# Patient Record
Sex: Male | Born: 1980
Health system: Southern US, Community
[De-identification: ages and names within clinical notes are randomized; demographics above are authoritative.]

## PROBLEM LIST (undated history)

## (undated) DIAGNOSIS — A630 Anogenital (venereal) warts: Secondary | ICD-10-CM

## (undated) DIAGNOSIS — Z21 Asymptomatic human immunodeficiency virus [HIV] infection status: Secondary | ICD-10-CM

## (undated) DIAGNOSIS — B977 Papillomavirus as the cause of diseases classified elsewhere: Secondary | ICD-10-CM

## (undated) DIAGNOSIS — B20 Human immunodeficiency virus [HIV] disease: Secondary | ICD-10-CM

## (undated) DIAGNOSIS — E079 Disorder of thyroid, unspecified: Secondary | ICD-10-CM

## (undated) HISTORY — DX: Disorder of thyroid, unspecified: E07.9

## (undated) HISTORY — DX: Papillomavirus as the cause of diseases classified elsewhere: B97.7

## (undated) HISTORY — DX: Human immunodeficiency virus (HIV) disease: B20

## (undated) HISTORY — DX: Asymptomatic human immunodeficiency virus (hiv) infection status: Z21

## (undated) HISTORY — DX: Anogenital (venereal) warts: A63.0

---

## 2015-05-14 ENCOUNTER — Telehealth: Payer: Self-pay

## 2015-05-14 NOTE — Telephone Encounter (Signed)
Patient contacted regarding new intake appointment. Date and time given. Information given regarding documents needed to qualify for financial eligibility.  Tammy K King, RN  

## 2015-05-24 ENCOUNTER — Ambulatory Visit: Payer: Self-pay

## 2015-05-31 ENCOUNTER — Ambulatory Visit: Payer: BLUE CROSS/BLUE SHIELD

## 2015-06-12 ENCOUNTER — Ambulatory Visit: Payer: BLUE CROSS/BLUE SHIELD

## 2015-06-12 ENCOUNTER — Other Ambulatory Visit: Payer: Self-pay

## 2015-06-12 DIAGNOSIS — B59 Pneumocystosis: Secondary | ICD-10-CM

## 2015-06-12 DIAGNOSIS — A539 Syphilis, unspecified: Secondary | ICD-10-CM

## 2015-06-12 DIAGNOSIS — R7989 Other specified abnormal findings of blood chemistry: Secondary | ICD-10-CM

## 2015-06-12 DIAGNOSIS — B20 Human immunodeficiency virus [HIV] disease: Secondary | ICD-10-CM

## 2015-06-12 DIAGNOSIS — R945 Abnormal results of liver function studies: Secondary | ICD-10-CM

## 2015-06-12 DIAGNOSIS — E0590 Thyrotoxicosis, unspecified without thyrotoxic crisis or storm: Secondary | ICD-10-CM

## 2015-06-12 LAB — CBC WITH DIFFERENTIAL/PLATELET
Basophils Absolute: 0 10*3/uL (ref 0.0–0.1)
Basophils Relative: 0 % (ref 0–1)
Eosinophils Absolute: 0.1 10*3/uL (ref 0.0–0.7)
Eosinophils Relative: 4 % (ref 0–5)
HCT: 38.3 % — ABNORMAL LOW (ref 39.0–52.0)
Hemoglobin: 12.5 g/dL — ABNORMAL LOW (ref 13.0–17.0)
Lymphocytes Relative: 38 % (ref 12–46)
Lymphs Abs: 0.8 10*3/uL (ref 0.7–4.0)
MCH: 25.2 pg — ABNORMAL LOW (ref 26.0–34.0)
MCHC: 32.6 g/dL (ref 30.0–36.0)
MCV: 77.2 fL — ABNORMAL LOW (ref 78.0–100.0)
MPV: 10.5 fL (ref 8.6–12.4)
Monocytes Absolute: 0.3 10*3/uL (ref 0.1–1.0)
Monocytes Relative: 13 % — ABNORMAL HIGH (ref 3–12)
Neutro Abs: 0.9 10*3/uL — ABNORMAL LOW (ref 1.7–7.7)
Neutrophils Relative %: 45 % (ref 43–77)
Platelets: 230 10*3/uL (ref 150–400)
RBC: 4.96 MIL/uL (ref 4.22–5.81)
RDW: 16.1 % — ABNORMAL HIGH (ref 11.5–15.5)
WBC: 2.1 10*3/uL — ABNORMAL LOW (ref 4.0–10.5)

## 2015-06-12 LAB — COMPLETE METABOLIC PANEL WITH GFR
ALT: 34 U/L (ref 9–46)
AST: 41 U/L — ABNORMAL HIGH (ref 10–40)
Albumin: 3.5 g/dL — ABNORMAL LOW (ref 3.6–5.1)
Alkaline Phosphatase: 72 U/L (ref 40–115)
BUN: 12 mg/dL (ref 7–25)
CO2: 28 mmol/L (ref 20–31)
Calcium: 8.8 mg/dL (ref 8.6–10.3)
Chloride: 101 mmol/L (ref 98–110)
Creat: 0.79 mg/dL (ref 0.60–1.35)
GFR, Est African American: >89 mL/min (ref >=60)
GFR, Est Non African American: >89 mL/min (ref >=60)
Glucose, Bld: 82 mg/dL (ref 65–99)
Potassium: 4 mmol/L (ref 3.5–5.3)
Sodium: 134 mmol/L — ABNORMAL LOW (ref 135–146)
Total Bilirubin: 0.3 mg/dL (ref 0.2–1.2)
Total Protein: 8.7 g/dL — ABNORMAL HIGH (ref 6.1–8.1)

## 2015-06-12 LAB — LIPID PANEL
Cholesterol: 108 mg/dL — ABNORMAL LOW (ref 125–200)
HDL: 44 mg/dL (ref >=40)
LDL Cholesterol: 54 mg/dL (ref <130)
Total CHOL/HDL Ratio: 2.5 Ratio (ref <=5.0)
Triglycerides: 48 mg/dL (ref <150)
VLDL: 10 mg/dL (ref <30)

## 2015-06-12 MED ORDER — TENOFOVIR DISOPROXIL FUMARATE 300 MG PO TABS: 300.0000 mg | ORAL_TABLET | Freq: Every day | ORAL | Status: DC

## 2015-06-12 MED ORDER — SULFAMETHOXAZOLE-TRIMETHOPRIM 800-160 MG PO TABS: 1.0000 | ORAL_TABLET | Freq: Every day | ORAL | Status: DC

## 2015-06-12 MED ORDER — ABACAVIR SULFATE 300 MG PO TABS: 300.0000 mg | ORAL_TABLET | Freq: Every day | ORAL | Status: DC

## 2015-06-12 MED ORDER — DARUNAVIR ETHANOLATE 800 MG PO TABS: 800.0000 mg | ORAL_TABLET | Freq: Every day | ORAL | Status: DC

## 2015-06-12 MED ORDER — RITONAVIR 100 MG PO TABS: 100.0000 mg | ORAL_TABLET | Freq: Every day | ORAL | Status: DC

## 2015-06-13 ENCOUNTER — Other Ambulatory Visit: Payer: Self-pay

## 2015-06-13 DIAGNOSIS — B20 Human immunodeficiency virus [HIV] disease: Secondary | ICD-10-CM

## 2015-06-13 LAB — URINE CYTOLOGY ANCILLARY ONLY
Chlamydia: NEGATIVE
Neisseria Gonorrhea: NEGATIVE

## 2015-06-13 LAB — RPR TITER: RPR Titer: 1:32 {titer} — AB

## 2015-06-13 LAB — HIV-1 RNA ULTRAQUANT REFLEX TO GENTYP+
HIV 1 RNA Quant: 24011 copies/mL — ABNORMAL HIGH (ref <20)
HIV-1 RNA Quant, Log: 4.38 Log copies/mL — ABNORMAL HIGH (ref <1.30)

## 2015-06-13 LAB — URINALYSIS
Bilirubin Urine: NEGATIVE
Glucose, UA: NEGATIVE
Hgb urine dipstick: NEGATIVE
Ketones, ur: NEGATIVE
Leukocytes, UA: NEGATIVE
Nitrite: NEGATIVE
Protein, ur: NEGATIVE
Specific Gravity, Urine: 1.022 (ref 1.001–1.035)
pH: 6 (ref 5.0–8.0)

## 2015-06-13 LAB — HEPATITIS B CORE ANTIBODY, TOTAL: Hep B Core Total Ab: REACTIVE — AB

## 2015-06-13 LAB — HEPATITIS A ANTIBODY, TOTAL: Hep A Total Ab: NONREACTIVE

## 2015-06-13 LAB — T-HELPER CELL (CD4) - (RCID CLINIC ONLY)
CD4 % Helper T Cell: 3 % — ABNORMAL LOW (ref 33–55)
CD4 T Cell Abs: 20 /uL — ABNORMAL LOW (ref 400–2700)

## 2015-06-13 LAB — HEPATITIS B SURFACE ANTIBODY,QUALITATIVE: Hep B S Ab: NEGATIVE

## 2015-06-13 LAB — HEPATITIS B SURFACE ANTIGEN: Hepatitis B Surface Ag: POSITIVE — AB

## 2015-06-13 LAB — HEPATITIS B SURF AG CONFIRMATION: Hepatitis B Surf Ag Confirmation: POSITIVE — AB

## 2015-06-13 LAB — HEPATITIS C ANTIBODY: HCV Ab: NEGATIVE

## 2015-06-13 LAB — RPR: RPR Ser Ql: REACTIVE — AB

## 2015-06-13 MED ORDER — ABACAVIR SULFATE 300 MG PO TABS: 300.0000 mg | ORAL_TABLET | Freq: Every day | ORAL | Status: DC

## 2015-06-13 NOTE — Addendum Note (Signed)
Addended by: Jennet Maduro D on: 06/13/2015 01:46 PM   Modules accepted: Orders

## 2015-06-13 NOTE — Telephone Encounter (Signed)
Fax received regarding abacavir . Quantity was not noted per request.  Resubmitted via electronic refills.   Laurell Josephs, RN

## 2015-06-14 LAB — QUANTIFERON TB GOLD ASSAY (BLOOD)
Interferon Gamma Release Assay: NEGATIVE
Mitogen value: 1.67 IU/mL
Quantiferon Nil Value: 0.06 IU/mL
Quantiferon Tb Ag Minus Nil Value: 0 IU/mL
TB Ag value: 0.06 IU/mL

## 2015-06-14 LAB — FLUORESCENT TREPONEMAL AB(FTA)-IGG-BLD: Fluorescent Treponemal ABS: REACTIVE — AB

## 2015-06-15 ENCOUNTER — Telehealth: Payer: Self-pay

## 2015-06-15 NOTE — Telephone Encounter (Signed)
Left message for pateint to call the office.   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  Patient will need Bicillin 2.3mil units  IM once weekly for weeks .

## 2015-06-18 ENCOUNTER — Ambulatory Visit (INDEPENDENT_AMBULATORY_CARE_PROVIDER_SITE_OTHER): Payer: BLUE CROSS/BLUE SHIELD | Admitting: *Deleted

## 2015-06-18 DIAGNOSIS — A539 Syphilis, unspecified: Secondary | ICD-10-CM

## 2015-06-18 MED ORDER — PENICILLIN G BENZATHINE 1200000 UNIT/2ML IM SUSP
1.2000 10*6.[IU] | Freq: Once | INTRAMUSCULAR | Status: AC
  Administered 2015-06-18: 1.2 10*6.[IU] via INTRAMUSCULAR

## 2015-06-20 LAB — HIV-1 GENOTYPR PLUS

## 2015-06-20 LAB — HLA B*5701: HLA-B*5701 w/rflx HLA-B High: NEGATIVE

## 2015-06-25 ENCOUNTER — Ambulatory Visit (INDEPENDENT_AMBULATORY_CARE_PROVIDER_SITE_OTHER): Payer: BLUE CROSS/BLUE SHIELD | Admitting: *Deleted

## 2015-06-25 DIAGNOSIS — A539 Syphilis, unspecified: Secondary | ICD-10-CM | POA: Diagnosis not present

## 2015-06-25 MED ORDER — PENICILLIN G BENZATHINE 1200000 UNIT/2ML IM SUSP
1.2000 10*6.[IU] | Freq: Once | INTRAMUSCULAR | Status: AC
  Administered 2015-06-25: 1.2 10*6.[IU] via INTRAMUSCULAR

## 2015-06-27 DIAGNOSIS — R7989 Other specified abnormal findings of blood chemistry: Secondary | ICD-10-CM | POA: Insufficient documentation

## 2015-06-27 DIAGNOSIS — R945 Abnormal results of liver function studies: Secondary | ICD-10-CM | POA: Insufficient documentation

## 2015-06-27 DIAGNOSIS — B59 Pneumocystosis: Secondary | ICD-10-CM | POA: Insufficient documentation

## 2015-06-27 DIAGNOSIS — E0590 Thyrotoxicosis, unspecified without thyrotoxic crisis or storm: Secondary | ICD-10-CM | POA: Insufficient documentation

## 2015-06-27 NOTE — Progress Notes (Signed)
Patient is transferring HIV care from [place]. He was referred by local primary care office.  He has been HIV positive since 2007 and possibly a few different regimens. Patient is not a good historian with medication list or medical history. He was not able to identify his current medication regimen on pill board and was not able to give general dates of treatment or diagnosis date.  He is not able to give a general idea of how long he may have been without medications.   His lack of interest is puzzling.   For a male of his age his responses are very immature.   I have no medical records to confirm his HIV regimen and will complete a medical record release.  The request will be as soon as possible  to verify medications and have documentation of medical history while patient is here.  Sexual history:  Same sex male partners and is versatile with anal intercourse.  1 year: 51 male partners   Lifetime partners: 8   He became sexually active at age 80  He meets some of his partners on [employer]  Internet sites and reports condom use as often.  No history of  sexual intercourse with prostitutes or IV drug use.   Medical records received from Dr Helyn Numbers via fax. Complete medical record to follow at later date.   Last HIV regimen:  Viread, Norvir, Prezista and Ziagen . Records verify history of non compliance with HIV medications possibly due to high co pays with insurance plan.  Last CD4: 81 on January 04, 2015  It is not clear how long he has been without medications.   I have concerns patient may not understand importance of  lab values and the effects this may have on his lively hood. He appears to be in a good mood, free from worries and has no complaints even after I explained lab results.   He is now insured. I have given him co pay cards with simple instruction on how to activate them. I have informed patient to call me if he has any issues with obtaining medications.    Medications sent to pharmacy along with Bactrim DS  He will need a pneumonia vaccine at next visit.   Laurell Josephs, RN

## 2015-07-02 ENCOUNTER — Ambulatory Visit (INDEPENDENT_AMBULATORY_CARE_PROVIDER_SITE_OTHER): Payer: BLUE CROSS/BLUE SHIELD | Admitting: *Deleted

## 2015-07-02 DIAGNOSIS — A539 Syphilis, unspecified: Secondary | ICD-10-CM

## 2015-07-02 MED ORDER — PENICILLIN G BENZATHINE 1200000 UNIT/2ML IM SUSP
1.2000 10*6.[IU] | Freq: Once | INTRAMUSCULAR | Status: AC
  Administered 2015-07-02: 1.2 10*6.[IU] via INTRAMUSCULAR

## 2015-07-12 ENCOUNTER — Encounter: Payer: Self-pay | Admitting: Internal Medicine

## 2015-07-12 ENCOUNTER — Ambulatory Visit (INDEPENDENT_AMBULATORY_CARE_PROVIDER_SITE_OTHER): Payer: BLUE CROSS/BLUE SHIELD | Admitting: Internal Medicine

## 2015-07-12 VITALS — BP 129/81 | HR 77 | Temp 97.4°F | Wt 205.0 lb

## 2015-07-12 DIAGNOSIS — A528 Late syphilis, latent: Secondary | ICD-10-CM

## 2015-07-12 DIAGNOSIS — D649 Anemia, unspecified: Secondary | ICD-10-CM

## 2015-07-12 DIAGNOSIS — B20 Human immunodeficiency virus [HIV] disease: Secondary | ICD-10-CM | POA: Insufficient documentation

## 2015-07-12 DIAGNOSIS — B181 Chronic viral hepatitis B without delta-agent: Secondary | ICD-10-CM | POA: Diagnosis not present

## 2015-07-12 DIAGNOSIS — A6000 Herpesviral infection of urogenital system, unspecified: Secondary | ICD-10-CM | POA: Insufficient documentation

## 2015-07-12 MED ORDER — ELVITEG-COBIC-EMTRICIT-TENOFAF 150-150-200-10 MG PO TABS: 1.0000 | ORAL_TABLET | Freq: Every day | ORAL | Status: DC

## 2015-07-12 MED ORDER — DARUNAVIR ETHANOLATE 800 MG PO TABS: 800.0000 mg | ORAL_TABLET | Freq: Every day | ORAL | Status: DC

## 2015-07-12 MED FILL — GENVOYA TABLET: 150-150-200 | 30 days supply | Qty: 30 | Fill #0

## 2015-07-12 NOTE — Assessment & Plan Note (Addendum)
His HIV infection is not well controlled and he seems somewhat disengaged with his care. I had a long talk with him today about his CD4 count and viral load and the importance of better adherence. A genotype resistance assay done in 2012 revealed extensive NRTI resistance. He met with our ID pharmacist, [facility] today, and we decided to simplify and improve his regimen to [place]. He will continue pneumocystis prophylaxis. I will recheck lab work today and see him back in one month. I have encouraged him to set a goal of not missing any doses in the next month.

## 2015-07-12 NOTE — Assessment & Plan Note (Signed)
I will check his hepatitis B e antigen and antibody and viral load today.

## 2015-07-12 NOTE — Progress Notes (Signed)
HPI: Daniel OldsDemetrius Dunlap is a 35 y.o. male who his here for his HIV care after being tx.   Allergies: No Known Allergies  Vitals:    Past Medical History: Past Medical History  Diagnosis Date  . HIV infection (HCC)   . Thyroid disease     Social History: Social History   Social History  . Marital Status: Single    Spouse Name: N/A  . Number of Children: N/A  . Years of Education: N/A   Social History Main Topics  . Smoking status: Never Smoker   . Smokeless tobacco: None  . Alcohol Use: 0.6 oz/week    1 Glasses of wine per week  . Drug Use: No  . Sexual Activity:    Partners: Female, Male    Birth Control/ Protection: None   Other Topics Concern  . None   Social History Narrative    Previous Regimen:   Current Regimen: TDF/ABC/DRV/r  Labs: HIV 1 RNA QUANT (copies/mL)  Date Value  06/12/2015 24011*   CD4 T CELL ABS (/uL)  Date Value  06/12/2015 20*   HEP B S AB (no units)  Date Value  06/12/2015 NEG   HEPATITIS B SURFACE AG (no units)  Date Value  06/12/2015 POSITIVE*   HCV AB (no units)  Date Value  06/12/2015 NEGATIVE    CrCl: CrCl cannot be calculated (Unknown ideal weight.).  Lipids:    Component Value Date/Time   CHOL 108* 06/12/2015 1128   TRIG 48 06/12/2015 1128   HDL 44 06/12/2015 1128   CHOLHDL 2.5 06/12/2015 1128   VLDL 10 06/12/2015 1128   LDLCALC 54 06/12/2015 1128  HIV Genotype Composite Data Genotype Dates:   Mutations in Bold impact drug susceptibility RT Mutations K70ER, M184V, Y188L  PI Mutations None  Integrase Mutations    Interpretation of Genotype Data per [employer] HIV Database Nucleoside RTIs  abacavir (ABC) Intermediate Resistance zidovudine (AZT) Low-Level Resistance emtricitabine (FTC) High-Level Resistance lamivudine (3TC) High-Level Resistance tenofovir (TDF) Low-Level Resistance   Non-Nucleoside RTIs  efavirenz (EFV) High-Level Resistance etravirine (ETR) Potential Low-Level Resistance nevirapine  (NVP) High-Level Resistance rilpivirine (RPV) High-Level Resistance   Protease Inhibitors     Integrase Inhibitors      Assessment:  [name] is here after he being tx to cont his care for HIV. He has been on ATP in past for his HIV and has had poor adherence to it. He has developed significant mutations listed in the table above. He is currently on ABC/DRV/r/TDF. He also has chronic hep B. I really stressed to him about the limited options we have left to treat his HIV and showed him his current resistance profile. He has been taking ABC 300mg  qday rather than 600mg  qday. I suspected that he has developed full resistance to abacavir now since that genotype was in 2012. After discussing it with Dr. Orvan Falconerampbell, we are going to use Genvoya + DRV due to TAF for his hep B infection.  Recommendations:  Genvoya 1 PO qday Prezista 800mg  PO qday Labs today Rx sent to [facility]  Pham, Minh Quang, PharmD Clinical Infectious Disease Pharmacist [facility] 07/12/2015, 1:56 PM

## 2015-07-12 NOTE — Assessment & Plan Note (Signed)
I talked him about the importance of limiting the number of partners he has, careful partners collection and correct consistent use of condoms.

## 2015-07-12 NOTE — Progress Notes (Signed)
Patient Active Problem List   Diagnosis Date Noted  . HIV disease (Daniel Dunlap) 07/12/2015    Priority: High  . Chronic hepatitis B (Daniel Dunlap) 07/12/2015    Priority: Medium  . Late latent syphilis 07/12/2015  . Normocytic anemia 07/12/2015  . Genital herpes 07/12/2015  . Hyperthyroidism 06/27/2015  . Abnormal liver function test 06/27/2015  . Pneumocystis jiroveci pneumonia (Daniel Dunlap) 06/27/2015    Patient's Medications  New Prescriptions   DARUNAVIR ETHANOLATE (PREZISTA) 800 MG TABLET    Take 1 tablet (800 mg total) by mouth daily with breakfast.   ELVITEGRAVIR-COBICISTAT-EMTRICITABINE-TENOFOVIR (GENVOYA) 150-150-200-10 MG TABS TABLET    Take 1 tablet by mouth daily with breakfast.  Previous Medications   SULFAMETHOXAZOLE-TRIMETHOPRIM (BACTRIM DS,SEPTRA DS) 800-160 MG TABLET    Take 1 tablet by mouth daily.  Modified Medications   No medications on file  Discontinued Medications   ABACAVIR (ZIAGEN) 300 MG TABLET    Take 1 tablet (300 mg total) by mouth daily.   DARUNAVIR ETHANOLATE (PREZISTA) 800 MG TABLET    Take 1 tablet (800 mg total) by mouth daily with breakfast.   RITONAVIR (NORVIR) 100 MG TABS TABLET    Take 1 tablet (100 mg total) by mouth daily with breakfast.   TENOFOVIR (VIREAD) 300 MG TABLET    Take 1 tablet (300 mg total) by mouth daily.    Subjective: Daniel Dunlap is in for his initial visit to establish ongoing care for his HIV infection. He is exclusively day male and was diagnosed about 5 years ago when he developed pneumocystis pneumonia. 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 When he was first diagnosed he was started on Atripla but had to stop it after one year when he was told that it quit working. He recalls missing doses frequently. He was switched to his current regimen of Viread, Ziagen, Prezista and Norvir. He has also had a great deal of difficulty taking  that over the years. He had difficulty related to his pharmacy, co-pay cards, changing jobs and losing insurance. He has insurance with his current job and restarted his medications about one month ago. He normally takes them just before bedtime. He has missed 5 or 6 doses in the past month when he was working a late shift and did not take them. He has not thought of taking a pocket pill container to work with him. He recalls being told that his blood work was improving at the time of his last visit and [place] in September however labs there showed just the opposite. His CD4 count was down to 33 and his viral load was up to 16,600.  He is not aware of having any complications of his HIV infection other than his initial pneumocystis pneumonia. Since learning of his infection he shared the results with his mother and father and one cousin and finds them to be supportive. He has 2 older brothers but has chosen not to tell them. He lives alone.[name] He states that he is currently not in a relationship but he has had 2 male contacts in the past 6 months. He states that they always use condoms. He has a history of syphilis and genital herpes. Records indicate that he has hepatitis B surface antigen positive but records from [place] do not include any other hepatitis B data.  He does not  smoke cigarettes, use other tobacco products or any street drugs. He states that he does drink alcohol but only in moderation.   Review of Systems: Review of Systems  Constitutional: Positive for weight loss. Negative for fever, chills, malaise/fatigue and diaphoresis.  HENT: Negative for sore throat.   Respiratory: Negative for cough, sputum production and shortness of breath.   Cardiovascular: Negative for chest pain.  Gastrointestinal: Negative for nausea, vomiting, abdominal pain and diarrhea.  Genitourinary: Negative for dysuria.  Musculoskeletal: Negative for myalgias and joint pain.  Skin: Negative for  rash.  Neurological: Negative for dizziness.  Psychiatric/Behavioral: Negative for depression and substance abuse. The patient is not nervous/anxious.     Past Medical History  Diagnosis Date  . HIV infection (Daniel Dunlap)   . Thyroid disease     Social History  Substance Use Topics  . Smoking status: Never Smoker   . Smokeless tobacco: Never Used  . Alcohol Use: 0.6 oz/week    1 Glasses of wine per week    No family history on file.  No Known Allergies  Objective:  Filed Vitals:   07/12/15 1411  BP: 129/81  Pulse: 77  Temp: 97.4 F (36.3 C)  TempSrc: Oral  Weight: 205 lb (92.987 kg)   There is no height on file to calculate BMI.  Physical Exam  Constitutional: He is oriented to person, place, and time.  He is well dressed and in no distress.  HENT:  Mouth/Throat: No oropharyngeal exudate.  His teeth are in excellent condition.  Eyes: Conjunctivae are normal.  Cardiovascular: Normal rate and regular rhythm.   No murmur heard. Pulmonary/Chest: Breath sounds normal.  Abdominal: Soft. He exhibits no mass. There is no tenderness.  Musculoskeletal: Normal range of motion.  Neurological: He is alert and oriented to person, place, and time.  Skin: No rash noted.  Psychiatric: Mood and affect normal.    Lab Results Lab Results  Component Value Date   WBC 2.1* 06/12/2015   HGB 12.5* 06/12/2015   HCT 38.3* 06/12/2015   MCV 77.2* 06/12/2015   PLT 230 06/12/2015    Lab Results  Component Value Date   CREATININE 0.79 06/12/2015   BUN 12 06/12/2015   NA 134* 06/12/2015   K 4.0 06/12/2015   CL 101 06/12/2015   CO2 28 06/12/2015    Lab Results  Component Value Date   ALT 34 06/12/2015   AST 41* 06/12/2015   ALKPHOS 72 06/12/2015   BILITOT 0.3 06/12/2015    Lab Results  Component Value Date   CHOL 108* 06/12/2015   HDL 44 06/12/2015   LDLCALC 54 06/12/2015   TRIG 48 06/12/2015   CHOLHDL 2.5 06/12/2015    Lab Results HIV 1 RNA QUANT (copies/mL)  Date  Value  06/12/2015 24011*   CD4 T CELL ABS (/uL)  Date Value  06/12/2015 20*      Problem List Items Addressed This Visit      High   HIV disease (Daniel Dunlap) - Primary    His HIV infection is not well controlled and he seems somewhat disengaged with his care. I had a long talk with him today about his CD4 count and viral load and the importance of better adherence. A genotype resistance assay done in 2012 revealed extensive NRTI resistance. He met with our ID pharmacist, [facility] today, and we decided to simplify and improve his regimen to [place]. He will continue pneumocystis prophylaxis. I will recheck lab work today and see him  back in one month. I have encouraged him to set a goal of not missing any doses in the next month.      Relevant Medications   elvitegravir-cobicistat-emtricitabine-tenofovir (GENVOYA) 150-150-200-10 MG TABS tablet   Darunavir Ethanolate (PREZISTA) 800 MG tablet   Other Relevant Orders   T-helper cell (CD4)- ([facility] clinic only)   HIV 1 RNA quant-no reflex-bld   Hepatitis B DNA, ultraquantitative, PCR   Hepatitis B e antibody   Hepatitis B e antigen     Medium   Chronic hepatitis B ([place])    I will check his hepatitis B e antigen and antibody and viral load today.      Relevant Medications   elvitegravir-cobicistat-emtricitabine-tenofovir (GENVOYA) 150-150-200-10 MG TABS tablet   Darunavir Ethanolate (PREZISTA) 800 MG tablet     Unprioritized   Genital herpes   Relevant Medications   elvitegravir-cobicistat-emtricitabine-tenofovir (GENVOYA) 150-150-200-10 MG TABS tablet   Darunavir Ethanolate (PREZISTA) 800 MG tablet   Late latent syphilis    I talked him about the importance of limiting the number of partners he has, careful partners collection and correct consistent use of condoms.      Relevant Medications   elvitegravir-cobicistat-emtricitabine-tenofovir (GENVOYA) 150-150-200-10 MG TABS tablet   Darunavir Ethanolate (PREZISTA) 800 MG  tablet   Normocytic anemia        Michel Bickers, MD [facility] [phone] pager   [phone] cell 07/12/2015, 5:35 PM

## 2015-07-13 LAB — HIV-1 RNA QUANT-NO REFLEX-BLD
HIV 1 RNA Quant: 23576 copies/mL — ABNORMAL HIGH (ref <20)
HIV-1 RNA Quant, Log: 4.37 Log copies/mL — ABNORMAL HIGH (ref <1.30)

## 2015-07-13 LAB — T-HELPER CELL (CD4) - (RCID CLINIC ONLY)
CD4 % Helper T Cell: 4 % — ABNORMAL LOW (ref 33–55)
CD4 T Cell Abs: 30 /uL — ABNORMAL LOW (ref 400–2700)

## 2015-07-16 LAB — HEPATITIS B E ANTIBODY: Hepatitis Be Antibody: NONREACTIVE

## 2015-07-16 LAB — HEPATITIS B E ANTIGEN: Hepatitis Be Antigen: REACTIVE — AB

## 2015-07-19 LAB — HEPATITIS B DNA, ULTRAQUANTITATIVE, PCR
Hepatitis B DNA (Calc): 6.45 Log IU/mL — ABNORMAL HIGH (ref <1.30)
Hepatitis B DNA: 2842925 IU/mL — ABNORMAL HIGH (ref <20)

## 2015-08-07 MED FILL — GENVOYA TABLET: 150-150-200 | 30 days supply | Qty: 30 | Fill #1

## 2015-08-07 MED FILL — PREZISTA 800 MG TABS: 800 | 30 days supply | Qty: 30 | Fill #0

## 2015-09-05 MED FILL — GENVOYA TABLET: 150-150-200 | 30 days supply | Qty: 30 | Fill #2

## 2015-09-05 MED FILL — PREZISTA 800 MG TABS: 800 | 30 days supply | Qty: 30 | Fill #1

## 2015-10-04 MED FILL — GENVOYA TABLET: 150-150-200 | 30 days supply | Qty: 30 | Fill #3

## 2015-10-04 MED FILL — PREZISTA 800 MG TABS: 800 | 30 days supply | Qty: 30 | Fill #2

## 2015-11-02 MED FILL — PREZISTA 800 MG TABS: 800 | 30 days supply | Qty: 30 | Fill #3

## 2015-11-02 MED FILL — GENVOYA TABLET: 150-150-200 | 30 days supply | Qty: 30 | Fill #4

## 2015-11-13 ENCOUNTER — Other Ambulatory Visit: Payer: BLUE CROSS/BLUE SHIELD

## 2015-11-27 ENCOUNTER — Ambulatory Visit: Payer: BLUE CROSS/BLUE SHIELD | Admitting: Internal Medicine

## 2015-12-03 MED FILL — GENVOYA TABLET: 150-150-200 | 30 days supply | Qty: 30 | Fill #5

## 2016-01-01 ENCOUNTER — Other Ambulatory Visit: Payer: Self-pay | Admitting: Pharmacist Clinician (PhC)/ Clinical Pharmacy Specialist

## 2016-01-01 MED ORDER — ELVITEG-COBIC-EMTRICIT-TENOFAF 150-150-200-10 MG PO TABS
1.0000 | ORAL_TABLET | Freq: Every day | ORAL | 5 refills | Status: DC
Start: 2016-01-01 — End: 2016-07-28

## 2016-01-01 MED ORDER — DARUNAVIR ETHANOLATE 800 MG PO TABS: 800.0000 mg | ORAL_TABLET | Freq: Every day | ORAL | 5 refills | Status: DC

## 2016-01-07 ENCOUNTER — Telehealth: Payer: Self-pay | Admitting: Pharmacy Technician

## 2016-01-07 NOTE — Telephone Encounter (Signed)
He said he will come in to sign [facility] application and bring 2 pay stubs, to get HIV meds until new insurance starts.

## 2016-01-10 ENCOUNTER — Telehealth: Payer: Self-pay | Admitting: Pharmacy Technician

## 2016-01-30 ENCOUNTER — Other Ambulatory Visit: Payer: BLUE CROSS/BLUE SHIELD

## 2016-01-30 DIAGNOSIS — B20 Human immunodeficiency virus [HIV] disease: Secondary | ICD-10-CM

## 2016-01-30 DIAGNOSIS — Z21 Asymptomatic human immunodeficiency virus [HIV] infection status: Secondary | ICD-10-CM

## 2016-01-30 LAB — COMPREHENSIVE METABOLIC PANEL
ALT: 233 U/L — ABNORMAL HIGH (ref 9–46)
AST: 183 U/L — ABNORMAL HIGH (ref 10–40)
Albumin: 3.6 g/dL (ref 3.6–5.1)
Alkaline Phosphatase: 71 U/L (ref 40–115)
BUN: 11 mg/dL (ref 7–25)
CO2: 28 mmol/L (ref 20–31)
Calcium: 8.9 mg/dL (ref 8.6–10.3)
Chloride: 100 mmol/L (ref 98–110)
Creat: 1.02 mg/dL (ref 0.60–1.35)
Glucose, Bld: 99 mg/dL (ref 65–99)
Potassium: 3.8 mmol/L (ref 3.5–5.3)
Sodium: 135 mmol/L (ref 135–146)
Total Bilirubin: 0.4 mg/dL (ref 0.2–1.2)
Total Protein: 8.5 g/dL — ABNORMAL HIGH (ref 6.1–8.1)

## 2016-01-30 MED FILL — GENVOYA TABLET: 150-150-200 | 30 days supply | Qty: 30 | Fill #6

## 2016-01-30 MED FILL — PREZISTA 800 MG TABS: 800 | 30 days supply | Qty: 30 | Fill #4

## 2016-01-31 ENCOUNTER — Telehealth: Payer: Self-pay | Admitting: *Deleted

## 2016-01-31 LAB — CBC WITH DIFFERENTIAL/PLATELET
Basophils Absolute: 0 cells/uL (ref 0–200)
Basophils Relative: 0 %
Eosinophils Absolute: 162 cells/uL (ref 15–500)
Eosinophils Relative: 6 %
HCT: 40.6 % (ref 38.5–50.0)
Hemoglobin: 13.3 g/dL (ref 13.2–17.1)
Lymphocytes Relative: 48 %
Lymphs Abs: 1296 cells/uL (ref 850–3900)
MCH: 27.4 pg (ref 27.0–33.0)
MCHC: 32.8 g/dL (ref 32.0–36.0)
MCV: 83.7 fL (ref 80.0–100.0)
MPV: 11.6 fL (ref 7.5–12.5)
Monocytes Absolute: 891 cells/uL (ref 200–950)
Monocytes Relative: 33 %
Neutro Abs: 351 cells/uL — CL (ref 1500–7800)
Neutrophils Relative %: 13 %
Platelets: 223 10*3/uL (ref 140–400)
RBC: 4.85 MIL/uL (ref 4.20–5.80)
RDW: 15.7 % — ABNORMAL HIGH (ref 11.0–15.0)
WBC: 2.7 10*3/uL — ABNORMAL LOW (ref 3.8–10.8)

## 2016-01-31 LAB — T-HELPER CELL (CD4) - (RCID CLINIC ONLY)
CD4 % Helper T Cell: 7 % — ABNORMAL LOW (ref 33–55)
CD4 T Cell Abs: 100 /uL — ABNORMAL LOW (ref 400–2700)

## 2016-01-31 NOTE — Telephone Encounter (Signed)
Call from [facility] with a critical low Neutro Abs of 351. Please advise

## 2016-01-31 NOTE — Telephone Encounter (Signed)
His neutropenia is unlikely to cause him any problems. I would like to wait and see what his CD4 count is. If it is over 200 I will stop trimethoprim sulfamethoxazole. If it is still below 200 I will change his trimethoprim sulfamethoxazole to one double strength tablet every Monday, Wednesday, Friday.

## 2016-02-01 LAB — HIV-1 RNA QUANT-NO REFLEX-BLD
HIV 1 RNA Quant: <20 copies/mL (ref <20)
HIV-1 RNA Quant, Log: <1.3 Log copies/mL (ref <1.30)

## 2016-02-13 ENCOUNTER — Ambulatory Visit: Payer: BLUE CROSS/BLUE SHIELD | Admitting: Internal Medicine

## 2016-02-19 ENCOUNTER — Ambulatory Visit: Payer: Self-pay | Admitting: Internal Medicine

## 2016-02-26 ENCOUNTER — Ambulatory Visit (INDEPENDENT_AMBULATORY_CARE_PROVIDER_SITE_OTHER): Payer: BLUE CROSS/BLUE SHIELD | Admitting: Internal Medicine

## 2016-02-26 ENCOUNTER — Encounter: Payer: Self-pay | Admitting: Internal Medicine

## 2016-02-26 VITALS — BP 127/83 | HR 64 | Temp 98.6°F | Ht 70.0 in | Wt 223.5 lb

## 2016-02-26 DIAGNOSIS — B181 Chronic viral hepatitis B without delta-agent: Secondary | ICD-10-CM | POA: Diagnosis not present

## 2016-02-26 DIAGNOSIS — Z23 Encounter for immunization: Secondary | ICD-10-CM | POA: Diagnosis not present

## 2016-02-26 DIAGNOSIS — B20 Human immunodeficiency virus [HIV] disease: Secondary | ICD-10-CM

## 2016-02-26 DIAGNOSIS — A528 Late syphilis, latent: Secondary | ICD-10-CM

## 2016-02-26 MED ORDER — SULFAMETHOXAZOLE-TRIMETHOPRIM 800-160 MG PO TABS: 1.0000 | ORAL_TABLET | Freq: Every day | ORAL | 11 refills | Status: DC

## 2016-02-26 MED FILL — SULFAMETHOXAZOLE/TMP DS TAB: 800-160 | 30 days supply | Qty: 30 | Fill #0

## 2016-02-26 NOTE — Assessment & Plan Note (Signed)
He is doing very well with his new antiretroviral regimen and his adherence appears to be very good. His virus is now suppressed to undetectable levels and he started to have some CD4 reconstitution. He does need to restart pneumocystis prophylaxis with trimethoprim sulfamethoxazole. He will continue Genvoya and Prezista and follow-up in 3 months.

## 2016-02-26 NOTE — Assessment & Plan Note (Signed)
He received treatment for late latent syphilis in February. I will repeat an RPR today.

## 2016-02-26 NOTE — Assessment & Plan Note (Signed)
His hepatitis B viral load was elevated at the time of his last visit. He told me at that time that he had been off of his medications and had not restarted taking Viread, Ziagen, Prezista and Norvir one month before his blood work was drawn. I will repeat his hepatitis B viral load again today. I will also check an INR, alpha-fetoprotein and fibrosure assay. He will follow-up in one month.

## 2016-02-26 NOTE — Progress Notes (Signed)
Patient Active Problem List   Diagnosis Date Noted  . HIV disease (HCC) 07/12/2015    Priority: High  . Chronic hepatitis B (HCC) 07/12/2015    Priority: Medium  . Late latent syphilis 07/12/2015  . Normocytic anemia 07/12/2015  . Genital herpes 07/12/2015  . Hyperthyroidism 06/27/2015  . Abnormal liver function test 06/27/2015  . Pneumocystis jiroveci pneumonia (HCC) 06/27/2015    Patient's Medications  New Prescriptions   No medications on file  Previous Medications   DARUNAVIR ETHANOLATE (PREZISTA) 800 MG TABLET    Take 1 tablet (800 mg total) by mouth daily with breakfast.   ELVITEGRAVIR-COBICISTAT-EMTRICITABINE-TENOFOVIR (GENVOYA) 150-150-200-10 MG TABS TABLET    Take 1 tablet by mouth daily with breakfast.  Modified Medications   Modified Medication Previous Medication   SULFAMETHOXAZOLE-TRIMETHOPRIM (BACTRIM DS,SEPTRA DS) 800-160 MG TABLET sulfamethoxazole-trimethoprim (BACTRIM DS,SEPTRA DS) 800-160 MG tablet      Take 1 tablet by mouth daily.    Take 1 tablet by mouth daily.  Discontinued Medications   DARUNAVIR ETHANOLATE (PREZISTA) 800 MG TABLET    Take 1 tablet (800 mg total) by mouth daily with breakfast.   ELVITEGRAVIR-COBICISTAT-EMTRICITABINE-TENOFOVIR (GENVOYA) 150-150-200-10 MG TABS TABLET    Take 1 tablet by mouth daily with breakfast.    Subjective: Daniel Dunlap is in for his first follow-up visit since his initial intake in March. He states that he's had difficulty getting back because he was out of town in [place] working for a while it moved back here in changed jobs. He says that he is doing very well and feeling better. He started on [place] and Prezista after his visit in March. He has had no problems obtaining his medications with the help of his insurance. He took trimethoprim sulfamethoxazole for one month but then his prescription ran out. 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 He does not love his work but enjoys  getting his paycheck. He is not in a relationship and has not been sexually active since his last visit.   Review of Systems: Review of Systems  Constitutional: Negative for chills, diaphoresis, fever, malaise/fatigue and weight loss.  HENT: Negative for sore throat.   Respiratory: Negative for cough, sputum production and shortness of breath.   Cardiovascular: Negative for chest pain.  Gastrointestinal: Negative for abdominal pain, diarrhea, heartburn, nausea and vomiting.  Genitourinary: Negative for dysuria and frequency.  Musculoskeletal: Negative for joint pain and myalgias.  Skin: Negative for rash.  Neurological: Negative for dizziness and headaches.  Psychiatric/Behavioral: Negative for depression and substance abuse. The patient is not nervous/anxious.     Past Medical History:  Diagnosis Date  . HIV infection (HCC)   . Thyroid disease     Social History  Substance Use Topics  . Smoking status: Never Smoker  . Smokeless tobacco: Never Used  . Alcohol use 0.6 oz/week    1 Glasses of wine per week    No family history on file.  No Known Allergies  Objective:  Vitals:   02/26/16 1616  BP: 127/83  Pulse: 64  Temp: 98.6 F (37 C)  TempSrc: Oral  Weight: 223 lb 8 oz (101.4 kg)  Height: 5\' 10"  (1.778 m)   Body mass index is 32.07 kg/m.  Physical Exam  Constitutional: He is oriented to person, place, and time.  He is smiling and in good spirits.  HENT:  Mouth/Throat: No oropharyngeal exudate.  Eyes: Conjunctivae are normal.  Cardiovascular:  Normal rate and regular rhythm.   No murmur heard. Pulmonary/Chest: Effort normal and breath sounds normal. He has no wheezes. He has no rales.  Abdominal: Soft. He exhibits no mass. There is no tenderness.  Musculoskeletal: Normal range of motion. He exhibits no edema or tenderness.  Neurological: He is alert and oriented to person, place, and time. Gait normal.  Skin: No rash noted.  Psychiatric: Mood and affect  normal.    Lab Results Lab Results  Component Value Date   WBC 2.7 (L) 01/30/2016   HGB 13.3 01/30/2016   HCT 40.6 01/30/2016   MCV 83.7 01/30/2016   PLT 223 01/30/2016    Lab Results  Component Value Date   CREATININE 1.02 01/30/2016   BUN 11 01/30/2016   NA 135 01/30/2016   K 3.8 01/30/2016   CL 100 01/30/2016   CO2 28 01/30/2016    Lab Results  Component Value Date   ALT 233 (H) 01/30/2016   AST 183 (H) 01/30/2016   ALKPHOS 71 01/30/2016   BILITOT 0.4 01/30/2016    Lab Results  Component Value Date   CHOL 108 (L) 06/12/2015   HDL 44 06/12/2015   LDLCALC 54 06/12/2015   TRIG 48 06/12/2015   CHOLHDL 2.5 06/12/2015   HIV 1 RNA Quant (copies/mL)  Date Value  01/30/2016 <20  07/12/2015 23,576 (H)  06/12/2015 24,011 (H)   CD4 T Cell Abs (/uL)  Date Value  01/30/2016 100 (L)  07/12/2015 30 (L)  06/12/2015 20 (L)   Hepatitis B DNA viral load 07/12/2015: 1,610,960 Hepatitis B E antigen 07/12/2015: Reactive  RPR 06/12/2015: Reactive at 1:32   Problem List Items Addressed This Visit      High   HIV disease (HCC)    He is doing very well with his new antiretroviral regimen and his adherence appears to be very good. His virus is now suppressed to undetectable levels and he started to have some CD4 reconstitution. He does need to restart pneumocystis prophylaxis with trimethoprim sulfamethoxazole. He will continue Genvoya and Prezista and follow-up in 3 months.      Relevant Medications   sulfamethoxazole-trimethoprim (BACTRIM DS,SEPTRA DS) 800-160 MG tablet     Medium   Chronic hepatitis B (HCC)    His hepatitis B viral load was elevated at the time of his last visit. He told me at that time that he had been off of his medications and had not restarted taking Viread, Ziagen, Prezista and Norvir one month before his blood work was drawn. I will repeat his hepatitis B viral load again today. I will also check an INR, alpha-fetoprotein and fibrosure assay. He will  follow-up in one month.      Relevant Medications   sulfamethoxazole-trimethoprim (BACTRIM DS,SEPTRA DS) 800-160 MG tablet   Other Relevant Orders   Hepatitis B DNA, ultraquantitative, PCR   RPR   INR/PT   Liver Fibrosis Panel   AFP tumor marker     Unprioritized   Late latent syphilis    He received treatment for late latent syphilis in February. I will repeat an RPR today.      Relevant Medications   sulfamethoxazole-trimethoprim (BACTRIM DS,SEPTRA DS) 800-160 MG tablet    Other Visit Diagnoses    Need for prophylactic vaccination against Streptococcus pneumoniae (pneumococcus)    -  Primary   Relevant Orders   Pneumococcal polysaccharide vaccine 23-valent greater than or equal to 2yo subcutaneous/IM (Completed)   Need for prophylactic vaccination and inoculation against viral hepatitis  Relevant Orders   Hepatitis A vaccine adult IM (Completed)   Encounter for immunization       Relevant Orders   Flu Vaccine QUAD 36+ mos IM (Completed)        Cliffton AstersJohn Campbell, MD [facility] [phone] pager   [phone] cell 02/26/2016, 5:36 PM

## 2016-02-27 MED FILL — PREZISTA 800 MG TABS: 800 | 30 days supply | Qty: 30 | Fill #5

## 2016-02-27 MED FILL — GENVOYA TABLET: 150-150-200 | 30 days supply | Qty: 30 | Fill #7

## 2016-03-25 ENCOUNTER — Ambulatory Visit: Payer: BLUE CROSS/BLUE SHIELD | Admitting: Internal Medicine

## 2016-03-27 MED FILL — GENVOYA TABLET: 150-150-200 | 30 days supply | Qty: 30 | Fill #8

## 2016-03-27 MED FILL — PREZISTA 800 MG TABS: 800 | 30 days supply | Qty: 30 | Fill #6

## 2016-03-27 MED FILL — SULFAMETHOXAZOLE/TMP DS TAB: 800-160 | 30 days supply | Qty: 30 | Fill #1

## 2016-04-25 MED FILL — GENVOYA TABLET: 150-150-200 | 30 days supply | Qty: 30 | Fill #9

## 2016-04-25 MED FILL — SULFAMETHOXAZOLE/TMP DS TAB: 800-160 | 30 days supply | Qty: 30 | Fill #2

## 2016-04-25 MED FILL — PREZISTA 800 MG TABS: 800 | 30 days supply | Qty: 30 | Fill #7

## 2016-05-21 MED FILL — SULFAMETHOXAZOLE/TMP DS TAB: 800-160 | 30 days supply | Qty: 30 | Fill #3 | Status: TO

## 2016-05-21 MED FILL — GENVOYA TABLET: 150-150-200 | 30 days supply | Qty: 30 | Fill #10 | Status: TO

## 2016-05-21 MED FILL — PREZISTA 800 MG TABS: 800 | 30 days supply | Qty: 30 | Fill #8 | Status: TO

## 2016-05-22 ENCOUNTER — Encounter: Payer: Self-pay | Admitting: Internal Medicine

## 2016-05-29 ENCOUNTER — Other Ambulatory Visit: Payer: BLUE CROSS/BLUE SHIELD

## 2016-05-29 DIAGNOSIS — B181 Chronic viral hepatitis B without delta-agent: Secondary | ICD-10-CM

## 2016-05-31 LAB — HEPATITIS B DNA, ULTRAQUANTITATIVE, PCR
Hepatitis B DNA (Calc): <1.3 Log IU/mL — ABNORMAL HIGH (ref <1.30)
Hepatitis B DNA: <20 IU/mL — ABNORMAL HIGH (ref <20)

## 2016-06-10 NOTE — Telephone Encounter (Signed)
l °

## 2016-06-12 ENCOUNTER — Encounter: Payer: Self-pay | Admitting: Internal Medicine

## 2016-06-12 ENCOUNTER — Ambulatory Visit: Payer: BLUE CROSS/BLUE SHIELD | Admitting: Internal Medicine

## 2016-07-01 MED FILL — GENVOYA TABLET: 150-150-200 | 30 days supply | Qty: 30 | Fill #0

## 2016-07-01 MED FILL — SULFAMETHOXAZOLE/TMP DS TAB: 800-160 | 30 days supply | Qty: 30 | Fill #0

## 2016-07-01 MED FILL — PREZISTA 800 MG TABS: 800 | 30 days supply | Qty: 30 | Fill #0

## 2016-07-28 ENCOUNTER — Other Ambulatory Visit: Payer: Self-pay | Admitting: Pharmacist Clinician (PhC)/ Clinical Pharmacy Specialist

## 2016-07-28 MED ORDER — DARUNAVIR ETHANOLATE 800 MG PO TABS: 800.0000 mg | ORAL_TABLET | Freq: Every day | ORAL | 1 refills | Status: DC

## 2016-07-28 MED ORDER — ELVITEG-COBIC-EMTRICIT-TENOFAF 150-150-200-10 MG PO TABS: 1.0000 | ORAL_TABLET | Freq: Every day | ORAL | 1 refills | Status: DC

## 2016-07-28 MED FILL — SULFAMETHOXAZOLE/TMP DS TAB: 800-160 | 30 days supply | Qty: 30 | Fill #1

## 2016-07-28 MED FILL — PREZISTA 800 MG TABS: 800 | 30 days supply | Qty: 30 | Fill #0

## 2016-07-28 MED FILL — GENVOYA TABLET: 150-150-200 | 30 days supply | Qty: 30 | Fill #0

## 2016-08-12 ENCOUNTER — Ambulatory Visit (INDEPENDENT_AMBULATORY_CARE_PROVIDER_SITE_OTHER): Payer: BLUE CROSS/BLUE SHIELD | Admitting: Internal Medicine

## 2016-08-12 ENCOUNTER — Encounter: Payer: Self-pay | Admitting: Internal Medicine

## 2016-08-12 VITALS — BP 122/74 | HR 61 | Temp 98.6°F | Ht 70.0 in | Wt 215.0 lb

## 2016-08-12 DIAGNOSIS — B20 Human immunodeficiency virus [HIV] disease: Secondary | ICD-10-CM | POA: Diagnosis not present

## 2016-08-12 DIAGNOSIS — Z23 Encounter for immunization: Secondary | ICD-10-CM | POA: Diagnosis not present

## 2016-08-12 DIAGNOSIS — A528 Late syphilis, latent: Secondary | ICD-10-CM | POA: Diagnosis not present

## 2016-08-12 DIAGNOSIS — Z0000 Encounter for general adult medical examination without abnormal findings: Secondary | ICD-10-CM | POA: Diagnosis not present

## 2016-08-12 DIAGNOSIS — B181 Chronic viral hepatitis B without delta-agent: Secondary | ICD-10-CM

## 2016-08-12 LAB — COMPREHENSIVE METABOLIC PANEL
ALT: 11 U/L (ref 9–46)
AST: 17 U/L (ref 10–40)
Albumin: 3.7 g/dL (ref 3.6–5.1)
Alkaline Phosphatase: 56 U/L (ref 40–115)
BUN: 11 mg/dL (ref 7–25)
CO2: 26 mmol/L (ref 20–31)
Calcium: 9.1 mg/dL (ref 8.6–10.3)
Chloride: 102 mmol/L (ref 98–110)
Creat: 1.13 mg/dL (ref 0.60–1.35)
Glucose, Bld: 81 mg/dL (ref 65–99)
Potassium: 4.1 mmol/L (ref 3.5–5.3)
Sodium: 136 mmol/L (ref 135–146)
Total Bilirubin: 0.5 mg/dL (ref 0.2–1.2)
Total Protein: 7.9 g/dL (ref 6.1–8.1)

## 2016-08-12 LAB — LIPID PANEL
Cholesterol: 138 mg/dL (ref <200)
HDL: 54 mg/dL (ref >40)
LDL Cholesterol: 72 mg/dL (ref <100)
Total CHOL/HDL Ratio: 2.6 Ratio (ref <5.0)
Triglycerides: 62 mg/dL (ref <150)
VLDL: 12 mg/dL (ref <30)

## 2016-08-12 LAB — CBC
HCT: 43 % (ref 38.5–50.0)
Hemoglobin: 14.2 g/dL (ref 13.2–17.1)
MCH: 29.9 pg (ref 27.0–33.0)
MCHC: 33 g/dL (ref 32.0–36.0)
MCV: 90.5 fL (ref 80.0–100.0)
MPV: 10.9 fL (ref 7.5–12.5)
Platelets: 230 10*3/uL (ref 140–400)
RBC: 4.75 MIL/uL (ref 4.20–5.80)
RDW: 14.4 % (ref 11.0–15.0)
WBC: 3.8 10*3/uL (ref 3.8–10.8)

## 2016-08-12 NOTE — Addendum Note (Signed)
Addended by: Andree Coss on: 08/12/2016 10:29 AM   Modules accepted: Orders

## 2016-08-12 NOTE — Assessment & Plan Note (Signed)
His adherence is excellent. I will recheck his hepatitis B viral load.

## 2016-08-12 NOTE — Addendum Note (Signed)
Addended by: Andree Coss on: 08/12/2016 10:44 AM   Modules accepted: Orders

## 2016-08-12 NOTE — Assessment & Plan Note (Signed)
I talked to him again about the importance of careful partners collection in the future if he does become sexually active.

## 2016-08-12 NOTE — Progress Notes (Signed)
Patient Active Problem List   Diagnosis Date Noted  . HIV disease (HCC) 07/12/2015    Priority: High  . Late latent syphilis 07/12/2015    Priority: Medium  . Chronic hepatitis B (HCC) 07/12/2015    Priority: Medium  . Normocytic anemia 07/12/2015  . Genital herpes 07/12/2015  . Hyperthyroidism 06/27/2015  . Abnormal liver function test 06/27/2015  . Pneumocystis jiroveci pneumonia (HCC) 06/27/2015    Patient's Medications  New Prescriptions   No medications on file  Previous Medications   DARUNAVIR (PREZISTA) 800 MG TABLET    Take 1 tablet (800 mg total) by mouth daily with breakfast.   ELVITEGRAVIR-COBICISTAT-EMTRICITABINE-TENOFOVIR (GENVOYA) 150-150-200-10 MG TABS TABLET    Take 1 tablet by mouth daily with breakfast.   SULFAMETHOXAZOLE-TRIMETHOPRIM (BACTRIM DS,SEPTRA DS) 800-160 MG TABLET    Take 1 tablet by mouth daily.  Modified Medications   No medications on file  Discontinued Medications   No medications on file    Subjective:  Daniel Dunlap is in for his first visit in the little over one year. He continues to take Genvoya, Prezista and trimethoprim sulfamethoxazole. He is tolerating them well. He has not missed any doses. 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 He is worried about recent weight gain. He has not been getting any regular exercise. He is currently not in a relationship and has not been sexually active.  Review of Systems: Review of Systems  Constitutional: Negative for weight loss.  Gastrointestinal: Negative for abdominal pain, diarrhea, nausea and vomiting.    Past Medical History:  Diagnosis Date  . HIV infection (HCC)   . Thyroid disease     Social History  Substance Use Topics  . Smoking status: Never Smoker  . Smokeless tobacco: Never Used  . Alcohol use 0.6 oz/week    1 Glasses of wine per week    No family history on file.  No Known Allergies  Objective:  Vitals:   08/12/16 0940  BP: 122/74    Pulse: 61  Temp: 98.6 F (37 C)  TempSrc: Oral  Weight: 215 lb (97.5 kg)  Height:  (1.778 m)   Body mass index is 30.85 kg/m.  Physical Exam  Constitutional: He is oriented to person, place, and time. No distress.  HENT:  Mouth/Throat: No oropharyngeal exudate.  Cardiovascular: Normal rate and regular rhythm.   No murmur heard. Pulmonary/Chest: Effort normal and breath sounds normal.  Abdominal: Soft. There is no tenderness.  Neurological: He is alert and oriented to person, place, and time.  Skin: No rash noted.  Psychiatric: Mood and affect normal.    Lab Results Lab Results  Component Value Date   WBC 2.7 (L) 01/30/2016   HGB 13.3 01/30/2016   HCT 40.6 01/30/2016   MCV 83.7 01/30/2016   PLT 223 01/30/2016    Lab Results  Component Value Date   CREATININE 1.02 01/30/2016   BUN 11 01/30/2016   NA 135 01/30/2016   K 3.8 01/30/2016   CL 100 01/30/2016   CO2 28 01/30/2016    Lab Results  Component Value Date   ALT 233 (H) 01/30/2016   AST 183 (H) 01/30/2016   ALKPHOS 71 01/30/2016   BILITOT 0.4 01/30/2016    Lab Results  Component Value Date   CHOL 108 (L) 06/12/2015   HDL 44 06/12/2015   LDLCALC 54 06/12/2015   TRIG 48 06/12/2015   CHOLHDL 2.5 06/12/2015  HIV 1 RNA Quant (copies/mL)  Date Value  01/30/2016 <20  07/12/2015 23,576 (H)  06/12/2015 24,011 (H)   CD4 T Cell Abs (/uL)  Date Value  01/30/2016 100 (L)  07/12/2015 30 (L)  06/12/2015 20 (L)     Problem List Items Addressed This Visit      High   HIV disease (HCC)    His blood work last October showed that his infection was coming under very good control and he was starting to have CD4 reconstitution. He will continue [employer]. He will get repeat blood work today and follow-up in 6 months.      Relevant Orders   T-helper cell (CD4)- ([facility] clinic only)   HIV 1 RNA quant-no reflex-bld   CBC   Comprehensive metabolic panel   RPR   Lipid panel     Medium   Chronic  hepatitis B (HCC)    His adherence is excellent. I will recheck his hepatitis B viral load.      Relevant Orders   Hepatitis B DNA, ultraquantitative, PCR   Late latent syphilis    I talked to him again about the importance of careful partners collection in the future if he does become sexually active.           Cliffton Asters, MD [facility] [phone] pager   [phone] cell 08/12/2016, 10:23 AM

## 2016-08-12 NOTE — Assessment & Plan Note (Signed)
His blood work last October showed that his infection was coming under very good control and he was starting to have CD4 reconstitution. He will continue [employer]. He will get repeat blood work today and follow-up in 6 months.

## 2016-08-13 LAB — T-HELPER CELL (CD4) - (RCID CLINIC ONLY)
CD4 % Helper T Cell: 11 % — ABNORMAL LOW (ref 33–55)
CD4 T Cell Abs: 130 /uL — ABNORMAL LOW (ref 400–2700)

## 2016-08-14 LAB — HEPATITIS B DNA, ULTRAQUANTITATIVE, PCR
Hepatitis B DNA (Calc): 1.03 Log IU/mL — ABNORMAL HIGH
Hepatitis B DNA: 11 IU/mL — ABNORMAL HIGH

## 2016-08-14 LAB — HIV-1 RNA QUANT-NO REFLEX-BLD
HIV 1 RNA Quant: 24 copies/mL — ABNORMAL HIGH
HIV-1 RNA Quant, Log: 1.38 Log copies/mL — ABNORMAL HIGH

## 2016-08-14 LAB — RPR

## 2016-09-01 MED FILL — PREZISTA 800 MG TABS: 800 | 30 days supply | Qty: 30 | Fill #1

## 2016-09-01 MED FILL — SULFAMETHOXAZOLE/TMP DS TAB: 800-160 | 30 days supply | Qty: 30 | Fill #2

## 2016-09-01 MED FILL — GENVOYA TABLET: 150-150-200 | 30 days supply | Qty: 30 | Fill #1

## 2016-09-05 ENCOUNTER — Ambulatory Visit (INDEPENDENT_AMBULATORY_CARE_PROVIDER_SITE_OTHER): Payer: BLUE CROSS/BLUE SHIELD | Admitting: Family

## 2016-09-05 ENCOUNTER — Encounter: Payer: Self-pay | Admitting: Family

## 2016-09-05 DIAGNOSIS — Z0000 Encounter for general adult medical examination without abnormal findings: Secondary | ICD-10-CM

## 2016-09-05 NOTE — Assessment & Plan Note (Addendum)
1) Anticipatory Guidance: Discussed importance of wearing a seatbelt while driving and not texting while driving; changing batteries in smoke detector at least once annually; wearing suntan lotion when outside; eating a balanced and moderate diet; getting physical activity at least 30 minutes per day.  2) Immunizations / Screenings / Labs:  Declines tetanus. All other immunizations are up-to-date per recommendations. Due for a dental exam encouraged to be completed independently. All other screenings a up-to-date per recommendations. Blood work previously completed and reviewed with patient.  Overall well exam with risk factors for cardiovascular disease including obesity. Recommend weight loss of approximately 5% of current body weight through nutrition and physical activity. Encouraged a nutritional intake that is moderate, balance, and varied. HIV appears adequately controlled and managed by infectious disease. Continue other healthy lifestyle behaviors and choices.follow-up prevention exam in 1 year. Follow-up office visit for chronic conditions as needed.

## 2016-09-05 NOTE — Progress Notes (Signed)
Subjective:    Patient ID: Daniel Dunlap, male    DOB: 01/30/1981, 36 y.o.   MRN: [id number]  Chief Complaint  Patient presents with  . Establish Care    fasting    HPI:  Aldric Dunlap is a 36 y.o. male who presents today for an annual wellness visit.   1) Health Maintenance -   Diet - Averages about 2-3 meals per day consisting of a regular vegetarian diet; Caffeine intake of 2-3 cups per day.   Exercise - No structured exercise    2) Preventative Exams / Immunizations:  Dental -- Due for exam  Vision -- Up to date   Health Maintenance  Topic Date Due  . TETANUS/TDAP  11/28/2001  . INFLUENZA VACCINE  11/26/2016  . HIV Screening  Completed     Immunization History  Administered Date(s) Administered  . Hepatitis A, Adult 02/26/2016, 08/12/2016  . Influenza,inj,Quad PF,36+ Mos 02/26/2016  . Influenza-Unspecified 11/27/2014  . Meningococcal Mcv4o 08/12/2016  . Pneumococcal Polysaccharide-23 02/26/2016  . Td 11/29/1991  . Tdap 11/29/1991     No Known Allergies   Outpatient Medications Prior to Visit  Medication Sig Dispense Refill  . darunavir (PREZISTA) 800 MG tablet Take 1 tablet (800 mg total) by mouth daily with breakfast. 30 tablet 1  . elvitegravir-cobicistat-emtricitabine-tenofovir (GENVOYA) 150-150-200-10 MG TABS tablet Take 1 tablet by mouth daily with breakfast. 30 tablet 1  . sulfamethoxazole-trimethoprim (BACTRIM DS,SEPTRA DS) 800-160 MG tablet Take 1 tablet by mouth daily. 30 tablet 11   No facility-administered medications prior to visit.      Past Medical History:  Diagnosis Date  . Genital warts   . HIV infection (HCC)   . HPV (human papilloma virus) infection   . Thyroid disease      History reviewed. No pertinent surgical history.   Family History  Problem Relation Age of Onset  . Healthy Mother   . Healthy Father      Social History   Social History  . Marital status: Single    Spouse name: N/A  . Number of  children: 0  . Years of education: 53   Occupational History  . Not on file.   Social History Main Topics  . Smoking status: Never Smoker  . Smokeless tobacco: Never Used  . Alcohol use 0.6 oz/week    1 Glasses of wine per week  . Drug use: No  . Sexual activity: Yes    Partners: Female, Male    Birth control/ protection: None   Other Topics Concern  . Not on file   Social History Narrative   Fun: Play music, listen to music.       Review of Systems  Constitutional: Denies fever, chills, fatigue, or significant weight gain/loss. HENT: Head: Denies headache or neck pain Ears: Denies changes in hearing, ringing in ears, earache, drainage Nose: Denies discharge, stuffiness, itching, nosebleed, sinus pain Throat: Denies sore throat, hoarseness, dry mouth, sores, thrush Eyes: Denies loss/changes in vision, pain, redness, blurry/double vision, flashing lights Cardiovascular: Denies chest pain/discomfort, tightness, palpitations, shortness of breath with activity, difficulty lying down, swelling, sudden awakening with shortness of breath Respiratory: Denies shortness of breath, cough, sputum production, wheezing Gastrointestinal: Denies dysphasia, heartburn, change in appetite, nausea, change in bowel habits, rectal bleeding, constipation, diarrhea, yellow skin or eyes Genitourinary: Denies frequency, urgency, burning/pain, blood in urine, incontinence, change in urinary strength. Musculoskeletal: Denies muscle/joint pain, stiffness, back pain, redness or swelling of joints, trauma Skin: Denies rashes, lumps, itching, dryness,  color changes, or hair/nail changes Neurological: Denies dizziness, fainting, seizures, weakness, numbness, tingling, tremor Psychiatric - Denies nervousness, stress, depression or memory loss Endocrine: Denies heat or cold intolerance, sweating, frequent urination, excessive thirst, changes in appetite Hematologic: Denies ease of bruising or bleeding       Objective:     BP 110/82 (BP Location: Left Arm, Patient Position: Sitting, Cuff Size: Large)   Pulse 70   Temp 98.2 F (36.8 C) (Oral)   Resp 16   Ht 5\' 10"  (1.778 m)   Wt 218 lb 1.9 oz (98.9 kg)   SpO2 98%   BMI 31.30 kg/m  Nursing note and vital signs reviewed.  Physical Exam  Constitutional: He is oriented to person, place, and time. He appears well-developed and well-nourished.  HENT:  Head: Normocephalic.  Right Ear: Hearing, tympanic membrane, external ear and ear canal normal.  Left Ear: Hearing, tympanic membrane, external ear and ear canal normal.  Nose: Nose normal.  Mouth/Throat: Uvula is midline, oropharynx is clear and moist and mucous membranes are normal.  Eyes: Conjunctivae and EOM are normal. Pupils are equal, round, and reactive to light.  Neck: Neck supple. No JVD present. No tracheal deviation present. No thyromegaly present.  Cardiovascular: Normal rate, regular rhythm, normal heart sounds and intact distal pulses.   Pulmonary/Chest: Effort normal and breath sounds normal.  Abdominal: Soft. Bowel sounds are normal. He exhibits no distension and no mass. There is no tenderness. There is no rebound and no guarding.  Musculoskeletal: Normal range of motion. He exhibits no edema or tenderness.  Lymphadenopathy:    He has no cervical adenopathy.  Neurological: He is alert and oriented to person, place, and time. He has normal reflexes. No cranial nerve deficit. He exhibits normal muscle tone. Coordination normal.  Skin: Skin is warm and dry.  Psychiatric: He has a normal mood and affect. His behavior is normal. Judgment and thought content normal.       Assessment & Plan:   Problem List Items Addressed This Visit      Other   Routine adult health maintenance    1) Anticipatory Guidance: Discussed importance of wearing a seatbelt while driving and not texting while driving; changing batteries in smoke detector at least once annually; wearing suntan lotion  when outside; eating a balanced and moderate diet; getting physical activity at least 30 minutes per day.  2) Immunizations / Screenings / Labs:  Declines tetanus. All other immunizations are up-to-date per recommendations. Due for a dental exam encouraged to be completed independently. All other screenings a up-to-date per recommendations. Blood work previously completed and reviewed with patient.  Overall well exam with risk factors for cardiovascular disease including obesity. Recommend weight loss of approximately 5% of current body weight through nutrition and physical activity. Encouraged a nutritional intake that is moderate, balance, and varied. HIV appears adequately controlled and managed by infectious disease. Continue other healthy lifestyle behaviors and choices.follow-up prevention exam in 1 year. Follow-up office visit for chronic conditions as needed.           I am having Mr. Sargent maintain his sulfamethoxazole-trimethoprim, elvitegravir-cobicistat-emtricitabine-tenofovir, and darunavir.   Follow-up: Return in about 6 months (around 03/08/2017), or if symptoms worsen or fail to improve.   Jeanine Luzalone, Gregory, FNP

## 2016-09-05 NOTE — Patient Instructions (Signed)
Thank you for choosing [facility].  SUMMARY AND INSTRUCTIONS:  Medication:  Please continue to take your medications as prescribed.   Follow up:  If your symptoms worsen or fail to improve, please contact our office for further instruction, or in case of emergency go directly to the emergency room at the closest medical facility.     Health Maintenance, Male A healthy lifestyle and preventive care is important for your health and wellness. Ask your health care provider about what schedule of regular examinations is right for you. What should I know about weight and diet?  Eat a Healthy Diet  Eat plenty of vegetables, fruits, whole grains, low-fat dairy products, and lean protein.  Do not eat a lot of foods high in solid fats, added sugars, or salt. Maintain a Healthy Weight  Regular exercise can help you achieve or maintain a healthy weight. You should:  Do at least 150 minutes of exercise each week. The exercise should increase your heart rate and make you sweat (moderate-intensity exercise).  Do strength-training exercises at least twice a week. Watch Your Levels of Cholesterol and Blood Lipids  Have your blood tested for lipids and cholesterol every 5 years starting at 36 years of age. If you are at high risk for heart disease, you should start having your blood tested when you are 36 years old. You may need to have your cholesterol levels checked more often if:  Your lipid or cholesterol levels are high.  You are older than 36 years of age.  You are at high risk for heart disease. What should I know about cancer screening? Many types of cancers can be detected early and may often be prevented. Lung Cancer  You should be screened every year for lung cancer if:  You are a current smoker who has smoked for at least 30 years.  You are a former smoker who has quit within the past 15 years.  Talk to your health care provider about your screening options, when you  should start screening, and how often you should be screened. Colorectal Cancer  Routine colorectal cancer screening usually begins at 36 years of age and should be repeated every 5-10 years until you are 36 years old. You may need to be screened more often if early forms of precancerous polyps or small growths are found. Your health care provider may recommend screening at an earlier age if you have risk factors for colon cancer.  Your health care provider may recommend using home test kits to check for hidden blood in the stool.  A small camera at the end of a tube can be used to examine your colon (sigmoidoscopy or colonoscopy). This checks for the earliest forms of colorectal cancer. Prostate and Testicular Cancer  Depending on your age and overall health, your health care provider may do certain tests to screen for prostate and testicular cancer.  Talk to your health care provider about any symptoms or concerns you have about testicular or prostate cancer. Skin Cancer  Check your skin from head to toe regularly.  Tell your health care provider about any new moles or changes in moles, especially if:  There is a change in a mole's size, shape, or color.  You have a mole that is larger than a pencil eraser.  Always use sunscreen. Apply sunscreen liberally and repeat throughout the day.  Protect yourself by wearing long sleeves, pants, a wide-brimmed hat, and sunglasses when outside. What should I know about heart disease,  diabetes, and high blood pressure?  If you are 5-47 years of age, have your blood pressure checked every 3-5 years. If you are 35 years of age or older, have your blood pressure checked every year. You should have your blood pressure measured twice-once when you are at a hospital or clinic, and once when you are not at a hospital or clinic. Record the average of the two measurements. To check your blood pressure when you are not at a hospital or clinic, you can  use:  An automated blood pressure machine at a pharmacy.  A home blood pressure monitor.  Talk to your health care provider about your target blood pressure.  If you are between 64-65 years old, ask your health care provider if you should take aspirin to prevent heart disease.  Have regular diabetes screenings by checking your fasting blood sugar level.  If you are at a normal weight and have a low risk for diabetes, have this test once every three years after the age of 35.  If you are overweight and have a high risk for diabetes, consider being tested at a younger age or more often.  A one-time screening for abdominal aortic aneurysm (AAA) by ultrasound is recommended for men aged 65-75 years who are current or former smokers. What should I know about preventing infection? Hepatitis B  If you have a higher risk for hepatitis B, you should be screened for this virus. Talk with your health care provider to find out if you are at risk for hepatitis B infection. Hepatitis C  Blood testing is recommended for:  Everyone born from [birth year] through 1965.  Anyone with known risk factors for hepatitis C. Sexually Transmitted Diseases (STDs)  You should be screened each year for STDs including gonorrhea and chlamydia if:  You are sexually active and are younger than 36 years of age.  You are older than 37 years of age and your health care provider tells you that you are at risk for this type of infection.  Your sexual activity has changed since you were last screened and you are at an increased risk for chlamydia or gonorrhea. Ask your health care provider if you are at risk.  Talk with your health care provider about whether you are at high risk of being infected with HIV. Your health care provider may recommend a prescription medicine to help prevent HIV infection. What else can I do?  Schedule regular health, dental, and eye exams.  Stay current with your vaccines  (immunizations).  Do not use any tobacco products, such as cigarettes, chewing tobacco, and e-cigarettes. If you need help quitting, ask your health care provider.  Limit alcohol intake to no more than 2 drinks per day. One drink equals 12 ounces of beer, 5 ounces of wine, or 1 ounces of hard liquor.  Do not use street drugs.  Do not share needles.  Ask your health care provider for help if you need support or information about quitting drugs.  Tell your health care provider if you often feel depressed.  Tell your health care provider if you have ever been abused or do not feel safe at home. This information is not intended to replace advice given to you by your health care provider. Make sure you discuss any questions you have with your health care provider. Document Released: 10/11/2007 Document Revised: 12/12/2015 Document Reviewed: 01/16/2015 Elsevier Interactive Patient Education  2017 [employer].

## 2016-10-01 ENCOUNTER — Other Ambulatory Visit: Payer: Self-pay | Admitting: Pharmacist

## 2016-10-01 DIAGNOSIS — B20 Human immunodeficiency virus [HIV] disease: Secondary | ICD-10-CM

## 2016-10-01 MED ORDER — DARUNAVIR ETHANOLATE 800 MG PO TABS: 800.0000 mg | ORAL_TABLET | Freq: Every day | ORAL | 11 refills | Status: DC

## 2016-10-01 MED ORDER — ELVITEG-COBIC-EMTRICIT-TENOFAF 150-150-200-10 MG PO TABS: 1.0000 | ORAL_TABLET | Freq: Every day | ORAL | 11 refills | Status: DC

## 2016-10-01 MED FILL — GENVOYA TABLET: 150-150-200 | 30 days supply | Qty: 30 | Fill #0

## 2016-10-01 MED FILL — SULFAMETHOXAZOLE/TMP DS TAB: 800-160 | 30 days supply | Qty: 30 | Fill #3

## 2016-10-01 MED FILL — PREZISTA 800 MG TABS: 800 | 30 days supply | Qty: 30 | Fill #0

## 2016-11-11 MED FILL — PREZISTA 800 MG TABS: 800 | 30 days supply | Qty: 30 | Fill #1

## 2016-11-11 MED FILL — GENVOYA TABLET: 150-150-200 | 30 days supply | Qty: 30 | Fill #1

## 2016-11-11 MED FILL — SULFAMETHOXAZOLE/TMP DS TAB: 800-160 | 30 days supply | Qty: 30 | Fill #4

## 2016-12-03 MED FILL — GENVOYA TABLET: 150-150-200 | 30 days supply | Qty: 30 | Fill #2

## 2016-12-03 MED FILL — PREZISTA 800 MG TABS: 800 | 30 days supply | Qty: 30 | Fill #2

## 2016-12-26 MED FILL — GENVOYA TABLET: 150-150-200 | 30 days supply | Qty: 30 | Fill #3

## 2016-12-26 MED FILL — PREZISTA 800 MG TABS: 800 | 30 days supply | Qty: 30 | Fill #3

## 2017-02-12 ENCOUNTER — Encounter: Payer: Self-pay | Admitting: Internal Medicine

## 2017-02-16 ENCOUNTER — Ambulatory Visit: Payer: Self-pay

## 2017-02-17 ENCOUNTER — Encounter: Payer: Self-pay | Admitting: Internal Medicine

## 2017-03-23 ENCOUNTER — Encounter: Payer: Self-pay | Admitting: *Deleted

## 2017-03-23 ENCOUNTER — Other Ambulatory Visit: Payer: Self-pay | Admitting: Pharmacist

## 2017-03-23 ENCOUNTER — Encounter: Payer: Self-pay | Admitting: Internal Medicine

## 2017-03-23 ENCOUNTER — Other Ambulatory Visit: Payer: Self-pay | Admitting: *Deleted

## 2017-03-23 ENCOUNTER — Telehealth: Payer: Self-pay | Admitting: *Deleted

## 2017-03-23 DIAGNOSIS — B20 Human immunodeficiency virus [HIV] disease: Secondary | ICD-10-CM

## 2017-03-23 MED ORDER — ELVITEG-COBIC-EMTRICIT-TENOFAF 150-150-200-10 MG PO TABS: 1.0000 | ORAL_TABLET | Freq: Every day | ORAL | 3 refills | Status: DC

## 2017-03-23 MED ORDER — DARUNAVIR ETHANOLATE 800 MG PO TABS: 800.0000 mg | ORAL_TABLET | Freq: Every day | ORAL | 3 refills | Status: DC

## 2017-03-23 MED ORDER — SULFAMETHOXAZOLE-TRIMETHOPRIM 800-160 MG PO TABS: 1.0000 | ORAL_TABLET | Freq: Every day | ORAL | 11 refills | Status: DC

## 2017-03-23 MED ORDER — ELVITEG-COBIC-EMTRICIT-TENOFAF 150-150-200-10 MG PO TABS: 1.0000 | ORAL_TABLET | Freq: Every day | ORAL | 11 refills | Status: DC

## 2017-03-23 MED ORDER — DARUNAVIR ETHANOLATE 800 MG PO TABS: 800.0000 mg | ORAL_TABLET | Freq: Every day | ORAL | 11 refills | Status: DC

## 2017-03-23 MED ORDER — SULFAMETHOXAZOLE-TRIMETHOPRIM 800-160 MG PO TABS: 1.0000 | ORAL_TABLET | Freq: Every day | ORAL | 3 refills | Status: DC

## 2017-03-23 NOTE — Telephone Encounter (Signed)
Patient called on MD line stating he needed his meds. Did not provide his name. Pulled him up in Epic and asked if he was insured. [facility] I said I would call [facility] and call him back shortly. He stated that is not helpful and he can not receive calls. He said he would call back whenever and hung up the phone. Spoke to Dover CorporationCassie, pharmacist and she was able to see that ADAP has been approved and she sent the medications to [facility].

## 2017-08-06 ENCOUNTER — Other Ambulatory Visit: Payer: Self-pay | Admitting: *Deleted

## 2017-08-06 ENCOUNTER — Other Ambulatory Visit (HOSPITAL_COMMUNITY)
Admission: RE | Admit: 2017-08-06 | Discharge: 2017-08-06 | Disposition: A | Discharge status: Home / Self Care | Payer: BLUE CROSS/BLUE SHIELD | Source: Ambulatory Visit | Attending: Internal Medicine | Admitting: Internal Medicine

## 2017-08-06 ENCOUNTER — Encounter: Payer: Self-pay | Admitting: Internal Medicine

## 2017-08-06 ENCOUNTER — Other Ambulatory Visit: Payer: BLUE CROSS/BLUE SHIELD

## 2017-08-06 DIAGNOSIS — Z79899 Other long term (current) drug therapy: Secondary | ICD-10-CM | POA: Diagnosis not present

## 2017-08-06 DIAGNOSIS — B20 Human immunodeficiency virus [HIV] disease: Secondary | ICD-10-CM | POA: Insufficient documentation

## 2017-08-06 DIAGNOSIS — Z113 Encounter for screening for infections with a predominantly sexual mode of transmission: Secondary | ICD-10-CM | POA: Diagnosis not present

## 2017-08-06 MED ORDER — ELVITEG-COBIC-EMTRICIT-TENOFAF 150-150-200-10 MG PO TABS: 1.0000 | ORAL_TABLET | Freq: Every day | ORAL | 0 refills | Status: DC

## 2017-08-06 MED ORDER — DARUNAVIR ETHANOLATE 800 MG PO TABS: 800.0000 mg | ORAL_TABLET | Freq: Every day | ORAL | 0 refills | Status: DC

## 2017-08-06 MED ORDER — SULFAMETHOXAZOLE-TRIMETHOPRIM 800-160 MG PO TABS: 1.0000 | ORAL_TABLET | Freq: Every day | ORAL | 0 refills | Status: DC

## 2017-08-06 NOTE — Progress Notes (Signed)
Patient in for labs, has new insurance. He is not taking medications correctly, just restarted and needs refills.  RN sent 30 day refill to [facility]. Patient will need to keep his follow up appointment for future refills. Andree CossHowell, Michelle M, RN

## 2017-08-07 LAB — COMPLETE METABOLIC PANEL WITH GFR
AG Ratio: 0.8 (calc) — ABNORMAL LOW (ref 1.0–2.5)
ALT: 17 U/L (ref 9–46)
AST: 23 U/L (ref 10–40)
Albumin: 3.6 g/dL (ref 3.6–5.1)
Alkaline phosphatase (APISO): 98 U/L (ref 40–115)
BUN/Creatinine Ratio: 28 (calc) — ABNORMAL HIGH (ref 6–22)
BUN: 16 mg/dL (ref 7–25)
CO2: 26 mmol/L (ref 20–32)
Calcium: 9.2 mg/dL (ref 8.6–10.3)
Chloride: 103 mmol/L (ref 98–110)
Creat: 0.58 mg/dL — ABNORMAL LOW (ref 0.60–1.35)
GFR, Est African American: 152 mL/min/{1.73_m2} (ref >=60)
GFR, Est Non African American: 131 mL/min/{1.73_m2} (ref >=60)
Globulin: 4.6 g/dL (calc) — ABNORMAL HIGH (ref 1.9–3.7)
Glucose, Bld: 101 mg/dL — ABNORMAL HIGH (ref 65–99)
Potassium: 3.9 mmol/L (ref 3.5–5.3)
Sodium: 136 mmol/L (ref 135–146)
Total Bilirubin: 0.4 mg/dL (ref 0.2–1.2)
Total Protein: 8.2 g/dL — ABNORMAL HIGH (ref 6.1–8.1)

## 2017-08-07 LAB — CBC WITH DIFFERENTIAL/PLATELET
Basophils Absolute: 9 cells/uL (ref 0–200)
Basophils Relative: 0.2 %
Eosinophils Absolute: 61 cells/uL (ref 15–500)
Eosinophils Relative: 1.3 %
HCT: 32.7 % — ABNORMAL LOW (ref 38.5–50.0)
Hemoglobin: 10.7 g/dL — ABNORMAL LOW (ref 13.2–17.1)
Lymphs Abs: 2195 cells/uL (ref 850–3900)
MCH: 25 pg — ABNORMAL LOW (ref 27.0–33.0)
MCHC: 32.7 g/dL (ref 32.0–36.0)
MCV: 76.4 fL — ABNORMAL LOW (ref 80.0–100.0)
MPV: 12.6 fL — ABNORMAL HIGH (ref 7.5–12.5)
Monocytes Relative: 13.3 %
Neutro Abs: 1810 cells/uL (ref 1500–7800)
Neutrophils Relative %: 38.5 %
Platelets: 183 10*3/uL (ref 140–400)
RBC: 4.28 10*6/uL (ref 4.20–5.80)
RDW: 14.1 % (ref 11.0–15.0)
Total Lymphocyte: 46.7 %
WBC mixed population: 625 cells/uL (ref 200–950)
WBC: 4.7 10*3/uL (ref 3.8–10.8)

## 2017-08-07 LAB — LIPID PANEL
Cholesterol: 77 mg/dL (ref <200)
HDL: 37 mg/dL — ABNORMAL LOW (ref >40)
LDL Cholesterol (Calc): 23 mg/dL (calc)
Non-HDL Cholesterol (Calc): 40 mg/dL (calc) (ref <130)
Total CHOL/HDL Ratio: 2.1 (calc) (ref <5.0)
Triglycerides: 88 mg/dL (ref <150)

## 2017-08-07 LAB — URINE CYTOLOGY ANCILLARY ONLY
Chlamydia: NEGATIVE
Neisseria Gonorrhea: NEGATIVE

## 2017-08-07 LAB — T-HELPER CELL (CD4) - (RCID CLINIC ONLY)
CD4 % Helper T Cell: 10 % — ABNORMAL LOW (ref 33–55)
CD4 T Cell Abs: 200 /uL — ABNORMAL LOW (ref 400–2700)

## 2017-08-07 LAB — RPR: RPR Ser Ql: NONREACTIVE

## 2017-08-10 LAB — HIV-1 RNA QUANT-NO REFLEX-BLD
HIV 1 RNA Quant: 96500 copies/mL — ABNORMAL HIGH
HIV-1 RNA Quant, Log: 4.98 Log copies/mL — ABNORMAL HIGH

## 2017-08-19 ENCOUNTER — Encounter: Payer: Self-pay | Admitting: Family

## 2017-08-19 ENCOUNTER — Ambulatory Visit (INDEPENDENT_AMBULATORY_CARE_PROVIDER_SITE_OTHER): Payer: BLUE CROSS/BLUE SHIELD | Admitting: Family

## 2017-08-19 VITALS — BP 131/79 | HR 93 | Temp 98.5°F | Ht 70.0 in | Wt 188.0 lb

## 2017-08-19 DIAGNOSIS — B20 Human immunodeficiency virus [HIV] disease: Secondary | ICD-10-CM | POA: Diagnosis not present

## 2017-08-19 MED ORDER — SULFAMETHOXAZOLE-TRIMETHOPRIM 800-160 MG PO TABS: 1.0000 | ORAL_TABLET | Freq: Every day | ORAL | 0 refills | Status: DC

## 2017-08-19 MED ORDER — ELVITEG-COBIC-EMTRICIT-TENOFAF 150-150-200-10 MG PO TABS: 1.0000 | ORAL_TABLET | Freq: Every day | ORAL | 0 refills | Status: DC

## 2017-08-19 MED ORDER — DARUNAVIR ETHANOLATE 800 MG PO TABS: 800.0000 mg | ORAL_TABLET | Freq: Every day | ORAL | 0 refills | Status: DC

## 2017-08-19 NOTE — Patient Instructions (Signed)
Good to see you!  We will check your blood work today.  Plan to follow up with myself or Dr. Orvan Falconerampbell.  Continue to take the Genvoya and Prezista.

## 2017-08-19 NOTE — Progress Notes (Signed)
Subjective:    Patient ID: Daniel Dunlap, male    DOB: 03/12/1981, 37 y.o.   MRN: [id number]  Chief Complaint  Patient presents with  . Follow-up    inconsitent with taking medication     HPI:  Daniel OldsDemetrius Dunlap is a 37 y.o. male who presents today for routine follow up of his HIV disease.  Daniel Dunlap was last seen on in April 2018 and was taking the regimen of Genvoya and Prezista as well as trimethoprim-sulfamethoxazole and was tolerating them well. His last CD4 count was 130 with a viral load of 24.  Per phone notes, Daniel Dunlap has less than optimally taking his medications and just restarted them.  He states that he was without his medication for about 1 month and has since restarting taking his medication within the last week. His blood work shows that his CD4 count is now 200 and has a viral load of 96,500. Other relevant lab work include negative RPR, gonorrhea, and chlamydia. He has no problems obtaining his medications.   Denies fevers, chills, night sweats, headaches, changes in vision, neck pain/stiffness, nausea, diarrhea, vomiting, lesions or rashes.     Immunization History  Administered Date(s) Administered  . Hepatitis A, Adult 02/26/2016, 08/12/2016  . Influenza,inj,Quad PF,6+ Mos 02/26/2016  . Influenza-Unspecified 11/27/2014  . Meningococcal Mcv4o 08/12/2016  . Pneumococcal Polysaccharide-23 02/26/2016  . Td 11/29/1991  . Tdap 11/29/1991     No Known Allergies    Outpatient Medications Prior to Visit  Medication Sig Dispense Refill  . darunavir (PREZISTA) 800 MG tablet Take 1 tablet (800 mg total) by mouth daily with breakfast. 30 tablet 0  . elvitegravir-cobicistat-emtricitabine-tenofovir (GENVOYA) 150-150-200-10 MG TABS tablet Take 1 tablet by mouth daily with breakfast. 30 tablet 0  . sulfamethoxazole-trimethoprim (BACTRIM DS,SEPTRA DS) 800-160 MG tablet Take 1 tablet by mouth daily. 30 tablet 0   No facility-administered medications prior to visit.       Past Medical History:  Diagnosis Date  . Genital warts   . HIV infection (HCC)   . HPV (human papilloma virus) infection   . Thyroid disease      History reviewed. No pertinent surgical history.    Review of Systems  Constitutional: Negative for activity change, appetite change, diaphoresis, fatigue, fever and unexpected weight change.  HENT: Negative for congestion, sinus pressure and sore throat.   Respiratory: Negative for cough, chest tightness, shortness of breath and wheezing.   Cardiovascular: Negative for chest pain and leg swelling.  Gastrointestinal: Negative for abdominal pain, constipation, diarrhea, nausea and vomiting.  Genitourinary: Negative for dysuria, flank pain, frequency, genital sores, hematuria and urgency.  Neurological: Negative for weakness and headaches.      Objective:    BP 131/79 (BP Location: Right Arm, Patient Position: Sitting, Cuff Size: Large)   Pulse 93   Temp 98.5 F (36.9 C) (Oral)   Ht 5\' 10"  (1.778 m)   Wt 188 lb (85.3 kg)   BMI 26.98 kg/m  Nursing note and vital signs reviewed.  Physical Exam  Constitutional: He is oriented to person, place, and time. He appears well-developed. No distress.  HENT:  Mouth/Throat: Oropharynx is clear and moist.  Eyes: Conjunctivae are normal.  Neck: Neck supple.  Cardiovascular: Regular rhythm, normal heart sounds and intact distal pulses. Tachycardia present. Exam reveals no gallop and no friction rub.  No murmur heard. Pulmonary/Chest: Effort normal and breath sounds normal. No respiratory distress. He has no wheezes. He has no rales. He exhibits no  tenderness.  Abdominal: Soft. Bowel sounds are normal. There is no tenderness.  Lymphadenopathy:    He has no cervical adenopathy.  Neurological: He is alert and oriented to person, place, and time.  Skin: Skin is warm and dry. No rash noted.  Psychiatric: He has a normal mood and affect. His behavior is normal. Judgment and thought content  normal.       Assessment & Plan:   Problem List Items Addressed This Visit      Other   HIV disease (HCC) - Primary    Less than optimal control with current salvage regimen secondary to less than optimal medication compliance. Unfortunately no reflex genotype was ordered during most recent blood work. Will check today given his multiple resistances. Plan to continue Genvoya, Prezista and Bactrim. CD4 count is improved and if remains above 200 for 3 months can consider stopping Bactrim. Declines vaccinations today. Follow up in 3 months or sooner pending results.       Relevant Medications   darunavir (PREZISTA) 800 MG tablet   elvitegravir-cobicistat-emtricitabine-tenofovir (GENVOYA) 150-150-200-10 MG TABS tablet   sulfamethoxazole-trimethoprim (BACTRIM DS,SEPTRA DS) 800-160 MG tablet   Other Relevant Orders   HIV RNA, RTPCR W/R GT (RTI, PI,INT)       I am having Daniel Dunlap maintain his darunavir, elvitegravir-cobicistat-emtricitabine-tenofovir, and sulfamethoxazole-trimethoprim.   Meds ordered this encounter  Medications  . darunavir (PREZISTA) 800 MG tablet    Sig: Take 1 tablet (800 mg total) by mouth daily with breakfast.    Dispense:  30 tablet    Refill:  0    Order Specific Question:   Supervising Provider    Answer:   Judyann Munson [[id number]]  . elvitegravir-cobicistat-emtricitabine-tenofovir (GENVOYA) 150-150-200-10 MG TABS tablet    Sig: Take 1 tablet by mouth daily with breakfast.    Dispense:  30 tablet    Refill:  0    Order Specific Question:   Supervising Provider    Answer:   Judyann Munson [[id number]]  . sulfamethoxazole-trimethoprim (BACTRIM DS,SEPTRA DS) 800-160 MG tablet    Sig: Take 1 tablet by mouth daily.    Dispense:  30 tablet    Refill:  0    Order Specific Question:   Supervising Provider    Answer:   Judyann Munson [[id number]]     Follow-up: Return in about 3 months (around 11/18/2017), or if symptoms worsen or fail to improve.   Marcos Eke,  MSN, [facility]

## 2017-08-19 NOTE — Assessment & Plan Note (Signed)
Less than optimal control with current salvage regimen secondary to less than optimal medication compliance. Unfortunately no reflex genotype was ordered during most recent blood work. Will check today given his multiple resistances. Plan to continue Genvoya, Prezista and Bactrim. CD4 count is improved and if remains above 200 for 3 months can consider stopping Bactrim. Declines vaccinations today. Follow up in 3 months or sooner pending results.

## 2017-09-01 ENCOUNTER — Encounter (INDEPENDENT_AMBULATORY_CARE_PROVIDER_SITE_OTHER): Payer: Self-pay

## 2017-09-01 LAB — HIV-1 INTEGRASE GENOTYPE

## 2017-09-01 LAB — HIV-1 GENOTYPE: HIV-1 Genotype: DETECTED — AB

## 2017-09-01 LAB — HIV RNA, RTPCR W/R GT (RTI, PI,INT)
HIV 1 RNA Quant: 1210 copies/mL — ABNORMAL HIGH
HIV-1 RNA Quant, Log: 3.08 Log copies/mL — ABNORMAL HIGH

## 2017-09-10 ENCOUNTER — Other Ambulatory Visit: Payer: Self-pay | Admitting: Family

## 2017-09-10 DIAGNOSIS — B20 Human immunodeficiency virus [HIV] disease: Secondary | ICD-10-CM

## 2017-09-10 MED ORDER — ELVITEG-COBIC-EMTRICIT-TENOFAF 150-150-200-10 MG PO TABS: 1.0000 | ORAL_TABLET | Freq: Every day | ORAL | 5 refills | Status: DC

## 2017-09-10 MED ORDER — DARUNAVIR ETHANOLATE 800 MG PO TABS: 800.0000 mg | ORAL_TABLET | Freq: Every day | ORAL | 5 refills | Status: DC

## 2017-09-10 MED ORDER — SULFAMETHOXAZOLE-TRIMETHOPRIM 800-160 MG PO TABS: 1.0000 | ORAL_TABLET | Freq: Every day | ORAL | 5 refills | Status: DC

## 2017-09-15 ENCOUNTER — Encounter: Payer: Self-pay | Admitting: Family

## 2017-12-01 ENCOUNTER — Ambulatory Visit: Payer: BLUE CROSS/BLUE SHIELD | Admitting: Family

## 2017-12-01 NOTE — Progress Notes (Deleted)
   Subjective:    Patient ID: Daniel Dunlap, male    DOB: 09/01/1980, 37 y.o.   MRN: [id number]  No chief complaint on file.    HPI:  Daniel Dunlap is a 37 y.o. male who presents today for routine follow up of his HIV disease.   Daniel Dunlap was previously seen in the office on 08/19/17 to re-enter into care. He has a genotype with M184V and E138A. His viral load at the time was 1,210 with a CD4 count of 200. He was continued on NigerGenvoya and Prezista. He was also continued on his Bactrim for OI prophylaxis.    No Known Allergies    Outpatient Medications Prior to Visit  Medication Sig Dispense Refill  . darunavir (PREZISTA) 800 MG tablet Take 1 tablet (800 mg total) by mouth daily with breakfast. 30 tablet 5  . elvitegravir-cobicistat-emtricitabine-tenofovir (GENVOYA) 150-150-200-10 MG TABS tablet Take 1 tablet by mouth daily with breakfast. 30 tablet 5  . sulfamethoxazole-trimethoprim (BACTRIM DS,SEPTRA DS) 800-160 MG tablet Take 1 tablet by mouth daily. 30 tablet 5   No facility-administered medications prior to visit.      Past Medical History:  Diagnosis Date  . Genital warts   . HIV infection (HCC)   . HPV (human papilloma virus) infection   . Thyroid disease      No past surgical history on file.     Review of Systems  Constitutional: Negative for appetite change, chills, fatigue, fever and unexpected weight change.  Eyes: Negative for visual disturbance.  Respiratory: Negative for cough, chest tightness, shortness of breath and wheezing.   Cardiovascular: Negative for chest pain and leg swelling.  Gastrointestinal: Negative for abdominal pain, constipation, diarrhea, nausea and vomiting.  Genitourinary: Negative for dysuria, flank pain, frequency, genital sores, hematuria and urgency.  Skin: Negative for rash.  Allergic/Immunologic: Negative for immunocompromised state.  Neurological: Negative for dizziness and headaches.      Objective:    There were no  vitals taken for this visit. Nursing note and vital signs reviewed.  Physical Exam  Constitutional: He is oriented to person, place, and time. He appears well-developed. No distress.  HENT:  Mouth/Throat: Oropharynx is clear and moist.  Eyes: Conjunctivae are normal.  Neck: Neck supple.  Cardiovascular: Normal rate, regular rhythm, normal heart sounds and intact distal pulses. Exam reveals no gallop and no friction rub.  No murmur heard. Pulmonary/Chest: Effort normal and breath sounds normal. No respiratory distress. He has no wheezes. He has no rales. He exhibits no tenderness.  Abdominal: Soft. Bowel sounds are normal. There is no tenderness.  Lymphadenopathy:    He has no cervical adenopathy.  Neurological: He is alert and oriented to person, place, and time.  Skin: Skin is warm and dry. No rash noted.  Psychiatric: He has a normal mood and affect. His behavior is normal. Judgment and thought content normal.       Assessment & Plan:   Problem List Items Addressed This Visit    None       I am having Arthur Tierney maintain his darunavir, elvitegravir-cobicistat-emtricitabine-tenofovir, and sulfamethoxazole-trimethoprim.   No orders of the defined types were placed in this encounter.    Follow-up: No follow-ups on file.   Marcos EkeGreg Calone, MSN, FNP-C Nurse Practitioner [facility] Office phone: [phone] Pager: [phone] [facility] Main number: [phone]

## 2017-12-08 ENCOUNTER — Ambulatory Visit (INDEPENDENT_AMBULATORY_CARE_PROVIDER_SITE_OTHER): Payer: BLUE CROSS/BLUE SHIELD | Admitting: Family

## 2017-12-08 ENCOUNTER — Encounter: Payer: Self-pay | Admitting: Family

## 2017-12-08 VITALS — BP 136/75 | HR 77 | Temp 98.4°F | Ht 70.0 in | Wt 190.0 lb

## 2017-12-08 DIAGNOSIS — B20 Human immunodeficiency virus [HIV] disease: Secondary | ICD-10-CM | POA: Diagnosis not present

## 2017-12-08 DIAGNOSIS — B181 Chronic viral hepatitis B without delta-agent: Secondary | ICD-10-CM

## 2017-12-08 DIAGNOSIS — Z23 Encounter for immunization: Secondary | ICD-10-CM

## 2017-12-08 NOTE — Assessment & Plan Note (Signed)
[name] appears stabile with no current symptoms. I will check his blood work today and obtain a baseline ultrasound with elastography. No medication/treatment is required at the current time. Continue to monitor.

## 2017-12-08 NOTE — Assessment & Plan Note (Signed)
[name] appears to be adequately controlled with his current medication regimen despite multiple mutations. He is adherent with his medication regimen and has no problems taking or obtaining his medications. No current signs/symptoms of progressive HIV disease/opportunistic infection through history or physical exam. Prevnar updated today. Will plan to update Menveo at next office visit as it was unavailable today. Recommend influenza vaccination in September. Continue current dose of Genvoya and Prescobix. Continue Bactrim for now pending blood work results with plan to discontinue if he remains with a CD4 count above 200. I will check his blood work today. Follow up office visit in 6 months or sooner if needed with lab work 1-2 weeks prior to appointment.

## 2017-12-08 NOTE — Progress Notes (Signed)
Subjective:    Patient ID: Daniel Dunlap, male    DOB: 08/18/1980, 37 y.o.   MRN: [id number]  Chief Complaint  Patient presents with  . Follow-up    HIV     HPI:  Daniel Dunlap is a 37 y.o. male who presents today for routine follow up of HIV disease.   [name] was last seen in the office on 08/19/17 and had a viral load of 1200 with a CD4 count of 200. He was continued on his regimen of Genvoya and Prescobix.   [name] has been taking his medication as prescribed with no adverse side effects. He has not missed any doses and has no problems obtaining or taking his medications. Denies fevers, chills, night sweats, headaches, changes in vision, neck pain/stiffness, nausea, diarrhea, vomiting, lesions or rashes.  He continues to work and is scheduled for a vacation in a [place] weeks [place]    No Known Allergies    Outpatient Medications Prior to Visit  Medication Sig Dispense Refill  . darunavir (PREZISTA) 800 MG tablet Take 1 tablet (800 mg total) by mouth daily with breakfast. 30 tablet 5  . elvitegravir-cobicistat-emtricitabine-tenofovir (GENVOYA) 150-150-200-10 MG TABS tablet Take 1 tablet by mouth daily with breakfast. 30 tablet 5  . sulfamethoxazole-trimethoprim (BACTRIM DS,SEPTRA DS) 800-160 MG tablet Take 1 tablet by mouth daily. 30 tablet 5   No facility-administered medications prior to visit.      Past Medical History:  Diagnosis Date  . Genital warts   . HIV infection (HCC)   . HPV (human papilloma virus) infection   . Thyroid disease      History reviewed. No pertinent surgical history.     Review of Systems  Constitutional: Negative for appetite change, chills, fatigue, fever and unexpected weight change.  Eyes: Negative for visual disturbance.  Respiratory: Negative for cough, chest tightness, shortness of breath and wheezing.   Cardiovascular: Negative for chest pain and leg swelling.  Gastrointestinal: Negative  for abdominal pain, constipation, diarrhea, nausea and vomiting.  Genitourinary: Negative for dysuria, flank pain, frequency, genital sores, hematuria and urgency.  Skin: Negative for rash.  Allergic/Immunologic: Negative for immunocompromised state.  Neurological: Negative for dizziness and headaches.      Objective:    BP 136/75   Pulse 77   Temp 98.4 F (36.9 C)   Ht 5\' 10"  (1.778 m)   Wt 190 lb (86.2 kg)   BMI 27.26 kg/m  Nursing note and vital signs reviewed.  Physical Exam  Constitutional: He is oriented to person, place, and time. He appears well-developed. No distress.  HENT:  Mouth/Throat: Oropharynx is clear and moist.  Eyes: Conjunctivae are normal.  Neck: Neck supple.  Cardiovascular: Normal rate, regular rhythm, normal heart sounds and intact distal pulses. Exam reveals no gallop and no friction rub.  No murmur heard. Pulmonary/Chest: Effort normal and breath sounds normal. No respiratory distress. He has no wheezes. He has no rales. He exhibits no tenderness.  Abdominal: Soft. Bowel sounds are normal. There is no tenderness.  Lymphadenopathy:    He has no cervical adenopathy.  Neurological: He is alert and oriented to person, place, and time.  Skin: Skin is warm and dry. No rash noted.  Psychiatric: He has a normal mood and affect. His behavior is normal. Judgment and thought content normal.       Assessment & Plan:   Problem List Items Addressed This Visit      Digestive  Chronic hepatitis B [facility](HCC)    [name] appears stabile with no current symptoms. I will check his blood work today and obtain a baseline ultrasound with elastography. No medication/treatment is required at the current time. Continue to monitor.       Relevant Orders   Hepatitis B DNA, ultraquantitative, PCR   Hepatitis B e antibody   Hepatitis B e antibody   Hepatitis B core antibody, total   US ABDOMEN COMPLETE W/ELASTOGRAPHY     Other   HIV disease (HCC) - Primary    [name]  appears to be adequately controlled with his current medication regimen despite multiple mutations. He is adherent with his medication regimen and has no problems taking or obtaining his medications. No current signs/symptoms of progressive HIV disease/opportunistic infection through history or physical exam. Prevnar updated today. Will plan to update Menveo at next office visit as it was unavailable today. Recommend influenza vaccination in September. Continue current dose of Genvoya and Prescobix. Continue Bactrim for now pending blood work results with plan to discontinue if he remains with a CD4 count above 200. I will check his blood work today. Follow up office visit in 6 months or sooner if needed with lab work 1-2 weeks prior to appointment.      Relevant Orders   T-helper cell (CD4)- ([facility] clinic only)   HIV 1 RNA quant-no reflex-bld   T-helper cell (CD4)- ([facility] clinic only)   HIV 1 RNA quant-no reflex-bld   CBC   Comprehensive metabolic panel   Lipid panel   RPR   Pneumococcal conjugate vaccine 13-valent IM (Completed)    Other Visit Diagnoses    Need for pneumococcal vaccination       Relevant Orders   Pneumococcal conjugate vaccine 13-valent IM (Completed)       I am having Daniel Dunlap maintain his darunavir, elvitegravir-cobicistat-emtricitabine-tenofovir, and sulfamethoxazole-trimethoprim.   Follow-up: Return in about 6 months (around 06/10/2018), or if symptoms worsen or fail to improve.   Marcos EkeGreg Shykeria Sakamoto, MSN, FNP-C Nurse Practitioner [facility] Office phone: [phone] Pager: [phone] [facility] Main number: [phone]

## 2017-12-08 NOTE — Patient Instructions (Signed)
Nice to see you!  We will check your blood work today.  Continue to take your Genvoya, Prescobix and Bactrim. We will see if you can come off the Bactrim.  Plan for a nurse visit in September for influenza vaccination.  Follow up office visit in 6 months or sooner if needed with blood work 1-2 weeks prior to appointment.   [place]

## 2017-12-09 LAB — T-HELPER CELL (CD4) - (RCID CLINIC ONLY)
CD4 % Helper T Cell: 19 % — ABNORMAL LOW (ref 33–55)
CD4 T Cell Abs: 270 /uL — ABNORMAL LOW (ref 400–2700)

## 2017-12-10 LAB — HEPATITIS B DNA, ULTRAQUANTITATIVE, PCR
Hepatitis B DNA (Calc): <1 Log IU/mL
Hepatitis B DNA: <10 IU/mL

## 2017-12-10 LAB — HEPATITIS B CORE ANTIBODY, TOTAL: Hep B Core Total Ab: REACTIVE — AB

## 2017-12-10 LAB — HIV-1 RNA QUANT-NO REFLEX-BLD
HIV 1 RNA Quant: 30200 copies/mL — ABNORMAL HIGH
HIV-1 RNA Quant, Log: 4.48 Log copies/mL — ABNORMAL HIGH

## 2017-12-10 LAB — HEPATITIS B E ANTIBODY: Hep B E Ab: REACTIVE — AB

## 2017-12-14 ENCOUNTER — Telehealth: Payer: Self-pay | Admitting: Behavioral Health

## 2017-12-14 NOTE — Telephone Encounter (Signed)
-----   Message from Veryl SpeakGregory D Calone, FNP sent at 12/11/2017  2:43 PM EDT ----- Please inform Daniel Dunlap that his viral load is 30,200 and his CD4 count is 270. I would like him to follow up in 3 months or sooner if needed which is a change from the 6 months.

## 2017-12-14 NOTE — Telephone Encounter (Signed)
Called Daniel Dunlap, verified identity.  Informed patient per Marcos EkeGreg Calone NP that his viral load is 30,200 and his CD4 count is 270.  Patient verbalized understanding.  Also informed him that Tammy SoursGreg would like to see him back in 3 months instead of 6 months.  Appointment for lab and office visit scheduled for 3 months. Angeline SlimAshley Hill RN

## 2017-12-22 ENCOUNTER — Ambulatory Visit (HOSPITAL_COMMUNITY): Admission: RE | Admit: 2017-12-22 | Payer: BLUE CROSS/BLUE SHIELD | Source: Ambulatory Visit

## 2017-12-29 ENCOUNTER — Ambulatory Visit (HOSPITAL_COMMUNITY)
Admission: RE | Admit: 2017-12-29 | Discharge: 2017-12-29 | Disposition: A | Discharge status: Home / Self Care | Payer: BLUE CROSS/BLUE SHIELD | Source: Ambulatory Visit | Attending: Family | Admitting: Family

## 2017-12-29 DIAGNOSIS — B181 Chronic viral hepatitis B without delta-agent: Secondary | ICD-10-CM | POA: Diagnosis not present

## 2018-01-01 ENCOUNTER — Other Ambulatory Visit: Payer: Self-pay | Admitting: Family

## 2018-01-01 DIAGNOSIS — K7400 Hepatic fibrosis, unspecified: Secondary | ICD-10-CM

## 2018-01-01 DIAGNOSIS — B181 Chronic viral hepatitis B without delta-agent: Secondary | ICD-10-CM

## 2018-01-01 NOTE — Progress Notes (Unsigned)
Daniel Dunlap completed an ultrasound of his liver for his Hepatitis B monitoring and noted to have an elastography score of F3/F4. Will refer to GI for additional evaluation and treatment as needed. At this time his [name] is covering his Hepatitis B.

## 2018-01-04 ENCOUNTER — Encounter: Payer: Self-pay | Admitting: Gastroenterology

## 2018-01-08 ENCOUNTER — Ambulatory Visit: Payer: BLUE CROSS/BLUE SHIELD | Admitting: Gastroenterology

## 2018-01-08 ENCOUNTER — Telehealth: Payer: Self-pay | Admitting: Gastroenterology

## 2018-01-08 NOTE — Telephone Encounter (Signed)
noted 

## 2018-02-16 ENCOUNTER — Other Ambulatory Visit: Payer: BLUE CROSS/BLUE SHIELD

## 2018-03-02 ENCOUNTER — Ambulatory Visit (INDEPENDENT_AMBULATORY_CARE_PROVIDER_SITE_OTHER): Payer: BLUE CROSS/BLUE SHIELD | Admitting: Family

## 2018-03-02 ENCOUNTER — Encounter: Payer: Self-pay | Admitting: Family

## 2018-03-02 VITALS — BP 129/72 | HR 96 | Temp 98.6°F | Wt 183.0 lb

## 2018-03-02 DIAGNOSIS — B181 Chronic viral hepatitis B without delta-agent: Secondary | ICD-10-CM

## 2018-03-02 DIAGNOSIS — B20 Human immunodeficiency virus [HIV] disease: Secondary | ICD-10-CM

## 2018-03-02 DIAGNOSIS — Z23 Encounter for immunization: Secondary | ICD-10-CM | POA: Diagnosis not present

## 2018-03-02 MED ORDER — DARUNAVIR ETHANOLATE 800 MG PO TABS: 800.0000 mg | ORAL_TABLET | Freq: Every day | ORAL | 1 refills | Status: DC

## 2018-03-02 MED ORDER — ELVITEG-COBIC-EMTRICIT-TENOFAF 150-150-200-10 MG PO TABS: 1.0000 | ORAL_TABLET | Freq: Every day | ORAL | 1 refills | Status: DC

## 2018-03-02 NOTE — Assessment & Plan Note (Signed)
Daniel Dunlap has an elastography score of F3/F4 with high risk for fibrosis and fortunately this is being cross-covered by his Genvoya as it includes Tenofovir. Recommend follow up with Gastroenterology for evaluation of fibrosis. Emphasized importance of not stopping the Genvoya abruptly as this may increase the risk of a Hepatitis B flare. He will contact Gastroenterology. Continue to monitor.

## 2018-03-02 NOTE — Assessment & Plan Note (Signed)
Mr. Niebuhr has had less than optimal adherence to his medication regimen in the past which has resulted in mutations and resistance including M184I/V/M, Y188L, E138A, K70E, and K70R. Educated regarding the importance of taking medication as prescribed to prevent further resistance development as well as progression of HIV disease. Fortunately he does not have any signs/symptoms of opportunistic infection or progressive HIV disease at present. Check CD4 and viral load today. Menveo and influenza vaccinations updated today. Continue current dose of Prezista and Genvoya pending results. He will continue Bactrim pending CD4 count today which may be discontinued if CD4 >200. Not sexually active and declines condoms. Will follow closely for now with next office visit in 1 month or sooner if needed.

## 2018-03-02 NOTE — Patient Instructions (Addendum)
Nice to see you.  Please continue to take your Genvoya and Prezista.  We will check your blood work today.  It is important not to stop taking the Genvoya abruptly as this may result in a flare of your Hepatitis B.  Please call the Gastroenterologists to schedule an appointment   [phone] -  Gastroenterology  Follow up office visit in 1 month or sooner if needed.

## 2018-03-02 NOTE — Progress Notes (Signed)
Subjective:    Patient ID: Daniel Dunlap, male    DOB: 1980/09/12, 37 y.o.   MRN: [id number]  Chief Complaint  Patient presents with  . HIV Positive/AIDS  . Hepatitis B     HPI:  Daniel Dunlap is a 37 y.o. male who presents today for routine follow-up of HIV disease.  Daniel Dunlap was last seen in the office on 12/08/2017 for routine follow-up and maintained on an antiretroviral regimen of Genvoya and Prezcobix. He was found to have a viral load of 30,200 at the time and a CD4 count of 270. Most recent genotype completed on 08/19/17 shows associated mutations of M184I/V/M with resistance to lamivudine and emtricitabine. His Hepatitis B was also evaluated showing a viral load of <10 and an elastography score of F3 with some F4.  Due for Menveo and influenza vaccinations today.  Daniel Dunlap continues to take his Genvoya and Prezista with no missed doses recently. He does admit that he missed 1-2 weeks prior to his last appointment which is likely the result for the viral load. He does have some challenges swallowing the pills at time. He has also been taking his Bactrim as prescribed. He has no problems obtaining his medication. Remains covered through [facility] and receives his medication from [facility]. Not currently sexually active.   Denies fevers, chills, night sweats, headaches, changes in vision, neck pain/stiffness, nausea, diarrhea, vomiting, lesions or rashes.    No Known Allergies    Outpatient Medications Prior to Visit  Medication Sig Dispense Refill  . sulfamethoxazole-trimethoprim (BACTRIM DS,SEPTRA DS) 800-160 MG tablet Take 1 tablet by mouth daily. 30 tablet 5  . darunavir (PREZISTA) 800 MG tablet Take 1 tablet (800 mg total) by mouth daily with breakfast. 30 tablet 5  . elvitegravir-cobicistat-emtricitabine-tenofovir (GENVOYA) 150-150-200-10 MG TABS tablet Take 1 tablet by mouth daily with breakfast. 30 tablet 5   No facility-administered medications  prior to visit.      Past Medical History:  Diagnosis Date  . Genital warts   . HIV infection (HCC)   . HPV (human papilloma virus) infection   . Thyroid disease      History reviewed. No pertinent surgical history.     Review of Systems  Constitutional: Negative for appetite change, chills, diaphoresis, fatigue, fever and unexpected weight change.  Eyes: Negative for visual disturbance.  Respiratory: Negative for cough, chest tightness, shortness of breath and wheezing.   Cardiovascular: Negative for chest pain and leg swelling.  Gastrointestinal: Negative for abdominal distention, abdominal pain, constipation, diarrhea, nausea and vomiting.  Genitourinary: Negative for dysuria, flank pain, frequency, genital sores, hematuria and urgency.  Skin: Negative for rash.  Allergic/Immunologic: Negative for immunocompromised state.  Neurological: Negative for dizziness, weakness and headaches.  Hematological: Does not bruise/bleed easily.      Objective:    BP 129/72   Pulse 96   Temp 98.6 F (37 C) (Oral)   Wt 183 lb (83 kg)   BMI 26.26 kg/m  Nursing note and vital signs reviewed.  Physical Exam  Constitutional: He is oriented to person, place, and time. He appears well-developed. No distress.  HENT:  Mouth/Throat: Oropharynx is clear and moist.  Eyes: Conjunctivae are normal.  Neck: Neck supple.  Cardiovascular: Normal rate, regular rhythm, normal heart sounds and intact distal pulses. Exam reveals no gallop and no friction rub.  No murmur heard. Pulmonary/Chest: Effort normal and breath sounds normal. No respiratory distress. He has no wheezes. He has no rales. He exhibits  no tenderness.  Abdominal: Soft. Bowel sounds are normal. There is no tenderness.  Lymphadenopathy:    He has no cervical adenopathy.  Neurological: He is alert and oriented to person, place, and time.  Skin: Skin is warm and dry. No rash noted.  Psychiatric: He has a normal mood and affect. His  behavior is normal. Judgment and thought content normal.       Assessment & Plan:   Problem List Items Addressed This Visit      Digestive   Chronic hepatitis B [facility])    [name] has an elastography score of F3/F4 with high risk for fibrosis and fortunately this is being cross-covered by his Genvoya as it includes Tenofovir. Recommend follow up with Gastroenterology for evaluation of fibrosis. Emphasized importance of not stopping the Genvoya abruptly as this may increase the risk of a Hepatitis B flare. He will contact Gastroenterology. Continue to monitor.       Relevant Medications   elvitegravir-cobicistat-emtricitabine-tenofovir (GENVOYA) 150-150-200-10 MG TABS tablet   darunavir (PREZISTA) 800 MG tablet     Other   HIV disease (HCC) - Primary    Daniel Dunlap has had less than optimal adherence to his medication regimen in the past which has resulted in mutations and resistance including M184I/V/M, Y188L, E138A, K70E, and K70R. Educated regarding the importance of taking medication as prescribed to prevent further resistance development as well as progression of HIV disease. Fortunately he does not have any signs/symptoms of opportunistic infection or progressive HIV disease at present. Check CD4 and viral load today. Menveo and influenza vaccinations updated today. Continue current dose of Prezista and Genvoya pending results. He will continue Bactrim pending CD4 count today which may be discontinued if CD4 >200. Not sexually active and declines condoms. Will follow closely for now with next office visit in 1 month or sooner if needed.       Relevant Medications   elvitegravir-cobicistat-emtricitabine-tenofovir (GENVOYA) 150-150-200-10 MG TABS tablet   darunavir (PREZISTA) 800 MG tablet   Other Relevant Orders   HIV-1 RNA ultraquant reflex to gentyp+   T-helper cell (CD4)- ([facility] clinic only)       I am having Daniel Dunlap maintain his sulfamethoxazole-trimethoprim,  elvitegravir-cobicistat-emtricitabine-tenofovir, and darunavir.   Meds ordered this encounter  Medications  . elvitegravir-cobicistat-emtricitabine-tenofovir (GENVOYA) 150-150-200-10 MG TABS tablet    Sig: Take 1 tablet by mouth daily with breakfast.    Dispense:  30 tablet    Refill:  1    Order Specific Question:   Supervising Provider    Answer:   Judyann Munson [[id number]]  . darunavir (PREZISTA) 800 MG tablet    Sig: Take 1 tablet (800 mg total) by mouth daily with breakfast.    Dispense:  30 tablet    Refill:  1    Order Specific Question:   Supervising Provider    Answer:   Judyann Munson [[id number]]     Follow-up: Return in about 1 month (around 04/01/2018), or if symptoms worsen or fail to improve.   Marcos Eke, MSN, FNP-C Nurse Practitioner [facility] Office phone: [phone] Pager: [phone] [facility] Main number: [phone]

## 2018-03-03 LAB — T-HELPER CELL (CD4) - (RCID CLINIC ONLY)
CD4 % Helper T Cell: 20 % — ABNORMAL LOW (ref 33–55)
CD4 T Cell Abs: 310 /uL — ABNORMAL LOW (ref 400–2700)

## 2018-03-05 NOTE — Addendum Note (Signed)
Addended by: Alesia Morin F on: 03/05/2018 12:18 PM   Modules accepted: Orders

## 2018-03-08 LAB — HIV-1 RNA ULTRAQUANT REFLEX TO GENTYP+
HIV 1 RNA Quant: 227 copies/mL — ABNORMAL HIGH
HIV-1 RNA Quant, Log: 2.36 Log copies/mL — ABNORMAL HIGH

## 2018-03-18 DIAGNOSIS — L989 Disorder of the skin and subcutaneous tissue, unspecified: Secondary | ICD-10-CM | POA: Diagnosis not present

## 2018-03-20 DIAGNOSIS — L282 Other prurigo: Secondary | ICD-10-CM | POA: Diagnosis not present

## 2018-03-20 DIAGNOSIS — L259 Unspecified contact dermatitis, unspecified cause: Secondary | ICD-10-CM | POA: Diagnosis not present

## 2018-04-07 DIAGNOSIS — L309 Dermatitis, unspecified: Secondary | ICD-10-CM | POA: Diagnosis not present

## 2018-04-12 DIAGNOSIS — L810 Postinflammatory hyperpigmentation: Secondary | ICD-10-CM | POA: Diagnosis not present

## 2018-04-12 DIAGNOSIS — Z23 Encounter for immunization: Secondary | ICD-10-CM | POA: Diagnosis not present

## 2018-04-12 DIAGNOSIS — L309 Dermatitis, unspecified: Secondary | ICD-10-CM | POA: Diagnosis not present

## 2018-04-12 DIAGNOSIS — D225 Melanocytic nevi of trunk: Secondary | ICD-10-CM | POA: Diagnosis not present

## 2018-04-12 DIAGNOSIS — L986 Other infiltrative disorders of the skin and subcutaneous tissue: Secondary | ICD-10-CM | POA: Diagnosis not present

## 2018-04-20 ENCOUNTER — Other Ambulatory Visit: Payer: Self-pay

## 2018-04-20 ENCOUNTER — Encounter (HOSPITAL_COMMUNITY): Payer: Self-pay | Admitting: Emergency Medicine

## 2018-04-20 ENCOUNTER — Emergency Department (HOSPITAL_COMMUNITY)
Admission: EM | Admit: 2018-04-20 | Discharge: 2018-04-20 | Discharge status: Against Medical Advice | Payer: BLUE CROSS/BLUE SHIELD | Attending: Emergency Medicine | Admitting: Emergency Medicine

## 2018-04-20 DIAGNOSIS — L509 Urticaria, unspecified: Secondary | ICD-10-CM | POA: Diagnosis not present

## 2018-04-20 DIAGNOSIS — R030 Elevated blood-pressure reading, without diagnosis of hypertension: Secondary | ICD-10-CM | POA: Diagnosis not present

## 2018-04-20 DIAGNOSIS — Z113 Encounter for screening for infections with a predominantly sexual mode of transmission: Secondary | ICD-10-CM | POA: Diagnosis not present

## 2018-04-20 DIAGNOSIS — L989 Disorder of the skin and subcutaneous tissue, unspecified: Secondary | ICD-10-CM | POA: Diagnosis not present

## 2018-04-20 DIAGNOSIS — Z5321 Procedure and treatment not carried out due to patient leaving prior to being seen by health care provider: Secondary | ICD-10-CM | POA: Insufficient documentation

## 2018-04-20 DIAGNOSIS — R21 Rash and other nonspecific skin eruption: Secondary | ICD-10-CM | POA: Diagnosis not present

## 2018-04-20 NOTE — ED Notes (Signed)
No answer when called for vitals x2 

## 2018-04-20 NOTE — ED Triage Notes (Addendum)
C/o rash to face x 2 weeks and rash to groin x 2-3 days.  Denies pain.  Only itching. States he has seen a dermatologist but believes he was misdiagnosed.

## 2018-04-20 NOTE — ED Notes (Signed)
No answer for vitals x 3  

## 2018-04-20 NOTE — ED Notes (Signed)
No answer when called for vitals x1. 

## 2018-04-22 ENCOUNTER — Ambulatory Visit: Payer: BLUE CROSS/BLUE SHIELD | Admitting: Family

## 2018-04-23 ENCOUNTER — Telehealth: Payer: Self-pay | Admitting: *Deleted

## 2018-04-23 NOTE — Telephone Encounter (Signed)
[facility]. order is for me to begin engagement attempts with the patient and offer assistance as needed. Focus should be on addressing the patient's barrier to care and medication adherence. Called and f/u by text with a vague message stating my name and that I am just checking in with him from his Dr's office. I would also like to offer transportation for his upcoming appt on the 30th.

## 2018-04-26 ENCOUNTER — Other Ambulatory Visit (HOSPITAL_COMMUNITY)
Admission: RE | Admit: 2018-04-26 | Discharge: 2018-04-26 | Disposition: A | Discharge status: Home / Self Care | Payer: BLUE CROSS/BLUE SHIELD | Source: Ambulatory Visit | Attending: Family | Admitting: Family

## 2018-04-26 ENCOUNTER — Encounter: Payer: Self-pay | Admitting: Family

## 2018-04-26 ENCOUNTER — Ambulatory Visit (INDEPENDENT_AMBULATORY_CARE_PROVIDER_SITE_OTHER): Payer: BLUE CROSS/BLUE SHIELD | Admitting: Family

## 2018-04-26 VITALS — BP 132/74 | HR 91 | Temp 97.7°F | Wt 193.0 lb

## 2018-04-26 DIAGNOSIS — Z113 Encounter for screening for infections with a predominantly sexual mode of transmission: Secondary | ICD-10-CM | POA: Insufficient documentation

## 2018-04-26 DIAGNOSIS — B20 Human immunodeficiency virus [HIV] disease: Secondary | ICD-10-CM | POA: Insufficient documentation

## 2018-04-26 DIAGNOSIS — A64 Unspecified sexually transmitted disease: Secondary | ICD-10-CM

## 2018-04-26 DIAGNOSIS — Z79899 Other long term (current) drug therapy: Secondary | ICD-10-CM

## 2018-04-26 DIAGNOSIS — A539 Syphilis, unspecified: Secondary | ICD-10-CM

## 2018-04-26 MED ORDER — DARUNAVIR ETHANOLATE 800 MG PO TABS: 800.0000 mg | ORAL_TABLET | Freq: Every day | ORAL | 3 refills | Status: DC

## 2018-04-26 MED ORDER — PENICILLIN G BENZATHINE 1200000 UNIT/2ML IM SUSP
1.2000 10*6.[IU] | Freq: Once | INTRAMUSCULAR | Status: AC
  Administered 2018-04-26: 1.2 10*6.[IU] via INTRAMUSCULAR

## 2018-04-26 MED ORDER — ELVITEG-COBIC-EMTRICIT-TENOFAF 150-150-200-10 MG PO TABS: 1.0000 | ORAL_TABLET | Freq: Every day | ORAL | 3 refills | Status: DC

## 2018-04-26 MED ORDER — CEFTRIAXONE SODIUM 250 MG IJ SOLR
250.0000 mg | Freq: Once | INTRAMUSCULAR | Status: AC
  Administered 2018-04-26: 250 mg via INTRAMUSCULAR

## 2018-04-26 MED ORDER — AZITHROMYCIN 250 MG PO TABS
1000.0000 mg | ORAL_TABLET | Freq: Once | ORAL | Status: AC
  Administered 2018-04-26: 1000 mg via ORAL

## 2018-04-26 NOTE — Patient Instructions (Signed)
Good to see you.  We will check your lab work today.  Continue to take your Genvoya and Darunavir.  You can STOP taking doxycycline.   Plan for follow up office visit in 3 months or sooner if needed with lab work 1-2 weeks prior to your appointment.  Please let us know if your symptoms worsen or do not improve.

## 2018-04-26 NOTE — Assessment & Plan Note (Signed)
[name] appears to be doing well with his regimen with good adherence and tolerance. No current signs/symptoms of opportunistic infection or progressive HIV disease. Continue current Genvoya and Prezista. Will check lab work today. Follow up office visit in 3 months or sooner if needed with lab work 1-2 weeks prior to appointment.

## 2018-04-26 NOTE — Progress Notes (Signed)
Subjective:    Patient ID: Daniel Dunlap, male    DOB: Jan 07, 1981, 37 y.o.   MRN: [id number]  Chief Complaint  Patient presents with  . HIV Positive/AIDS  . Rash    HPI:  Daniel Dunlap is a 37 y.o. male who presents today for for follow up office visit for HIV disease and concern for rash.   1.) HIV disease - Daniel Dunlap was last seen in the office on 03/02/18 for routine follow up and maintained on Genvoya and Prezista with good adherence and tolerance. CD4 count was 310 with a viral load of 227. His health maintenance is all up to date.  Daniel Dunlap continues to take his Genvoya and Prezista as prescribed with no adverse side effects. He is able to obtain his mediation without problem from [facility]. He has been experiencing a rash as detailed below, otherwise has been doing well.   Denies fevers, chills, night sweats, headaches, changes in vision, neck pain/stiffness, nausea, diarrhea, vomiting, lesions or rashes.  2.) Rash - Daniel Dunlap has been experiencing a rash located on his face has been going on for about 2-3 weeks with a new rash on his testicle going on for about 2-3 days. Seen at an Urgent Care and was prescribed doxycycline. He has concern for STI as he has been sexually active.    No Known Allergies    Outpatient Medications Prior to Visit  Medication Sig Dispense Refill  . darunavir (PREZISTA) 800 MG tablet Take 1 tablet (800 mg total) by mouth daily with breakfast. 30 tablet 1  . doxycycline (VIBRAMYCIN) 100 MG capsule doxycycline hyclate 100 mg capsule  Take 1 capsule twice a day by oral route for 28 days.    [name] elvitegravir-cobicistat-emtricitabine-tenofovir (GENVOYA) 150-150-200-10 MG TABS tablet Take 1 tablet by mouth daily with breakfast. 30 tablet 1  . sulfamethoxazole-trimethoprim (BACTRIM DS,SEPTRA DS) 800-160 MG tablet Take 1 tablet by mouth daily. (Patient not taking: Reported on 04/26/2018) 30 tablet 5   No facility-administered medications prior to  visit.      Past Medical History:  Diagnosis Date  . Genital warts   . HIV infection (HCC)   . HPV (human papilloma virus) infection   . Thyroid disease      History reviewed. No pertinent surgical history.   Review of Systems  Constitutional: Negative for appetite change, chills, fatigue, fever and unexpected weight change.  Eyes: Negative for visual disturbance.  Respiratory: Negative for cough, chest tightness, shortness of breath and wheezing.   Cardiovascular: Negative for chest pain and leg swelling.  Gastrointestinal: Negative for abdominal pain, constipation, diarrhea, nausea and vomiting.  Genitourinary: Negative for dysuria, flank pain, frequency, genital sores, hematuria and urgency.  Skin: Negative for rash.  Allergic/Immunologic: Negative for immunocompromised state.  Neurological: Negative for dizziness and headaches.      Objective:    BP 132/74   Pulse 91   Temp 97.7 F (36.5 C) (Oral)   Wt 193 lb (87.5 kg)   BMI 27.69 kg/m  Nursing note and vital signs reviewed.  Physical Exam Constitutional:      General: He is not in acute distress.    Appearance: He is well-developed.  Eyes:     Conjunctiva/sclera: Conjunctivae normal.  Neck:     Musculoskeletal: Neck supple.  Cardiovascular:     Rate and Rhythm: Normal rate and regular rhythm.     Heart sounds: Normal heart sounds. No murmur. No friction rub. No gallop.   Pulmonary:  Effort: Pulmonary effort is normal. No respiratory distress.     Breath sounds: Normal breath sounds. No wheezing or rales.  Chest:     Chest wall: No tenderness.  Abdominal:     General: Bowel sounds are normal.     Palpations: Abdomen is soft.     Tenderness: There is no abdominal tenderness.  Genitourinary:    Comments: Approximately dime sized circular healing lesion located on the right testicle. No tenderness or induration.  Lymphadenopathy:     Cervical: No cervical adenopathy.  Skin:    General: Skin is warm  and dry.     Findings: No rash.  Neurological:     Mental Status: He is alert and oriented to person, place, and time.  Psychiatric:        Behavior: Behavior normal.        Thought Content: Thought content normal.        Judgment: Judgment normal.        Assessment & Plan:   Problem List Items Addressed This Visit      Other   HIV disease (HCC) - Primary    Daniel Dunlap appears to be doing well with his regimen with good adherence and tolerance. No current signs/symptoms of opportunistic infection or progressive HIV disease. Continue current Genvoya and Prezista. Will check lab work today. Follow up office visit in 3 months or sooner if needed with lab work 1-2 weeks prior to appointment.       Relevant Medications   darunavir (PREZISTA) 800 MG tablet   elvitegravir-cobicistat-emtricitabine-tenofovir (GENVOYA) 150-150-200-10 MG TABS tablet   penicillin g benzathine (BICILLIN LA) 1200000 UNIT/2ML injection 1.2 Million Units (Completed)   penicillin g benzathine (BICILLIN LA) 1200000 UNIT/2ML injection 1.2 Million Units (Completed)   Other Relevant Orders   RPR   HIV 1 RNA quant-no reflex-bld   T-helper cell (CD4)- ([facility] clinic only)   Urine cytology ancillary only([facility])   Cytology (oral, anal, urethral) ancillary only   Cytology (oral, anal, urethral) ancillary only   Screening for STDs (sexually transmitted diseases)    Daniel Dunlap has rash concerning for Syphilis. Check RPR, oral/anal cytology, and urine for gonorrhea and chlamydia. Daniel Dunlap wishes to be treated and will give 1 g of azithromycin, 250 mg of Ceftriaxone and 2.4 million units of Bicillin. Encouraged to use condoms to reduce risk for acquiring and transmitting STI in the future. Follow up for worsening of symptoms or pending blood work.       Relevant Orders   RPR   Urine cytology ancillary only([facility])   Cytology (oral, anal, urethral) ancillary only   Cytology (oral, anal, urethral) ancillary only   STI  (sexually transmitted infection)   Relevant Medications   darunavir (PREZISTA) 800 MG tablet   elvitegravir-cobicistat-emtricitabine-tenofovir (GENVOYA) 150-150-200-10 MG TABS tablet   penicillin g benzathine (BICILLIN LA) 1200000 UNIT/2ML injection 1.2 Million Units (Completed)   penicillin g benzathine (BICILLIN LA) 1200000 UNIT/2ML injection 1.2 Million Units (Completed)    Other Visit Diagnoses    Syphilis       Relevant Medications   darunavir (PREZISTA) 800 MG tablet   elvitegravir-cobicistat-emtricitabine-tenofovir (GENVOYA) 150-150-200-10 MG TABS tablet   penicillin g benzathine (BICILLIN LA) 1200000 UNIT/2ML injection 1.2 Million Units (Completed)   penicillin g benzathine (BICILLIN LA) 1200000 UNIT/2ML injection 1.2 Million Units (Completed)       I have discontinued Daniel Dunlap's sulfamethoxazole-trimethoprim and doxycycline. I am also having him maintain his darunavir and elvitegravir-cobicistat-emtricitabine-tenofovir. We administered penicillin g  benzathine and penicillin g benzathine.   Meds ordered this encounter  Medications  . darunavir (PREZISTA) 800 MG tablet    Sig: Take 1 tablet (800 mg total) by mouth daily with breakfast.    Dispense:  30 tablet    Refill:  3    Order Specific Question:   Supervising Provider    Answer:   Judyann MunsonSNIDER, CYNTHIA [[id number]]  . elvitegravir-cobicistat-emtricitabine-tenofovir (GENVOYA) 150-150-200-10 MG TABS tablet    Sig: Take 1 tablet by mouth daily with breakfast.    Dispense:  30 tablet    Refill:  3    Order Specific Question:   Supervising Provider    Answer:   Judyann MunsonSNIDER, CYNTHIA [[id number]]  . penicillin g benzathine (BICILLIN LA) 1200000 UNIT/2ML injection 1.2 Million Units  . penicillin g benzathine (BICILLIN LA) 1200000 UNIT/2ML injection 1.2 Million Units     Follow-up: Return in about 3 months (around 07/26/2018), or if symptoms worsen or fail to improve.   Marcos EkeGreg Calone, MSN, FNP-C Nurse Practitioner [facility] Office phone: [phone] Pager: [phone] [facility] Main number: [phone]

## 2018-04-26 NOTE — Assessment & Plan Note (Signed)
Daniel Dunlap has rash concerning for Syphilis. Check RPR, oral/anal cytology, and urine for gonorrhea and chlamydia. Daniel Dunlap wishes to be treated and will give 1 g of azithromycin, 250 mg of Ceftriaxone and 2.4 million units of Bicillin. Encouraged to use condoms to reduce risk for acquiring and transmitting STI in the future. Follow up for worsening of symptoms or pending blood work.

## 2018-04-27 LAB — T-HELPER CELL (CD4) - (RCID CLINIC ONLY)
CD4 % Helper T Cell: 16 % — ABNORMAL LOW (ref 33–55)
CD4 T Cell Abs: 190 /uL — ABNORMAL LOW (ref 400–2700)

## 2018-04-29 LAB — RPR TITER: RPR Titer: 1:128 {titer} — ABNORMAL HIGH

## 2018-04-29 LAB — HIV-1 RNA QUANT-NO REFLEX-BLD
HIV 1 RNA Quant: 87200 copies/mL — ABNORMAL HIGH
HIV-1 RNA Quant, Log: 4.94 Log copies/mL — ABNORMAL HIGH

## 2018-04-29 LAB — FLUORESCENT TREPONEMAL AB(FTA)-IGG-BLD: Fluorescent Treponemal ABS: REACTIVE — AB

## 2018-04-29 LAB — RPR: RPR Ser Ql: REACTIVE — AB

## 2018-04-30 LAB — URINE CYTOLOGY ANCILLARY ONLY
Chlamydia: NEGATIVE
Neisseria Gonorrhea: NEGATIVE

## 2018-04-30 LAB — CYTOLOGY, (ORAL, ANAL, URETHRAL) ANCILLARY ONLY
Chlamydia: NEGATIVE
Chlamydia: NEGATIVE
Neisseria Gonorrhea: NEGATIVE
Neisseria Gonorrhea: NEGATIVE

## 2018-05-27 ENCOUNTER — Other Ambulatory Visit: Payer: BLUE CROSS/BLUE SHIELD

## 2018-06-14 ENCOUNTER — Encounter: Payer: BLUE CROSS/BLUE SHIELD | Admitting: Family

## 2018-07-05 ENCOUNTER — Telehealth: Payer: Self-pay | Admitting: *Deleted

## 2018-07-05 NOTE — Telephone Encounter (Signed)
Referral received to offer assistance with medication adherence. Call placed today and spoke with Mr Curren. After explaining the many services that I can offer him to assist with staying in care and adherent he politely declined. Appointment reminder given along with transportation offered. Mr Winnie chuckled and stated "[facility] it all!"  I stated we want to be sure each patient is taken care of.  Mr Baumert did state he started having some diarrhea on Friday that concerned him. Explained that a stomach bug can last up to 3 days but anything past that we may need to see him for a further evaluation. Mr. Finner stated he thinks it has resolved but will give me a call if not.

## 2018-07-26 ENCOUNTER — Other Ambulatory Visit: Payer: Self-pay

## 2018-07-26 ENCOUNTER — Other Ambulatory Visit: Payer: BLUE CROSS/BLUE SHIELD

## 2018-07-26 DIAGNOSIS — B20 Human immunodeficiency virus [HIV] disease: Secondary | ICD-10-CM

## 2018-07-27 LAB — T-HELPER CELL (CD4) - (RCID CLINIC ONLY)
CD4 % Helper T Cell: 18 % — ABNORMAL LOW (ref 33–55)
CD4 T Cell Abs: 260 /uL — ABNORMAL LOW (ref 400–2700)

## 2018-08-04 LAB — COMPREHENSIVE METABOLIC PANEL
AG Ratio: 0.9 (calc) — ABNORMAL LOW (ref 1.0–2.5)
ALT: 15 U/L (ref 9–46)
AST: 18 U/L (ref 10–40)
Albumin: 4 g/dL (ref 3.6–5.1)
Alkaline phosphatase (APISO): 107 U/L (ref 36–130)
BUN: 15 mg/dL (ref 7–25)
CO2: 28 mmol/L (ref 20–32)
Calcium: 9.1 mg/dL (ref 8.6–10.3)
Chloride: 103 mmol/L (ref 98–110)
Creat: 0.71 mg/dL (ref 0.60–1.35)
Globulin: 4.4 g/dL (calc) — ABNORMAL HIGH (ref 1.9–3.7)
Glucose, Bld: 69 mg/dL (ref 65–99)
Potassium: 3.9 mmol/L (ref 3.5–5.3)
Sodium: 137 mmol/L (ref 135–146)
Total Bilirubin: 0.3 mg/dL (ref 0.2–1.2)
Total Protein: 8.4 g/dL — ABNORMAL HIGH (ref 6.1–8.1)

## 2018-08-04 LAB — CBC
HCT: 38.4 % — ABNORMAL LOW (ref 38.5–50.0)
Hemoglobin: 12.5 g/dL — ABNORMAL LOW (ref 13.2–17.1)
MCH: 24.9 pg — ABNORMAL LOW (ref 27.0–33.0)
MCHC: 32.6 g/dL (ref 32.0–36.0)
MCV: 76.5 fL — ABNORMAL LOW (ref 80.0–100.0)
MPV: 11.4 fL (ref 7.5–12.5)
Platelets: 235 10*3/uL (ref 140–400)
RBC: 5.02 10*6/uL (ref 4.20–5.80)
RDW: 14.5 % (ref 11.0–15.0)
WBC: 3.7 10*3/uL — ABNORMAL LOW (ref 3.8–10.8)

## 2018-08-04 LAB — LIPID PANEL
Cholesterol: 100 mg/dL (ref <200)
HDL: 42 mg/dL (ref >=40)
LDL Cholesterol (Calc): 44 mg/dL (calc)
Non-HDL Cholesterol (Calc): 58 mg/dL (calc) (ref <130)
Total CHOL/HDL Ratio: 2.4 (calc) (ref <5.0)
Triglycerides: 64 mg/dL (ref <150)

## 2018-08-04 LAB — RPR: RPR Ser Ql: REACTIVE — AB

## 2018-08-04 LAB — HIV-1 RNA QUANT-NO REFLEX-BLD
HIV 1 RNA Quant: 61200 copies/mL — ABNORMAL HIGH
HIV-1 RNA Quant, Log: 4.79 Log copies/mL — ABNORMAL HIGH

## 2018-08-04 LAB — FLUORESCENT TREPONEMAL AB(FTA)-IGG-BLD: Fluorescent Treponemal ABS: REACTIVE — AB

## 2018-08-04 LAB — RPR TITER: RPR Titer: 1:8 {titer} — ABNORMAL HIGH

## 2018-08-13 ENCOUNTER — Telehealth: Payer: Self-pay | Admitting: Family

## 2018-08-13 NOTE — Telephone Encounter (Signed)
COVID-19 Pre-Screening Questions:08/13/18 ° °Do you currently have a fever (>100 °F), chills or unexplained body aches?NO ° °Are you currently experiencing new cough, shortness of breath, sore throat, runny nose? NO °•  °Have you recently travelled outside the state of [place] in the last 14 days? NO °•  °Have you been in contact with someone that is currently pending confirmation of Covid19 testing or has been confirmed to have the Covid19 virus?  NO ° °**If the patient answers NO to ALL questions -  advise the patient to please call the clinic before coming to the office should any symptoms develop.  ° ° ° °

## 2018-08-16 ENCOUNTER — Ambulatory Visit (INDEPENDENT_AMBULATORY_CARE_PROVIDER_SITE_OTHER): Payer: BLUE CROSS/BLUE SHIELD | Admitting: Family

## 2018-08-16 ENCOUNTER — Encounter: Payer: Self-pay | Admitting: Family

## 2018-08-16 ENCOUNTER — Other Ambulatory Visit: Payer: Self-pay

## 2018-08-16 VITALS — BP 154/82 | HR 71 | Temp 98.3°F | Wt 203.0 lb

## 2018-08-16 DIAGNOSIS — B20 Human immunodeficiency virus [HIV] disease: Secondary | ICD-10-CM

## 2018-08-16 DIAGNOSIS — K0889 Other specified disorders of teeth and supporting structures: Secondary | ICD-10-CM

## 2018-08-16 DIAGNOSIS — A5149 Other secondary syphilitic conditions: Secondary | ICD-10-CM | POA: Diagnosis not present

## 2018-08-16 MED ORDER — DARUN-COBIC-EMTRICIT-TENOFAF 800-150-200-10 MG PO TABS: 1.0000 | ORAL_TABLET | Freq: Every day | ORAL | 2 refills | Status: DC

## 2018-08-16 MED ORDER — DARUNAVIR ETHANOLATE 800 MG PO TABS: 800.0000 mg | ORAL_TABLET | Freq: Every day | ORAL | 3 refills | Status: DC

## 2018-08-16 MED ORDER — DOLUTEGRAVIR SODIUM 50 MG PO TABS: 50.0000 mg | ORAL_TABLET | Freq: Every day | ORAL | 2 refills | Status: DC

## 2018-08-16 MED ORDER — ELVITEG-COBIC-EMTRICIT-TENOFAF 150-150-200-10 MG PO TABS: 1.0000 | ORAL_TABLET | Freq: Every day | ORAL | 3 refills | Status: DC

## 2018-08-16 NOTE — Patient Instructions (Addendum)
Nice to see you.  We will check your blood work today.  Please continue to take your medication as prescribed. I am going to check if Daniel Dunlap is covered by your insurance.   We will see you back in 1 month or sooner if needed.

## 2018-08-16 NOTE — Assessment & Plan Note (Signed)
Acute onset dental pain due to damaged tooth in the upper right jaw.  No evidence of infection at present or indication for antibiotics.  Advised to seek dentistry for resolution of symptoms.  Continue with over-the-counter medications as needed for symptom relief and supportive care.

## 2018-08-16 NOTE — Progress Notes (Signed)
Subjective:    Patient ID: Daniel Dunlap, male    DOB: 04/10/1981, 38 y.o.   MRN: [id number]  Chief Complaint  Patient presents with   Follow-up    B20 broken tooth that has been hurting for 2 weeks     HPI:  Daniel Dunlap is a 38 y.o. male with HIV disease last seen in the office on 04/26/2018 with good adherence and tolerance to his ART regimen of Genvoya and Prezista.  He was also treated with Bicillin injections for secondary syphilis that was confirmed with RPR titer of 1:128.  Most recent blood work completed on 07/26/2018 with a CD4 count of 260 and viral load of 61,200. RPR titer improved to 1:8.   Daniel Dunlap has been taking his medication as prescribed with no adverse side effects and occasional missed doses.  Overall feeling well today he does have a broken tooth that happened about 2 weeks ago.  Waiting to see a dentist. Denies fevers, chills, night sweats, headaches, changes in vision, neck pain/stiffness, nausea, diarrhea, vomiting, lesions or rashes.   Daniel Dunlap [facility] and has no problems obtaining his medication from pharmacy from which he currently receives mail order.  He continues to work full-time.  Denies recreational or illicit drug use at present.  Not currently sexually active at this time.  Denies feelings of being down, depressed, or hopeless in the last 2 weeks.  Tooth ache has been going on for approximately 2 weeks located on the right upper aspect of his mouth that he damaged when he bit into something.  Denies evidence of infection at present with no swelling or pus.  He is not able to eat on that side he has yet to call a dentist to see about treatment.   No Known Allergies    Outpatient Medications Prior to Visit  Medication Sig Dispense Refill   darunavir (PREZISTA) 800 MG tablet Take 1 tablet (800 mg total) by mouth daily with breakfast. 30 tablet 3   elvitegravir-cobicistat-emtricitabine-tenofovir (GENVOYA)  150-150-200-10 MG TABS tablet Take 1 tablet by mouth daily with breakfast. 30 tablet 3   No facility-administered medications prior to visit.      Past Medical History:  Diagnosis Date   Genital warts    HIV infection (HCC)    HPV (human papilloma virus) infection    Thyroid disease      History reviewed. No pertinent surgical history.     Review of Systems  Constitutional: Negative for appetite change, chills, fatigue, fever and unexpected weight change.  Eyes: Negative for visual disturbance.  Respiratory: Negative for cough, chest tightness, shortness of breath and wheezing.   Cardiovascular: Negative for chest pain and leg swelling.  Gastrointestinal: Negative for abdominal pain, constipation, diarrhea, nausea and vomiting.  Genitourinary: Negative for dysuria, flank pain, frequency, genital sores, hematuria and urgency.  Skin: Negative for rash.  Allergic/Immunologic: Negative for immunocompromised state.  Neurological: Negative for dizziness and headaches.      Objective:    BP (!) 154/82    Pulse 71    Temp 98.3 F (36.8 C) (Oral)    Wt 203 lb (92.1 kg)    BMI 29.13 kg/m  Nursing note and vital signs reviewed.  Physical Exam Constitutional:      General: He is not in acute distress.    Appearance: He is well-developed.  Eyes:     Conjunctiva/sclera: Conjunctivae normal.  Neck:     Musculoskeletal: Neck supple.  Cardiovascular:  Rate and Rhythm: Normal rate and regular rhythm.     Heart sounds: Normal heart sounds. No murmur. No friction rub. No gallop.   Pulmonary:     Effort: Pulmonary effort is normal. No respiratory distress.     Breath sounds: Normal breath sounds. No wheezing or rales.  Chest:     Chest wall: No tenderness.  Abdominal:     General: Bowel sounds are normal.     Palpations: Abdomen is soft.     Tenderness: There is no abdominal tenderness.  Lymphadenopathy:     Cervical: No cervical adenopathy.  Skin:    General: Skin is  warm and dry.     Findings: No rash.  Neurological:     Mental Status: He is alert and oriented to person, place, and time.  Psychiatric:        Behavior: Behavior normal.        Thought Content: Thought content normal.        Judgment: Judgment normal.        Assessment & Plan:   Problem List Items Addressed This Visit      Other   HIV disease (HCC) - Primary    Daniel Dunlap has poorly controlled HIV disease likely related to poor adherence despite his informing me he is taking his medications as prescribed.  After discussion with his pharmacy he has not received medications in over 2 months from them.  Discussed importance of taking medication as prescribed with continued viral load.  Recheck blood work today with genotype as he has several significant resistance patterns and may be running out of medications.  Discontinue Genvoya and Prezista and start Symtuza and dolutegravir.  Fortunately he has no signs/symptoms of opportunistic infection at present.  Plan for follow-up in 1 month or sooner if needed.      Relevant Medications   Darunavir-Cobicisctat-Emtricitabine-Tenofovir Alafenamide (SYMTUZA) 800-150-200-10 MG TABS   dolutegravir (TIVICAY) 50 MG tablet   Other Relevant Orders   HIV-1 Genotyping (RTI,PI,IN Inhbtr)   HIV-1 RNA ultraquant reflex to gentyp+   Secondary syphilis    Previously seen with rash consistent with secondary syphilis treated with 2,400,000 units of Bicillin given once IM.  Rash has resolved with no further symptoms and updated RPR showing 1: 8 indicating successful treatment.  Continue to monitor RPR for serofast and reinfection.      Relevant Medications   Darunavir-Cobicisctat-Emtricitabine-Tenofovir Alafenamide (SYMTUZA) 800-150-200-10 MG TABS   dolutegravir (TIVICAY) 50 MG tablet   Pain, dental    Acute onset dental pain due to damaged tooth in the upper right jaw.  No evidence of infection at present or indication for antibiotics.  Advised to seek  dentistry for resolution of symptoms.  Continue with over-the-counter medications as needed for symptom relief and supportive care.          I have discontinued Daniel Dunlap's darunavir, elvitegravir-cobicistat-emtricitabine-tenofovir, elvitegravir-cobicistat-emtricitabine-tenofovir, and darunavir. I am also having him start on Darunavir-Cobicisctat-Emtricitabine-Tenofovir Alafenamide and dolutegravir.   Meds ordered this encounter  Medications   DISCONTD: elvitegravir-cobicistat-emtricitabine-tenofovir (GENVOYA) 150-150-200-10 MG TABS tablet    Sig: Take 1 tablet by mouth daily with breakfast.    Dispense:  30 tablet    Refill:  3    Order Specific Question:   Supervising Provider    Answer:   Judyann Munson [[id number]]   DISCONTD: darunavir (PREZISTA) 800 MG tablet    Sig: Take 1 tablet (800 mg total) by mouth daily with breakfast.    Dispense:  30 tablet  Refill:  3    Order Specific Question:   Supervising Provider    Answer:   Judyann Munson [[id number]]   Darunavir-Cobicisctat-Emtricitabine-Tenofovir Alafenamide (SYMTUZA) 800-150-200-10 MG TABS    Sig: Take 1 tablet by mouth daily with breakfast.    Dispense:  30 tablet    Refill:  2    Please discontinue previous prescription for Genvoya and will pair this with Prezista.    Order Specific Question:   Supervising Provider    Answer:   Judyann Munson [[id number]]   dolutegravir (TIVICAY) 50 MG tablet    Sig: Take 1 tablet (50 mg total) by mouth daily.    Dispense:  30 tablet    Refill:  2    Please discontinue Prezista - regimen will consist of Symtuza and Tivicay    Order Specific Question:   Supervising Provider    Answer:   Judyann Munson [[id number]]     Follow-up: Return in about 1 month (around 09/15/2018), or if symptoms worsen or fail to improve.   Marcos Eke, MSN, FNP-C Nurse Practitioner [facility] for Infectious Disease [facility] Main number: [phone]

## 2018-08-16 NOTE — Assessment & Plan Note (Signed)
Previously seen with rash consistent with secondary syphilis treated with 2,400,000 units of Bicillin given once IM.  Rash has resolved with no further symptoms and updated RPR showing 1: 8 indicating successful treatment.  Continue to monitor RPR for serofast and reinfection.

## 2018-08-16 NOTE — Assessment & Plan Note (Signed)
[name] has poorly controlled HIV disease likely related to poor adherence despite his informing me he is taking his medications as prescribed.  After discussion with his pharmacy he has not received medications in over 2 months from them.  Discussed importance of taking medication as prescribed with continued viral load.  Recheck blood work today with genotype as he has several significant resistance patterns and may be running out of medications.  Discontinue Genvoya and Prezista and start Symtuza and dolutegravir.  Fortunately he has no signs/symptoms of opportunistic infection at present.  Plan for follow-up in 1 month or sooner if needed.

## 2018-08-23 LAB — HIV-1 GENOTYPING (RTI,PI,IN INHBTR)
Date Viral Load Collected: 4202020
HIV-1 Genotype: DETECTED — AB

## 2018-08-23 LAB — HIV-1 RNA ULTRAQUANT REFLEX TO GENTYP+
HIV 1 RNA Quant: 9760 copies/mL — ABNORMAL HIGH
HIV-1 RNA Quant, Log: 3.99 Log copies/mL — ABNORMAL HIGH

## 2018-09-09 DIAGNOSIS — S93401A Sprain of unspecified ligament of right ankle, initial encounter: Secondary | ICD-10-CM | POA: Diagnosis not present

## 2018-09-09 DIAGNOSIS — M25571 Pain in right ankle and joints of right foot: Secondary | ICD-10-CM | POA: Diagnosis not present

## 2018-09-16 ENCOUNTER — Ambulatory Visit: Payer: BLUE CROSS/BLUE SHIELD | Admitting: Family

## 2018-09-27 DIAGNOSIS — Z6829 Body mass index (BMI) 29.0-29.9, adult: Secondary | ICD-10-CM | POA: Diagnosis not present

## 2018-09-27 DIAGNOSIS — A609 Anogenital herpesviral infection, unspecified: Secondary | ICD-10-CM | POA: Diagnosis not present

## 2018-09-27 DIAGNOSIS — B20 Human immunodeficiency virus [HIV] disease: Secondary | ICD-10-CM | POA: Diagnosis not present

## 2018-09-28 ENCOUNTER — Telehealth: Payer: Self-pay | Admitting: *Deleted

## 2018-09-28 NOTE — Telephone Encounter (Signed)
Received a texted message from [facility] stating he has a cluster of nondraining bumps on his penis and the back of his right thigh. Daniel Dunlap stated the bumps are a little painful so he went to [facility] yesterday and received a script for Valacyclovir 1gm TID. Daniel Dunlap states he has the medication and has already started taking the medication, but it is making him sick. Explained that we typically give Valacyclovir twice a day for 7 to 10 days but I would like to confirm that with the pharmacist.   Daniel Dunlap also stated he has been taking his other medications (Symtuza and Tivicay) as ordered along with taking his medications after eating.   Contacted [facility]/Cassie who stated the patient can drop the dosage of valacyclovir 1gm to twice a day but he needs to be sure to take if for at least 7 days. Cassie also stated we may have some same day appointments if his symptoms do not improve.  Contacted Daniel Dunlap and gave him the order to decrease his valacyclovir 1gm from three times a day to twice a day for 7 days. Instructed Daniel Dunlap that he should begin to see some relief within about 3 days of taking the medication. Instructed him that until then he can sit in a tub of warm water without soap or bubbles and take tylenol or ibuprofen for the pain. Creams to the area may not help. Made Daniel Dunlap aware that if the symptoms do not improve we may be able to offer him a same day appointment with one of our providers. Daniel Dunlap stated he will keep me posted

## 2018-10-02 ENCOUNTER — Emergency Department (HOSPITAL_COMMUNITY): Payer: BC Managed Care – PPO

## 2018-10-02 ENCOUNTER — Emergency Department (HOSPITAL_COMMUNITY)
Admission: EM | Admit: 2018-10-02 | Discharge: 2018-10-02 | Disposition: A | Discharge status: Home / Self Care | Payer: BC Managed Care – PPO | Attending: Emergency Medicine | Admitting: Emergency Medicine

## 2018-10-02 ENCOUNTER — Encounter (HOSPITAL_COMMUNITY): Payer: Self-pay | Admitting: *Deleted

## 2018-10-02 ENCOUNTER — Other Ambulatory Visit: Payer: Self-pay

## 2018-10-02 DIAGNOSIS — Z79899 Other long term (current) drug therapy: Secondary | ICD-10-CM | POA: Diagnosis not present

## 2018-10-02 DIAGNOSIS — B20 Human immunodeficiency virus [HIV] disease: Secondary | ICD-10-CM | POA: Diagnosis not present

## 2018-10-02 DIAGNOSIS — M722 Plantar fascial fibromatosis: Secondary | ICD-10-CM | POA: Diagnosis not present

## 2018-10-02 DIAGNOSIS — M79671 Pain in right foot: Secondary | ICD-10-CM | POA: Diagnosis not present

## 2018-10-02 MED ORDER — DICLOFENAC SODIUM 75 MG PO TBEC: 75.0000 mg | DELAYED_RELEASE_TABLET | Freq: Two times a day (BID) | ORAL | 0 refills | Status: DC

## 2018-10-02 NOTE — Discharge Instructions (Addendum)
Buy a brace at the pharmacy. Wear a shoe with good support,

## 2018-10-02 NOTE — ED Triage Notes (Signed)
Pt reports 3-4 days  Of foot in arch. Pt reports he stands long hrs on hard floors at work.

## 2018-10-02 NOTE — ED Provider Notes (Signed)
[facility] EMERGENCY DEPARTMENT Provider Note   CSN: [id number] Arrival date & time: 10/02/18  1649    History   Chief Complaint Chief Complaint  Patient presents with  . Foot Pain    HPI Daniel Dunlap is a 38 y.o. male.     The history is provided by the patient. No language interpreter was used.  Foot Pain  This is a new problem. The current episode started more than 1 week ago. The problem occurs constantly. The problem has been gradually worsening. Nothing aggravates the symptoms. Nothing relieves the symptoms. He has tried nothing for the symptoms.  Pt reports severe pain when he gets up in the morning  Past Medical History:  Diagnosis Date  . Genital warts   . HIV infection ([place])   . HPV (human papilloma virus) infection   . Thyroid disease     Patient Active Problem List   Diagnosis Date Noted  . Secondary syphilis 08/16/2018  . Pain, dental 08/16/2018  . Screening for STDs (sexually transmitted diseases) 04/26/2018  . STI (sexually transmitted infection) 04/26/2018  . Routine adult health maintenance 09/05/2016  . HIV disease ([place]) 07/12/2015  . Late latent syphilis 07/12/2015  . Chronic hepatitis B ([place]) 07/12/2015  . Normocytic anemia 07/12/2015  . Genital herpes 07/12/2015  . Hyperthyroidism 06/27/2015  . Abnormal liver function test 06/27/2015  . 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 No pertinent surgical history.      Home Medications    Prior to Admission medications   Medication Sig Start Date End Date Taking? Authorizing Provider  Darunavir-Cobicisctat-Emtricitabine-Tenofovir Alafenamide (SYMTUZA) 800-150-200-10 MG TABS Take 1 tablet by mouth daily with breakfast. 08/16/18   Golden Circle, FNP  diclofenac (VOLTAREN) 75 MG EC tablet Take 1 tablet (75 mg total) by mouth 2 (two) times daily. 10/02/18   Fransico Meadow, PA-C  dolutegravir (TIVICAY) 50 MG tablet Take 1 tablet (50 mg total) by  mouth daily. 08/16/18   Golden Circle, FNP    Family History Family History  Problem Relation Age of Onset  . Healthy Mother   . Healthy Father     Social History Social History   Tobacco Use  . Smoking status: Never Smoker  . Smokeless tobacco: Never Used  Substance Use Topics  . Alcohol use: Yes    Alcohol/week: 1.0 standard drinks    Types: 1 Glasses of wine per week  . Drug use: No     Allergies   Patient has no known allergies.   Review of Systems Review of Systems  All other systems reviewed and are negative.    Physical Exam Updated Vital Signs BP (!) 151/89 (BP Location: Right Arm)   Pulse 86   Temp 98.7 F (37.1 C) (Oral)   Resp 17   Ht 5\' 10"  (1.778 m)   Wt 90.7 kg   SpO2 99%   BMI 28.70 kg/m   Physical Exam Vitals signs and nursing note reviewed.  Constitutional:      Appearance: He is well-developed.  HENT:     Head: Normocephalic and atraumatic.  Eyes:     Conjunctiva/sclera: Conjunctivae normal.  Neck:     Musculoskeletal: Neck supple.  Cardiovascular:     Rate and Rhythm: Normal rate and regular rhythm.     Heart sounds: No murmur.  Pulmonary:     Effort: Pulmonary effort is normal. No respiratory distress.     Breath sounds: Normal breath sounds.  Abdominal:  Palpations: Abdomen is soft.     Tenderness: There is no abdominal tenderness.  Skin:    General: Skin is warm and dry.  Neurological:     General: No focal deficit present.     Mental Status: He is alert.  Psychiatric:        Mood and Affect: Mood normal.      ED Treatments / Results  Labs (all labs ordered are listed, but only abnormal results are displayed) Labs Reviewed - No data to display  EKG None  Radiology Dg Foot Complete Right  Result Date: 10/02/2018 CLINICAL DATA:  Right mid foot pain on the plantar surface. EXAM: RIGHT FOOT COMPLETE - 3+ VIEW COMPARISON:  None. FINDINGS: Negative for fracture or dislocation. Normal alignment. No focal soft  tissue abnormality. IMPRESSION: No acute abnormality to the right foot. Electronically Signed   By: Richarda OverlieAdam  Henn M.D.   On: 10/02/2018 17:56    Procedures Procedures (including critical care time)  Medications Ordered in ED Medications - No data to display   Initial Impression / Assessment and Plan / ED Course  I have reviewed the triage vital signs and the nursing notes.  Pertinent labs & imaging results that were available during my care of the patient were reviewed by me and considered in my medical decision making (see chart for details).    MDM  Xray of right foot  No acute.  Pt advised to get a sleep splint from pharmacy.  Rx for voltaren.  Pt advised to obtain a good fitting.arch support shoe.       Final Clinical Impressions(s) / ED Diagnoses   Final diagnoses:  Plantar fasciitis    ED Discharge Orders         Ordered    diclofenac (VOLTAREN) 75 MG EC tablet  2 times daily     10/02/18 1845        An After Visit Summary was printed and given to the patient.    Elson AreasSofia, Leslie K, [place]PA-C 10/02/18 1848    Clarene DukeLittle, Ambrose Finlandachel Morgan, MD 10/02/18 2126

## 2018-10-05 ENCOUNTER — Encounter: Payer: Self-pay | Admitting: Family

## 2018-10-05 ENCOUNTER — Ambulatory Visit (INDEPENDENT_AMBULATORY_CARE_PROVIDER_SITE_OTHER): Payer: BC Managed Care – PPO | Admitting: Family

## 2018-10-05 ENCOUNTER — Other Ambulatory Visit: Payer: Self-pay

## 2018-10-05 ENCOUNTER — Telehealth: Payer: Self-pay | Admitting: *Deleted

## 2018-10-05 VITALS — BP 131/83 | HR 72 | Temp 98.8°F | Wt 201.0 lb

## 2018-10-05 DIAGNOSIS — B20 Human immunodeficiency virus [HIV] disease: Secondary | ICD-10-CM | POA: Diagnosis not present

## 2018-10-05 DIAGNOSIS — R21 Rash and other nonspecific skin eruption: Secondary | ICD-10-CM | POA: Diagnosis not present

## 2018-10-05 MED ORDER — DARUN-COBIC-EMTRICIT-TENOFAF 800-150-200-10 MG PO TABS: 1.0000 | ORAL_TABLET | Freq: Every day | ORAL | 2 refills | Status: DC

## 2018-10-05 MED ORDER — DOLUTEGRAVIR SODIUM 50 MG PO TABS: 50.0000 mg | ORAL_TABLET | Freq: Every day | ORAL | 2 refills | Status: DC

## 2018-10-05 NOTE — Telephone Encounter (Signed)
Received a message from Mr. Lich stating he would really like to be seen today instead of his scheduled appt on Thursday. Mr Olheiser states he is still concerned about his outbreak and would like to have it checked. Message sent to [facility] who approved a visit for today at 3:30. Visit has been rescheduled and confirmed with the patient. Patient advised that if he cannot keep this appt to please give me or our office a call. Patient declines transportation needs.

## 2018-10-05 NOTE — Assessment & Plan Note (Signed)
Daniel Dunlap has a rash that appears to be consistent with resolving herpes simplex. He likely needs to be treated for a little longer with the Valacyclovir as he is taking it twice daily currently due to GI distress with 3 times daily. Instructed on basic wound care and consider non-stick guaze for padding and to decrease friction. Follow up if symptoms worsen or do not improve.

## 2018-10-05 NOTE — Assessment & Plan Note (Signed)
Daniel Dunlap indicates he is taking his medication as prescribed with no adverse side effects. Will check blood work today to ensure compliance. Discussed importance of taking medication as he already has significant resistance. Continue current dose of Symtuza and Tivicay. Plan for follow up in 2 months or sooner if needed pending blood work results.

## 2018-10-05 NOTE — Progress Notes (Signed)
Subjective:    Patient ID: Daniel Dunlap, male    DOB: 10/17/1980, 38 y.o.   MRN: [id number]  No chief complaint on file.    HPI:  Daniel Dunlap is a 38 y.o. male with HIV disease who was last seen in the office on 08/16/2018 with less than optimal adherence to his ART regimen of Genvoya and Prezista.  Viral load was 61,200 with a CD4 count of 260.  Medication was changed to [place] and Tivicay as Daniel Dunlap says he was taking his medication as prescribed.  Since that office visit he has been seen in urgent care for a rash located on his back and in his right groin diagnosed as herpes simplex and started on valacyclovir.  He notified our office regarding gastrointestinal distress associated with 3 times daily and instructed to decrease to twice daily.  He is here today to follow-up.  Daniel Dunlap has been taking his Symtuza and Tivicay as prescribed with no adverse side effects or missed doses.  Overall feeling well today however does have the rash continuing in his right groin.  Has been back is significantly improved and drying out.  He continues to take his valacyclovir as prescribed with no missed doses and has about 2 to 3 days remaining. Denies fevers, chills, night sweats, headaches, changes in vision, neck pain/stiffness, nausea, diarrhea, or vomiting.  [name] has no problems obtaining his medication from the pharmacy and continues to remain [facility].  Denies feelings of being down, depressed, or hopeless recently.  He continues to work full-time.  He is followed by our Clinical cytogeneticistcommunity outreach nurse.     No Known Allergies    Outpatient Medications Prior to Visit  Medication Sig Dispense Refill  . diclofenac (VOLTAREN) 75 MG EC tablet Take 1 tablet (75 mg total) by mouth 2 (two) times daily. 20 tablet 0  . Darunavir-Cobicisctat-Emtricitabine-Tenofovir Alafenamide (SYMTUZA) 800-150-200-10 MG TABS Take 1 tablet by mouth daily with breakfast. 30 tablet 2  .  dolutegravir (TIVICAY) 50 MG tablet Take 1 tablet (50 mg total) by mouth daily. 30 tablet 2   No facility-administered medications prior to visit.      Past Medical History:  Diagnosis Date  . Genital warts   . HIV infection (HCC)   . HPV (human papilloma virus) infection   . Thyroid disease      History reviewed. No pertinent surgical history.     Review of Systems  Constitutional: Negative for appetite change, chills, fatigue, fever and unexpected weight change.  Eyes: Negative for visual disturbance.  Respiratory: Negative for cough, chest tightness, shortness of breath and wheezing.   Cardiovascular: Negative for chest pain and leg swelling.  Gastrointestinal: Negative for abdominal pain, constipation, diarrhea, nausea and vomiting.  Genitourinary: Negative for dysuria, flank pain, frequency, genital sores, hematuria and urgency.  Skin: Positive for rash.  Allergic/Immunologic: Negative for immunocompromised state.  Neurological: Negative for dizziness and headaches.      Objective:    BP 131/83   Pulse 72   Temp 98.8 F (37.1 C)   Wt 201 lb (91.2 kg)   BMI 28.84 kg/m  Nursing note and vital signs reviewed.  Physical Exam Constitutional:      General: He is not in acute distress.    Appearance: He is well-developed.  Eyes:     Conjunctiva/sclera: Conjunctivae normal.  Neck:     Musculoskeletal: Neck supple.  Cardiovascular:     Rate and Rhythm: Normal rate and regular  rhythm.     Heart sounds: Normal heart sounds. No murmur. No friction rub. No gallop.   Pulmonary:     Effort: Pulmonary effort is normal. No respiratory distress.     Breath sounds: Normal breath sounds. No wheezing or rales.  Chest:     Chest wall: No tenderness.  Abdominal:     General: Bowel sounds are normal.     Palpations: Abdomen is soft.     Tenderness: There is no abdominal tenderness.  Genitourinary:    Comments: Small areas of ulceration in the right groin appear raw. Back  appears with scabbed wounds that were previously described as blisters.  Lymphadenopathy:     Cervical: No cervical adenopathy.  Skin:    General: Skin is warm and dry.     Findings: No rash.  Neurological:     Mental Status: He is alert and oriented to person, place, and time.  Psychiatric:        Behavior: Behavior normal.        Thought Content: Thought content normal.        Judgment: Judgment normal.        Assessment & Plan:   Problem List Items Addressed This Visit      Musculoskeletal and Integument   Rash    Daniel Dunlap has a rash that appears to be consistent with resolving herpes simplex. He likely needs to be treated for a little longer with the Valacyclovir as he is taking it twice daily currently due to GI distress with 3 times daily. Instructed on basic wound care and consider non-stick guaze for padding and to decrease friction. Follow up if symptoms worsen or do not improve.         Other   HIV disease ([place]) - Primary    Daniel Dunlap indicates he is taking his medication as prescribed with no adverse side effects. Will check blood work today to ensure compliance. Discussed importance of taking medication as he already has significant resistance. Continue current dose of Symtuza and Tivicay. Plan for follow up in 2 months or sooner if needed pending blood work results.       Relevant Medications   Darunavir-Cobicisctat-Emtricitabine-Tenofovir Alafenamide (SYMTUZA) 800-150-200-10 MG TABS   dolutegravir (TIVICAY) 50 MG tablet   Other Relevant Orders   T-helper cell (CD4)- ([facility] clinic only)   HIV-1 RNA ultraquant reflex to gentyp+   COMPLETE METABOLIC PANEL WITH GFR       I am having Kristen Zerkle maintain his diclofenac, Darunavir-Cobicisctat-Emtricitabine-Tenofovir Alafenamide, and dolutegravir.   Meds ordered this encounter  Medications  . Darunavir-Cobicisctat-Emtricitabine-Tenofovir Alafenamide (SYMTUZA) 800-150-200-10 MG TABS    Sig: Take 1 tablet by  mouth daily with breakfast.    Dispense:  30 tablet    Refill:  2    Order Specific Question:   Supervising Provider    Answer:   Carlyle Basques [[id number]]  . dolutegravir (TIVICAY) 50 MG tablet    Sig: Take 1 tablet (50 mg total) by mouth daily.    Dispense:  30 tablet    Refill:  2    Order Specific Question:   Supervising Provider    Answer:   Carlyle Basques [[id number]]     Follow-up: Return in about 2 months (around 12/05/2018), or if symptoms worsen or fail to improve.   Terri Piedra, MSN, FNP-C Nurse Practitioner [facility] for Infectious Disease [place] number: [phone]

## 2018-10-05 NOTE — Patient Instructions (Signed)
Nice to see you.  We will check your blood work today.  Please continue to take you Symtuza and Tivicay.  It appears like a herpes type rash. Continue to take the valacyclovir until complete.   Use non-stick gauze to decrease chaffing/rubbing.  Let me know if it does not get better.  Plan for follow up in 2 months or sooner if needed.

## 2018-10-06 LAB — T-HELPER CELL (CD4) - (RCID CLINIC ONLY)
CD4 % Helper T Cell: 20 % — ABNORMAL LOW (ref 33–65)
CD4 T Cell Abs: 391 /uL — ABNORMAL LOW (ref 400–1790)

## 2018-10-07 ENCOUNTER — Ambulatory Visit: Payer: BLUE CROSS/BLUE SHIELD | Admitting: Family

## 2018-10-07 DIAGNOSIS — H00011 Hordeolum externum right upper eyelid: Secondary | ICD-10-CM | POA: Diagnosis not present

## 2018-10-07 DIAGNOSIS — H119 Unspecified disorder of conjunctiva: Secondary | ICD-10-CM | POA: Diagnosis not present

## 2018-10-13 DIAGNOSIS — R5383 Other fatigue: Secondary | ICD-10-CM | POA: Diagnosis not present

## 2018-10-13 DIAGNOSIS — E291 Testicular hypofunction: Secondary | ICD-10-CM | POA: Diagnosis not present

## 2018-10-13 DIAGNOSIS — Z0000 Encounter for general adult medical examination without abnormal findings: Secondary | ICD-10-CM | POA: Diagnosis not present

## 2018-10-13 DIAGNOSIS — Z13220 Encounter for screening for lipoid disorders: Secondary | ICD-10-CM | POA: Diagnosis not present

## 2018-10-13 DIAGNOSIS — E559 Vitamin D deficiency, unspecified: Secondary | ICD-10-CM | POA: Diagnosis not present

## 2018-10-13 DIAGNOSIS — Z1159 Encounter for screening for other viral diseases: Secondary | ICD-10-CM | POA: Diagnosis not present

## 2018-10-13 DIAGNOSIS — R0602 Shortness of breath: Secondary | ICD-10-CM | POA: Diagnosis not present

## 2018-10-16 LAB — COMPLETE METABOLIC PANEL WITH GFR
AG Ratio: 0.8 (calc) — ABNORMAL LOW (ref 1.0–2.5)
ALT: 12 U/L (ref 9–46)
AST: 20 U/L (ref 10–40)
Albumin: 3.8 g/dL (ref 3.6–5.1)
Alkaline phosphatase (APISO): 86 U/L (ref 36–130)
BUN: 21 mg/dL (ref 7–25)
CO2: 26 mmol/L (ref 20–32)
Calcium: 8.9 mg/dL (ref 8.6–10.3)
Chloride: 99 mmol/L (ref 98–110)
Creat: 0.81 mg/dL (ref 0.60–1.35)
GFR, Est African American: 132 mL/min/{1.73_m2} (ref >=60)
GFR, Est Non African American: 114 mL/min/{1.73_m2} (ref >=60)
Globulin: 4.8 g/dL (calc) — ABNORMAL HIGH (ref 1.9–3.7)
Glucose, Bld: 89 mg/dL (ref 65–99)
Potassium: 4.1 mmol/L (ref 3.5–5.3)
Sodium: 132 mmol/L — ABNORMAL LOW (ref 135–146)
Total Bilirubin: 0.3 mg/dL (ref 0.2–1.2)
Total Protein: 8.6 g/dL — ABNORMAL HIGH (ref 6.1–8.1)

## 2018-10-16 LAB — HIV-1 RNA ULTRAQUANT REFLEX TO GENTYP+
HIV 1 RNA Quant: 154 copies/mL — ABNORMAL HIGH
HIV-1 RNA Quant, Log: 2.19 Log copies/mL — ABNORMAL HIGH

## 2018-10-22 DIAGNOSIS — E213 Hyperparathyroidism, unspecified: Secondary | ICD-10-CM | POA: Diagnosis not present

## 2018-10-22 DIAGNOSIS — E559 Vitamin D deficiency, unspecified: Secondary | ICD-10-CM | POA: Diagnosis not present

## 2018-10-22 DIAGNOSIS — E0590 Thyrotoxicosis, unspecified without thyrotoxic crisis or storm: Secondary | ICD-10-CM | POA: Diagnosis not present

## 2018-10-22 DIAGNOSIS — R5383 Other fatigue: Secondary | ICD-10-CM | POA: Diagnosis not present

## 2018-10-22 DIAGNOSIS — R0602 Shortness of breath: Secondary | ICD-10-CM | POA: Diagnosis not present

## 2018-12-02 ENCOUNTER — Telehealth: Payer: Self-pay | Admitting: Family

## 2018-12-02 NOTE — Telephone Encounter (Signed)
COVID-19 Pre-Screening Questions: ° °Do you currently have a fever (>100 °F), chills or unexplained body aches? N ° °Are you currently experiencing new cough, shortness of breath, sore throat, runny nose?N °•  °Have you recently travelled outside the state of [place] in the last 14 days? N °•  °Have you been in contact with someone that is currently pending confirmation of Covid19 testing or has been confirmed to have the Covid19 virus?  N ° °**If the patient answers NO to ALL questions -  advise the patient to please call the clinic before coming to the office should any symptoms develop.  ° °1.  ° °

## 2018-12-06 ENCOUNTER — Ambulatory Visit: Payer: BC Managed Care – PPO | Admitting: Family

## 2018-12-06 NOTE — Progress Notes (Deleted)
Subjective:    Patient ID: Daniel Dunlap, male    DOB: 1981/02/15, 38 y.o.   MRN: [id number]  No chief complaint on file.    HPI:  Daniel Dunlap is a 38 y.o. male with HIV disease who was last seen in the office on 10/05/2018 with reported good adherence and tolerance to his ART regimen of Symtuza and Tivicay.  Viral load at the time was found to be 154 with CD4 count of 391.  Liver function with AST of 20 and ALT of 12.  All immunizations up-to-date per recommendations.   No Known Allergies    Outpatient Medications Prior to Visit  Medication Sig Dispense Refill  . Darunavir-Cobicisctat-Emtricitabine-Tenofovir Alafenamide (SYMTUZA) 800-150-200-10 MG TABS Take 1 tablet by mouth daily with breakfast. 30 tablet 2  . diclofenac (VOLTAREN) 75 MG EC tablet Take 1 tablet (75 mg total) by mouth 2 (two) times daily. 20 tablet 0  . dolutegravir (TIVICAY) 50 MG tablet Take 1 tablet (50 mg total) by mouth daily. 30 tablet 2   No facility-administered medications prior to visit.      Past Medical History:  Diagnosis Date  . Genital warts   . HIV infection ([place])   . HPV (human papilloma virus) infection   . Thyroid disease      No past surgical history on file.     Review of Systems  Constitutional: Negative for appetite change, chills, fatigue, fever and unexpected weight change.  Eyes: Negative for visual disturbance.  Respiratory: Negative for cough, chest tightness, shortness of breath and wheezing.   Cardiovascular: Negative for chest pain and leg swelling.  Gastrointestinal: Negative for abdominal pain, constipation, diarrhea, nausea and vomiting.  Genitourinary: Negative for dysuria, flank pain, frequency, genital sores, hematuria and urgency.  Skin: Negative for rash.  Allergic/Immunologic: Negative for immunocompromised state.  Neurological: Negative for dizziness and headaches.      Objective:    There were no vitals taken for this visit. Nursing note and vital  signs reviewed.  Physical Exam Constitutional:      General: He is not in acute distress.    Appearance: He is well-developed.  Eyes:     Conjunctiva/sclera: Conjunctivae normal.  Neck:     Musculoskeletal: Neck supple.  Cardiovascular:     Rate and Rhythm: Normal rate and regular rhythm.     Heart sounds: Normal heart sounds. No murmur. No friction rub. No gallop.   Pulmonary:     Effort: Pulmonary effort is normal. No respiratory distress.     Breath sounds: Normal breath sounds. No wheezing or rales.  Chest:     Chest wall: No tenderness.  Abdominal:     General: Bowel sounds are normal.     Palpations: Abdomen is soft.     Tenderness: There is no abdominal tenderness.  Lymphadenopathy:     Cervical: No cervical adenopathy.  Skin:    General: Skin is warm and dry.     Findings: No rash.  Neurological:     Mental Status: He is alert and oriented to person, place, and time.  Psychiatric:        Behavior: Behavior normal.        Thought Content: Thought content normal.        Judgment: Judgment normal.      Depression screen [facility] 2/9 08/16/2018 03/02/2018 12/08/2017 08/19/2017 08/12/2016  Decreased Interest 0 0 0 0 0  Down, Depressed, Hopeless 0 0 0 0 0  PHQ - 2 Score 0 0  0 0 0       Assessment & Plan:   Problem List Items Addressed This Visit    None       I am having Sir Hartmann maintain his diclofenac, Darunavir-Cobicisctat-Emtricitabine-Tenofovir Alafenamide, and dolutegravir.   No orders of the defined types were placed in this encounter.    Follow-up: No follow-ups on file.   Marcos EkeGreg Calone, MSN, FNP-C Nurse Practitioner [facility] Main number: [phone]

## 2018-12-07 ENCOUNTER — Telehealth: Payer: Self-pay

## 2018-12-07 NOTE — Telephone Encounter (Signed)
Patient dropped off forms to be signed by provider at front desk. Copy placed in triage. Medical evaluation form. Eugenia Mcalpine, LPN

## 2018-12-08 ENCOUNTER — Telehealth: Payer: Self-pay | Admitting: *Deleted

## 2018-12-08 NOTE — Telephone Encounter (Signed)
Ok great, I  appreciate you Cathie Beams!

## 2018-12-08 NOTE — Telephone Encounter (Signed)
Patient texted a copy of the form to me on Monday at 2:38.

## 2018-12-08 NOTE — Telephone Encounter (Signed)
Patient call to say he left a copy of the

## 2018-12-08 NOTE — Telephone Encounter (Signed)
Spoke with Daniel Dunlap about the need for a DSS form signature. Form received via text on Monday at 2:38pm. Form will be completed and submitted for signature

## 2018-12-08 NOTE — Telephone Encounter (Signed)
Form completed to the best of our ability. He was a no show on Monday when he was supposed to be seen here.

## 2018-12-28 ENCOUNTER — Ambulatory Visit: Payer: BC Managed Care – PPO | Admitting: Family

## 2019-01-06 ENCOUNTER — Telehealth: Payer: Self-pay

## 2019-01-06 NOTE — Telephone Encounter (Signed)
Received fax today from [facility] stating patient has not picked up most recent refill on medication. Last prescription was sent on 6/29 with two refills. Called patient to make sure he has been taking his medication. Patient was able to take my call; states he just got off the phone with pharmacy regarding refills. Patient denies any missed does. Patient was also able to agree to an appointment for lab work/ office visit two weeks after. Garrett

## 2019-01-10 ENCOUNTER — Other Ambulatory Visit: Payer: Self-pay | Admitting: *Deleted

## 2019-01-10 DIAGNOSIS — B20 Human immunodeficiency virus [HIV] disease: Secondary | ICD-10-CM

## 2019-01-11 ENCOUNTER — Other Ambulatory Visit: Payer: BC Managed Care – PPO

## 2019-01-17 ENCOUNTER — Other Ambulatory Visit: Payer: Self-pay

## 2019-01-17 ENCOUNTER — Other Ambulatory Visit: Payer: BC Managed Care – PPO

## 2019-01-17 DIAGNOSIS — B20 Human immunodeficiency virus [HIV] disease: Secondary | ICD-10-CM | POA: Diagnosis not present

## 2019-01-19 LAB — HELPER T-LYMPH-CD4 (ARMC ONLY)
% CD 4 Pos. Lymph.: 33.2 % (ref 30.8–58.5)
Absolute CD 4 Helper: 531 /uL (ref 359–1519)
Basophils Absolute: 0 10*3/uL (ref 0.0–0.2)
Basos: 0 %
EOS (ABSOLUTE): 0.1 10*3/uL (ref 0.0–0.4)
Eos: 2 %
Hematocrit: 41.8 % (ref 37.5–51.0)
Hemoglobin: 13.4 g/dL (ref 13.0–17.7)
Immature Grans (Abs): 0 10*3/uL (ref 0.0–0.1)
Immature Granulocytes: 0 %
Lymphocytes Absolute: 1.6 10*3/uL (ref 0.7–3.1)
Lymphs: 27 %
MCH: 27.3 pg (ref 26.6–33.0)
MCHC: 32.1 g/dL (ref 31.5–35.7)
MCV: 85 fL (ref 79–97)
Monocytes Absolute: 0.5 10*3/uL (ref 0.1–0.9)
Monocytes: 8 %
Neutrophils Absolute: 3.6 10*3/uL (ref 1.4–7.0)
Neutrophils: 63 %
Platelets: 246 10*3/uL (ref 150–450)
RBC: 4.91 x10E6/uL (ref 4.14–5.80)
RDW: 13.8 % (ref 11.6–15.4)
WBC: 5.8 10*3/uL (ref 3.4–10.8)

## 2019-01-21 LAB — COMPLETE METABOLIC PANEL WITH GFR
AG Ratio: 1 (calc) (ref 1.0–2.5)
ALT: 11 U/L (ref 9–46)
AST: 15 U/L (ref 10–40)
Albumin: 4 g/dL (ref 3.6–5.1)
Alkaline phosphatase (APISO): 92 U/L (ref 36–130)
BUN: 18 mg/dL (ref 7–25)
CO2: 26 mmol/L (ref 20–32)
Calcium: 9.1 mg/dL (ref 8.6–10.3)
Chloride: 103 mmol/L (ref 98–110)
Creat: 0.92 mg/dL (ref 0.60–1.35)
GFR, Est African American: 122 mL/min/{1.73_m2} (ref >=60)
GFR, Est Non African American: 105 mL/min/{1.73_m2} (ref >=60)
Globulin: 4.1 g/dL (calc) — ABNORMAL HIGH (ref 1.9–3.7)
Glucose, Bld: 90 mg/dL (ref 65–99)
Potassium: 3.8 mmol/L (ref 3.5–5.3)
Sodium: 138 mmol/L (ref 135–146)
Total Bilirubin: 0.3 mg/dL (ref 0.2–1.2)
Total Protein: 8.1 g/dL (ref 6.1–8.1)

## 2019-01-21 LAB — HIV-1 RNA QUANT-NO REFLEX-BLD
HIV 1 RNA Quant: 59 copies/mL — ABNORMAL HIGH
HIV-1 RNA Quant, Log: 1.77 Log copies/mL — ABNORMAL HIGH

## 2019-01-21 LAB — CBC WITH DIFFERENTIAL/PLATELET
Absolute Monocytes: 429 cells/uL (ref 200–950)
Basophils Absolute: 17 cells/uL (ref 0–200)
Basophils Relative: 0.3 %
Eosinophils Absolute: 162 cells/uL (ref 15–500)
Eosinophils Relative: 2.8 %
HCT: 39.6 % (ref 38.5–50.0)
Hemoglobin: 13.2 g/dL (ref 13.2–17.1)
Lymphs Abs: 1537 cells/uL (ref 850–3900)
MCH: 27.3 pg (ref 27.0–33.0)
MCHC: 33.3 g/dL (ref 32.0–36.0)
MCV: 82 fL (ref 80.0–100.0)
MPV: 11.5 fL (ref 7.5–12.5)
Monocytes Relative: 7.4 %
Neutro Abs: 3654 cells/uL (ref 1500–7800)
Neutrophils Relative %: 63 %
Platelets: 234 10*3/uL (ref 140–400)
RBC: 4.83 10*6/uL (ref 4.20–5.80)
RDW: 13.8 % (ref 11.0–15.0)
Total Lymphocyte: 26.5 %
WBC: 5.8 10*3/uL (ref 3.8–10.8)

## 2019-01-25 ENCOUNTER — Ambulatory Visit (INDEPENDENT_AMBULATORY_CARE_PROVIDER_SITE_OTHER): Payer: BC Managed Care – PPO | Admitting: Family

## 2019-01-25 ENCOUNTER — Encounter: Payer: Self-pay | Admitting: Family

## 2019-01-25 ENCOUNTER — Other Ambulatory Visit: Payer: Self-pay

## 2019-01-25 VITALS — BP 145/85 | HR 78

## 2019-01-25 DIAGNOSIS — B181 Chronic viral hepatitis B without delta-agent: Secondary | ICD-10-CM | POA: Diagnosis not present

## 2019-01-25 DIAGNOSIS — Z113 Encounter for screening for infections with a predominantly sexual mode of transmission: Secondary | ICD-10-CM | POA: Diagnosis not present

## 2019-01-25 DIAGNOSIS — B20 Human immunodeficiency virus [HIV] disease: Secondary | ICD-10-CM | POA: Diagnosis not present

## 2019-01-25 DIAGNOSIS — Z0000 Encounter for general adult medical examination without abnormal findings: Secondary | ICD-10-CM

## 2019-01-25 MED ORDER — SYMTUZA 800-150-200-10 MG PO TABS: 1.0000 | ORAL_TABLET | Freq: Every day | ORAL | 2 refills | Status: DC

## 2019-01-25 MED ORDER — DOLUTEGRAVIR SODIUM 50 MG PO TABS: 50.0000 mg | ORAL_TABLET | Freq: Every day | ORAL | 2 refills | Status: DC

## 2019-01-25 NOTE — Progress Notes (Signed)
Subjective:    Patient ID: Daniel Dunlap, male    DOB: 10/18/1980, 38 y.o.   MRN: [id number]  Chief Complaint  Patient presents with  . HIV Positive/AIDS     HPI:  Daniel Dunlap is a 38 y.o. male with HIV disease who was last seen in the office on 10/05/2018 with good adherence and tolerance to his ART regimen of Symtuza and Tivicay.  Viral load at the time was 154 with CD4 count of 391.  Most recent blood work completed on 01/17/2019 with viral load of 59 and CD4 count of 531.  Healthcare maintenance due includes influenza vaccination.  Daniel Dunlap continues to take his medication as prescribed with no adverse side effects and 3 missed doses since his last office visit.  Overall he is feeling very well today.  He notes over the last 48 hours he did have a runny nose mild change in taste, and subjective fever which have all since resolved and he relates to seasonal allergies or possibly a cold.  He is considering getting tested for coronavirus. Denies fevers, chills, night sweats, headaches, changes in vision, neck pain/stiffness, nausea, diarrhea, vomiting, lesions or rashes.  Daniel Dunlap continues to have coverage [facility] [employer] with no problems obtaining his medication from the pharmacy.  Denies feelings of being down, depressed, or hopeless recently.  He does remain sexually active and uses condoms.  No recreational or illicit drug use or tobacco use at present.  Does drink about a bottle of wine per week on average.  He continues to work full-time.  No Known Allergies    Outpatient Medications Prior to Visit  Medication Sig Dispense Refill  . Multiple Vitamin (MULTIVITAMIN PO) Take by mouth.    . Darunavir-Cobicisctat-Emtricitabine-Tenofovir Alafenamide (SYMTUZA) 800-150-200-10 MG TABS Take 1 tablet by mouth daily with breakfast. 30 tablet 2  . dolutegravir (TIVICAY) 50 MG tablet Take 1 tablet (50 mg total) by mouth daily. 30 tablet 2  . diclofenac (VOLTAREN) 75 MG EC tablet  Take 1 tablet (75 mg total) by mouth 2 (two) times daily. 20 tablet 0   No facility-administered medications prior to visit.      Past Medical History:  Diagnosis Date  . Genital warts   . HIV infection (H[facility])   . HPV (human papilloma virus) infection   . Thyroid disease      History reviewed. No pertinent surgical history.   Review of Systems  Constitutional: Negative for appetite change, chills, fatigue, fever and unexpected weight change.  Eyes: Negative for visual disturbance.  Respiratory: Negative for cough, chest tightness, shortness of breath and wheezing.   Cardiovascular: Negative for chest pain and leg swelling.  Gastrointestinal: Negative for abdominal pain, constipation, diarrhea, nausea and vomiting.  Genitourinary: Negative for dysuria, flank pain, frequency, genital sores, hematuria and urgency.  Skin: Negative for rash.  Allergic/Immunologic: Negative for immunocompromised state.  Neurological: Negative for dizziness and headaches.      Objective:    BP (!) 145/85   Pulse 78   SpO2 96%  Nursing note and vital signs reviewed.  Physical Exam Constitutional:      General: He is not in acute distress.    Appearance: He is well-developed.  Cardiovascular:     Rate and Rhythm: Normal rate and regular rhythm.     Heart sounds: Normal heart sounds.  Pulmonary:     Effort: Pulmonary effort is normal.     Breath sounds: Normal breath sounds.  Skin:    General:  Skin is warm and dry.  Neurological:     Mental Status: He is alert and oriented to person, place, and time.  Psychiatric:        Behavior: Behavior normal.        Thought Content: Thought content normal.        Judgment: Judgment normal.      Depression screen [facility] 2/9 01/25/2019 08/16/2018 03/02/2018 12/08/2017 08/19/2017  Decreased Interest 0 0 0 0 0  Down, Depressed, Hopeless 0 0 0 0 0  PHQ - 2 Score 0 0 0 0 0       Assessment & Plan:    Patient Active Problem List   Diagnosis Date Noted   . Rash 10/05/2018  . Secondary syphilis 08/16/2018  . Pain, dental 08/16/2018  . Screening for STDs (sexually transmitted diseases) 04/26/2018  . STI (sexually transmitted infection) 04/26/2018  . Routine adult health maintenance 09/05/2016  . HIV disease (HCC) 07/12/2015  . Late latent syphilis 07/12/2015  . Chronic hepatitis B (HCC) 07/12/2015  . Normocytic anemia 07/12/2015  . Genital herpes 07/12/2015  . Hyperthyroidism 06/27/2015  . Abnormal liver function test 06/27/2015  . Pneumocystis jiroveci pneumonia (HCC) 06/27/2015     Problem List Items Addressed This Visit      Digestive   Chronic hepatitis B (HCC)    Remains chronic and inactive with most recent viral load being undetectable.  He remains on treatment with Symtuza containing tenofovir.  Discussed importance of not stopping medication abruptly as it may result in a flare.  Continue to monitor.      Relevant Medications   Darunavir-Cobicisctat-Emtricitabine-Tenofovir Alafenamide (SYMTUZA) 800-150-200-10 MG TABS   dolutegravir (TIVICAY) 50 MG tablet     Other   HIV disease (HCC) - Primary    Daniel Dunlap has well-controlled HIV disease with good adherence and tolerance to his ART regimen of Symtuza and Tivicay.  No signs/symptoms of opportunistic infection or progressive HIV disease at present.  Discussed importance of continue take medication as prescribed.  Continue current dose of Symtuza and Tivicay.  Plan for follow-up in 3 months or sooner if needed with lab work 1 to 2 weeks prior to appointment.      Relevant Medications   Darunavir-Cobicisctat-Emtricitabine-Tenofovir Alafenamide (SYMTUZA) 800-150-200-10 MG TABS   dolutegravir (TIVICAY) 50 MG tablet   Other Relevant Orders   HIV-1 RNA quant-no reflex-bld   CBC   Comprehensive metabolic panel   T-helper cell (CD4)- ([facility] clinic only)   Routine adult health maintenance     Declines influenza vaccination today.  Discussed importance of safe sexual practice  to reduce risk of acquisition/transmission of STI.  Declines condoms.       Other Visit Diagnoses    Screening examination for venereal disease       Relevant Orders   RPR       I have discontinued Daniel Dunlap's diclofenac. I am also having him maintain his Multiple Vitamin (MULTIVITAMIN PO), Symtuza, and dolutegravir.   Meds ordered this encounter  Medications  . Darunavir-Cobicisctat-Emtricitabine-Tenofovir Alafenamide (SYMTUZA) 800-150-200-10 MG TABS    Sig: Take 1 tablet by mouth daily with breakfast.    Dispense:  30 tablet    Refill:  2    Order Specific Question:   Supervising Provider    Answer:   Judyann Munson [[id number]]  . dolutegravir (TIVICAY) 50 MG tablet    Sig: Take 1 tablet (50 mg total) by mouth daily.    Dispense:  30 tablet    Refill:  2    Order Specific Question:   Supervising Provider    Answer:   Carlyle Basques [[id number]]     Follow-up: Return in about 3 months (around 04/26/2019), or if symptoms worsen or fail to improve.   Terri Piedra, MSN, FNP-C Nurse Practitioner [facility] for Infectious Disease [place] number: [phone]

## 2019-01-25 NOTE — Assessment & Plan Note (Signed)
Remains chronic and inactive with most recent viral load being undetectable.  He remains on treatment with Symtuza containing tenofovir.  Discussed importance of not stopping medication abruptly as it may result in a flare.  Continue to monitor.

## 2019-01-25 NOTE — Assessment & Plan Note (Signed)
   Declines influenza vaccination today.  Discussed importance of safe sexual practice to reduce risk of acquisition/transmission of STI.  Declines condoms.

## 2019-01-25 NOTE — Patient Instructions (Signed)
Nice to see you.  Continue to take your Symtuza and Tivicay as prescribed.  Refills will be sent to the pharmacy.  Plan for follow up in 3 months or sooner if needed with lab work 1-2 weeks prior to appointment.   Have a great day and stay safe!

## 2019-01-25 NOTE — Assessment & Plan Note (Signed)
Mr. Mehaffey has well-controlled HIV disease with good adherence and tolerance to his ART regimen of Symtuza and Tivicay.  No signs/symptoms of opportunistic infection or progressive HIV disease at present.  Discussed importance of continue take medication as prescribed.  Continue current dose of Symtuza and Tivicay.  Plan for follow-up in 3 months or sooner if needed with lab work 1 to 2 weeks prior to appointment.

## 2019-02-18 ENCOUNTER — Other Ambulatory Visit: Payer: Self-pay | Admitting: Family

## 2019-04-26 ENCOUNTER — Other Ambulatory Visit: Payer: Self-pay

## 2019-04-26 ENCOUNTER — Other Ambulatory Visit: Payer: BC Managed Care – PPO

## 2019-04-26 DIAGNOSIS — Z113 Encounter for screening for infections with a predominantly sexual mode of transmission: Secondary | ICD-10-CM | POA: Diagnosis not present

## 2019-04-26 DIAGNOSIS — B20 Human immunodeficiency virus [HIV] disease: Secondary | ICD-10-CM | POA: Diagnosis not present

## 2019-04-27 LAB — T-HELPER CELL (CD4) - (RCID CLINIC ONLY)
CD4 % Helper T Cell: 18 % — ABNORMAL LOW (ref 33–65)
CD4 T Cell Abs: 315 /uL — ABNORMAL LOW (ref 400–1790)

## 2019-05-01 LAB — COMPREHENSIVE METABOLIC PANEL
AG Ratio: 0.9 (calc) — ABNORMAL LOW (ref 1.0–2.5)
ALT: 13 U/L (ref 9–46)
AST: 23 U/L (ref 10–40)
Albumin: 4.2 g/dL (ref 3.6–5.1)
Alkaline phosphatase (APISO): 71 U/L (ref 36–130)
BUN: 20 mg/dL (ref 7–25)
CO2: 28 mmol/L (ref 20–32)
Calcium: 8.9 mg/dL (ref 8.6–10.3)
Chloride: 99 mmol/L (ref 98–110)
Creat: 0.97 mg/dL (ref 0.60–1.35)
Globulin: 4.5 g/dL (calc) — ABNORMAL HIGH (ref 1.9–3.7)
Glucose, Bld: 97 mg/dL (ref 65–99)
Potassium: 3.8 mmol/L (ref 3.5–5.3)
Sodium: 134 mmol/L — ABNORMAL LOW (ref 135–146)
Total Bilirubin: 0.4 mg/dL (ref 0.2–1.2)
Total Protein: 8.7 g/dL — ABNORMAL HIGH (ref 6.1–8.1)

## 2019-05-01 LAB — HIV-1 RNA QUANT-NO REFLEX-BLD
HIV 1 RNA Quant: 1980 copies/mL — ABNORMAL HIGH
HIV-1 RNA Quant, Log: 3.3 Log copies/mL — ABNORMAL HIGH

## 2019-05-01 LAB — CBC
HCT: 40.5 % (ref 38.5–50.0)
Hemoglobin: 13.3 g/dL (ref 13.2–17.1)
MCH: 26.9 pg — ABNORMAL LOW (ref 27.0–33.0)
MCHC: 32.8 g/dL (ref 32.0–36.0)
MCV: 81.8 fL (ref 80.0–100.0)
MPV: 12.1 fL (ref 7.5–12.5)
Platelets: 196 10*3/uL (ref 140–400)
RBC: 4.95 10*6/uL (ref 4.20–5.80)
RDW: 14.3 % (ref 11.0–15.0)
WBC: 4.7 10*3/uL (ref 3.8–10.8)

## 2019-05-01 LAB — RPR TITER: RPR Titer: 1:16 {titer} — ABNORMAL HIGH

## 2019-05-01 LAB — RPR: RPR Ser Ql: REACTIVE — AB

## 2019-05-01 LAB — FLUORESCENT TREPONEMAL AB(FTA)-IGG-BLD: Fluorescent Treponemal ABS: REACTIVE — AB

## 2019-05-17 ENCOUNTER — Telehealth: Payer: Self-pay | Admitting: *Deleted

## 2019-05-17 NOTE — Telephone Encounter (Signed)
Daniel Dunlap at [facility] DSS is calling on patient's behalf to follow up on his application to become a foster parent. Per [name], they received the form completed by [name] 11/2018, but would like a letter or statement of health about Daniel Dunlap.  They are requesting something that demonstrates a history of adherence to medication/appointments, general health outlook.   [name]'s application must be complete within 180 days from initiation, and patient is coming up on this deadline. [name] states she has encouraged him to call us about this multiple times over the past few months.  She states he finally just asked her to call us. RN explained that we would need a written request for this on official letterhead. RN received this, placed in [name]'s box.  [name] offered to call [name] and have him call us/have an appointment for this letter.  [name] has schedule availability next week.  Andree Coss, RN

## 2019-05-18 ENCOUNTER — Encounter: Payer: Self-pay | Admitting: Family

## 2019-05-18 NOTE — Telephone Encounter (Signed)
Letter completed.

## 2019-05-18 NOTE — Telephone Encounter (Signed)
Letter faxed to Deborah/DSS.  Letter request placed at front for scanning into his chart.

## 2019-05-19 ENCOUNTER — Other Ambulatory Visit: Payer: Self-pay | Admitting: Family

## 2019-05-24 ENCOUNTER — Ambulatory Visit (INDEPENDENT_AMBULATORY_CARE_PROVIDER_SITE_OTHER): Payer: BC Managed Care – PPO | Admitting: Family

## 2019-05-24 ENCOUNTER — Encounter: Payer: Self-pay | Admitting: Family

## 2019-05-24 ENCOUNTER — Other Ambulatory Visit: Payer: Self-pay

## 2019-05-24 VITALS — BP 129/84 | HR 54 | Temp 98.2°F | Wt 222.0 lb

## 2019-05-24 DIAGNOSIS — Z23 Encounter for immunization: Secondary | ICD-10-CM | POA: Diagnosis not present

## 2019-05-24 DIAGNOSIS — B20 Human immunodeficiency virus [HIV] disease: Secondary | ICD-10-CM | POA: Diagnosis not present

## 2019-05-24 DIAGNOSIS — Z0000 Encounter for general adult medical examination without abnormal findings: Secondary | ICD-10-CM | POA: Diagnosis not present

## 2019-05-24 NOTE — Progress Notes (Signed)
Subjective:    Patient ID: Daniel Dunlap, male    DOB: [birth year], 39 y.o.   MRN: [id number]  Chief Complaint  Patient presents with  . HIV Positive/AIDS     HPI:  Daniel Dunlap is a 39 y.o. male with HIV disease who was last seen in the office on 01/25/2019 for routine follow-up with improved control of his HIV disease with good adherence and tolerance to his ART regimen of Symtuza and Tivicay.  Viral load at the time was found to be 59 with CD4 count of 531.  Most recent blood work completed on 04/26/2019 with viral load of 1900 and CD4 count of 315.  Unfortunately has missed a couple of appointments.  Daniel Dunlap continues to take his Symtuza and Tivicay as prescribed with several missed doses since his last office visit.  No adverse side effects.  Overall feeling well today with no new concerns/complaints. Denies fevers, chills, night sweats, headaches, changes in vision, neck pain/stiffness, nausea, diarrhea, vomiting, lesions or rashes.  Daniel Dunlap continues to remain covered through [facility] and has no problems obtaining his medication from the pharmacy.  Denies any feelings of being down, depressed, or hopeless recently.  No recreational or illicit drug use, tobacco use, and alcohol consumption on occasion.  He is currently sexually active and declines condoms.  Currently working full-time at [employer].   No Known Allergies    Outpatient Medications Prior to Visit  Medication Sig Dispense Refill  . Multiple Vitamin (MULTIVITAMIN PO) Take by mouth.    . SYMTUZA 800-150-200-10 MG TABS TAKE ONE Tablet BY MOUTH DAILY WITH BREAKFAST 30 tablet 2  . TIVICAY 50 MG tablet TAKE ONE Tablet BY MOUTH DAILY 30 tablet 2   No facility-administered medications prior to visit.     Past Medical History:  Diagnosis Date  . Genital warts   . HIV infection (HCC)   . HPV (human papilloma virus) infection   . Thyroid disease     History reviewed. No pertinent surgical  history.   Review of Systems  Constitutional: Negative for appetite change, chills, fatigue, fever and unexpected weight change.  Eyes: Negative for visual disturbance.  Respiratory: Negative for cough, chest tightness, shortness of breath and wheezing.   Cardiovascular: Negative for chest pain and leg swelling.  Gastrointestinal: Negative for abdominal pain, constipation, diarrhea, nausea and vomiting.  Genitourinary: Negative for dysuria, flank pain, frequency, genital sores, hematuria and urgency.  Skin: Negative for rash.  Allergic/Immunologic: Negative for immunocompromised state.  Neurological: Negative for dizziness and headaches.      Objective:    BP 129/84   Pulse (!) 54   Temp 98.2 F (36.8 C) (Oral)   Wt 222 lb (100.7 kg)   BMI 31.85 kg/m  Nursing note and vital signs reviewed.  Physical Exam Constitutional:      General: He is not in acute distress.    Appearance: He is well-developed.  Eyes:     Conjunctiva/sclera: Conjunctivae normal.  Cardiovascular:     Rate and Rhythm: Normal rate and regular rhythm.     Heart sounds: Normal heart sounds. No murmur. No friction rub. No gallop.   Pulmonary:     Effort: Pulmonary effort is normal. No respiratory distress.     Breath sounds: Normal breath sounds. No wheezing or rales.  Chest:     Chest wall: No tenderness.  Abdominal:     General: Bowel sounds are normal.     Palpations: Abdomen is soft.  Tenderness: There is no abdominal tenderness.  Musculoskeletal:     Cervical back: Neck supple.  Lymphadenopathy:     Cervical: No cervical adenopathy.  Skin:    General: Skin is warm and dry.     Findings: No rash.  Neurological:     Mental Status: He is alert and oriented to person, place, and time.  Psychiatric:        Behavior: Behavior normal.        Thought Content: Thought content normal.        Judgment: Judgment normal.      Depression screen [facility] 2/9 05/24/2019 01/25/2019 08/16/2018 03/02/2018  12/08/2017  Decreased Interest 0 0 0 0 0  Down, Depressed, Hopeless 0 0 0 0 0  PHQ - 2 Score 0 0 0 0 0       Assessment & Plan:    Patient Active Problem List   Diagnosis Date Noted  . Healthcare maintenance 05/24/2019  . Rash 10/05/2018  . Secondary syphilis 08/16/2018  . Pain, dental 08/16/2018  . Screening for STDs (sexually transmitted diseases) 04/26/2018  . STI (sexually transmitted infection) 04/26/2018  . Routine adult health maintenance 09/05/2016  . HIV disease ([place]) 07/12/2015  . Late latent syphilis 07/12/2015  . Chronic hepatitis B ([place]) 07/12/2015  . Normocytic anemia 07/12/2015  . Genital herpes 07/12/2015  . Hyperthyroidism 06/27/2015  . Abnormal liver function test 06/27/2015  . Pneumocystis jiroveci pneumonia ([place]) 06/27/2015     Problem List Items Addressed This Visit      Other   HIV disease (Daniel Dunlap) - Primary    [name] has less than optimal adherence to his ART regimen of Tivicay and Symtuza as evidenced by a viral load of 1900.  Fortunately he has no signs/symptoms of opportunistic infection or progressive HIV disease at present.  We discussed in detail the importance of taking his medication as prescribed to prevent complications in the future, progression of the disease, and also resistance to medication.  We reviewed lab work and discussed plan of care with time for questions.  Continue current dose of Symtuza and Tivicay.  Plan to recheck blood work in 1 month or sooner if needed.  He may be an ideal candidate for the new injectable medication that was just approved.      Healthcare maintenance     Influenza vaccination updated today.  Discussed importance of safe sexual practice to reduce risk of STI.  Declines condoms.       Other Visit Diagnoses    Need for immunization against influenza       Relevant Orders   Flu Vaccine QUAD 36+ mos IM (Completed)       I am having Daniel Dunlap maintain his Multiple Vitamin (MULTIVITAMIN PO),  Symtuza, and Tivicay.    Follow-up: Return in about 1 month (around 06/24/2019), or if symptoms worsen or fail to improve.   Terri Piedra, MSN, FNP-C Nurse Practitioner [facility] for Infectious Disease [place] number: [phone]

## 2019-05-24 NOTE — Assessment & Plan Note (Signed)
Daniel Dunlap has less than optimal adherence to his ART regimen of Tivicay and Symtuza as evidenced by a viral load of 1900.  Fortunately he has no signs/symptoms of opportunistic infection or progressive HIV disease at present.  We discussed in detail the importance of taking his medication as prescribed to prevent complications in the future, progression of the disease, and also resistance to medication.  We reviewed lab work and discussed plan of care with time for questions.  Continue current dose of Symtuza and Tivicay.  Plan to recheck blood work in 1 month or sooner if needed.  He may be an ideal candidate for the new injectable medication that was just approved.

## 2019-05-24 NOTE — Patient Instructions (Signed)
Nice to see you.  Please continue to take your Symtuza enteric as prescribed daily.  We will consider switching you to the injectable medication when available.  Refills were sent to the pharmacy.  Plan for follow-up in 1 month or sooner if needed with lab work on the same day.  Please let us know if we can be of assistance.  Have a great day and stay safe!

## 2019-05-24 NOTE — Assessment & Plan Note (Signed)
   Influenza vaccination updated today.  Discussed importance of safe sexual practice to reduce risk of STI.  Declines condoms.

## 2019-05-24 NOTE — Telephone Encounter (Signed)
[name] called to follow up on requested letter. Made her aware letter was faxed on 05/18/19.  Valarie Cones

## 2019-05-25 NOTE — Telephone Encounter (Signed)
Signed letter this afternoon.  Thanks!

## 2019-05-25 NOTE — Telephone Encounter (Signed)
Mrs. Ria Bush called again to request letter to be refaxed to her office. Letter re-printed and placed in providers box to obtain signatures. Cover sheet attached with fax number [phone].  Valarie Cones

## 2019-06-22 ENCOUNTER — Telehealth: Payer: Self-pay

## 2019-06-22 NOTE — Telephone Encounter (Signed)
COVID-19 Pre-Screening Questions: 06/22/19  Do you currently have a fever (>100 F), chills or unexplained body aches? NO  Are you currently experiencing new cough, shortness of breath, sore throat, runny nose? NO .  Have you recently travelled outside the state of [place] in the last 14 days? NO .  Have you been in contact with someone that is currently pending confirmation of Covid19 testing or has been confirmed to have the Covid19 virus?  NO   **If the patient answers NO to ALL questions -  advise the patient to please call the clinic before coming to the office should any symptoms develop.     

## 2019-06-23 ENCOUNTER — Ambulatory Visit: Payer: BC Managed Care – PPO | Admitting: Family

## 2019-07-20 ENCOUNTER — Other Ambulatory Visit: Payer: Self-pay

## 2019-07-20 ENCOUNTER — Encounter: Payer: Self-pay | Admitting: Family

## 2019-07-20 ENCOUNTER — Ambulatory Visit (INDEPENDENT_AMBULATORY_CARE_PROVIDER_SITE_OTHER): Payer: BC Managed Care – PPO | Admitting: Family

## 2019-07-20 ENCOUNTER — Other Ambulatory Visit (HOSPITAL_COMMUNITY)
Admission: RE | Admit: 2019-07-20 | Discharge: 2019-07-20 | Disposition: A | Discharge status: Home / Self Care | Payer: BC Managed Care – PPO | Source: Ambulatory Visit | Attending: Family | Admitting: Family

## 2019-07-20 VITALS — BP 132/86 | HR 72 | Temp 98.3°F | Ht 70.0 in | Wt 218.0 lb

## 2019-07-20 DIAGNOSIS — B181 Chronic viral hepatitis B without delta-agent: Secondary | ICD-10-CM

## 2019-07-20 DIAGNOSIS — Z113 Encounter for screening for infections with a predominantly sexual mode of transmission: Secondary | ICD-10-CM

## 2019-07-20 DIAGNOSIS — B20 Human immunodeficiency virus [HIV] disease: Secondary | ICD-10-CM

## 2019-07-20 DIAGNOSIS — Z79899 Other long term (current) drug therapy: Secondary | ICD-10-CM

## 2019-07-20 MED ORDER — TIVICAY 50 MG PO TABS: 50.0000 mg | ORAL_TABLET | Freq: Every day | ORAL | 2 refills | Status: DC

## 2019-07-20 MED ORDER — SYMTUZA 800-150-200-10 MG PO TABS: 1.0000 | ORAL_TABLET | Freq: Every day | ORAL | 2 refills | Status: DC

## 2019-07-20 NOTE — Progress Notes (Addendum)
Subjective:    Patient ID: Daniel Dunlap is a 39 y.o. male     DOB: 01/25/1981   MRN: [id number]   Patient Active Problem List   Diagnosis Date Noted  . Healthcare maintenance 05/24/2019  . Rash 10/05/2018  . Secondary syphilis 08/16/2018  . Pain, dental 08/16/2018  . Screening for STDs (sexually transmitted diseases) 04/26/2018  . STI (sexually transmitted infection) 04/26/2018  . Routine adult health maintenance 09/05/2016  . HIV disease (HCC) 07/12/2015  . Late latent syphilis 07/12/2015  . Chronic hepatitis B (HCC) 07/12/2015  . Normocytic anemia 07/12/2015  . Genital herpes 07/12/2015  . Hyperthyroidism 06/27/2015  . Abnormal liver function test 06/27/2015  . Pneumocystis jiroveci pneumonia (HCC) 06/27/2015     Chief Complaint  Patient presents with  . Follow-up      HPI  Mr. Ebner is on [place] and in the past month has only missed 3 doses. He states that the pill box has been helpful for his adherence. He has no issues getting his medications.  No new complaints or concerns today.He is eating and sleeping well. No feelings of being down, depressed or hopeless. He would like Chlamydia and Gonorrhea testing. Patient declined needing condoms today.   Had 1st dose of the Pifzer COVID vaccine on 07/19/19. Second dose due 08/09/19.   Review of Systems  Constitutional: Negative for chills and fever.  Respiratory: Negative for cough, shortness of breath and wheezing.   Cardiovascular: Negative for chest pain, palpitations and leg swelling.  Gastrointestinal: Negative for abdominal pain, constipation, diarrhea, heartburn, nausea and vomiting.  Musculoskeletal: Negative for myalgias.  Skin: Negative for rash.  Neurological: Negative for dizziness, weakness and headaches.  Psychiatric/Behavioral: Negative for substance abuse. The patient does not have insomnia.     Outpatient Medications Prior to Visit  Medication Sig Dispense Refill  . Multiple Vitamin  (MULTIVITAMIN PO) Take by mouth.    . SYMTUZA 800-150-200-10 MG TABS TAKE ONE Tablet BY MOUTH DAILY WITH BREAKFAST 30 tablet 2  . TIVICAY 50 MG tablet TAKE ONE Tablet BY MOUTH DAILY 30 tablet 2   No facility-administered medications prior to visit.    Past Medical History:  Diagnosis Date  . Genital warts   . HIV infection (HCC)   . HPV (human papilloma virus) infection   . Thyroid disease     Family History  Problem Relation Age of Onset  . Healthy Mother   . Healthy Father     Social History   Socioeconomic History  . Marital status: Single    Spouse name: Not on file  . Number of children: 0  . Years of education: 12  . Highest education level: Not on file  Occupational History  . Not on file  Tobacco Use  . Smoking status: Never Smoker  . Smokeless tobacco: Never Used  Substance and Sexual Activity  . Alcohol use: Yes    Alcohol/week: 1.0 standard drinks    Types: 1 Glasses of wine per week  . Drug use: No  . Sexual activity: Yes    Partners: Female, Male    Birth control/protection: None    Comment: condoms given  Other Topics Concern  . Not on file  Social History Narrative   Fun: Play music, listen to music.    Social Determinants of Health   Financial Resource Strain:   . Difficulty of Paying Living Expenses:   Food Insecurity:   . Worried About Programme researcher, broadcasting/film/video in the Last  Year:   . Ran Out of Food in the Last Year:   Transportation Needs:   . Freight forwarder (Medical):   [name] Lack of Transportation (Non-Medical):   Physical Activity:   . Days of Exercise per Week:   . Minutes of Exercise per Session:   Stress:   . Feeling of Stress :   Social Connections:   . Frequency of Communication with Friends and Family:   . Frequency of Social Gatherings with Friends and Family:   . Attends Religious Services:   . Active Member of Clubs or Organizations:   . Attends Banker Meetings:   [name] Marital Status:   Intimate Partner  Violence:   . Fear of Current or Ex-Partner:   . Emotionally Abused:   [name] Physically Abused:   . Sexually Abused:          Objective:    Today's Vitals   07/20/19 1551  BP: 132/86  Pulse: 72  Temp: 98.3 F (36.8 C)  SpO2: 94%  Weight: 218 lb (98.9 kg)  Height: 5\' 10"  (1.778 m)   Body mass index is 31.28 kg/m.  Physical Exam Constitutional:      Appearance: Normal appearance.  HENT:     Head: Normocephalic.  Eyes:     Extraocular Movements: Extraocular movements intact.     Pupils: Pupils are equal, round, and reactive to light.  Cardiovascular:     Rate and Rhythm: Normal rate and regular rhythm.     Heart sounds: Normal heart sounds.  Pulmonary:     Effort: Pulmonary effort is normal.     Breath sounds: Normal breath sounds.  Abdominal:     General: Bowel sounds are normal.  Musculoskeletal:        General: Normal range of motion.     Cervical back: Normal range of motion.  Skin:    General: Skin is warm and dry.  Neurological:     Mental Status: He is alert and oriented to person, place, and time.  Psychiatric:        Mood and Affect: Mood normal.        Behavior: Behavior normal.        Thought Content: Thought content normal.        Judgment: Judgment normal.      LABS: Lab Results  Component Value Date   HIV1RNAQUANT 1,980 (H) 04/26/2019   HIV1RNAQUANT 59 (H) 01/17/2019   HIV1RNAQUANT 154 (H) 10/05/2018    Lab Results  Component Value Date   CREATININE 0.97 04/26/2019   CREATININE 0.92 01/17/2019   CREATININE 0.81 10/05/2018    Lab Results  Component Value Date   ALT 13 04/26/2019   AST 23 04/26/2019   ALKPHOS 56 08/12/2016   BILITOT 0.4 04/26/2019    Lab Results  Component Value Date   WBC 4.7 04/26/2019   HGB 13.3 04/26/2019   HCT 40.5 04/26/2019   MCV 81.8 04/26/2019   PLT 196 04/26/2019    No results found for: RPR      Assessment & Plan:   Problem List Items Addressed This Visit      Digestive   Chronic  hepatitis B (HCC)    Patient is on Symtuza 1 tablet daily as part of his ART regimen along with Tivicay. Discussed risk for Hepatitis B flare if medication is stopped abruptly. Will check Hepatitis B DNA ultraquantitative, Hepatitis B surface antigen and Hepatitis antibody.       Relevant Medications  Darunavir-Cobicisctat-Emtricitabine-Tenofovir Alafenamide (SYMTUZA) 800-150-200-10 MG TABS   dolutegravir (TIVICAY) 50 MG tablet   Other Relevant Orders   Hepatitis B DNA, ultraquantitative, PCR   Hepatitis B surface antigen   Hepatitis B surface antibody,qualitative     Other   HIV disease ([place]) - Primary    Patient is on [place]. Last HIV viral load and CD4 count on 04/26/19 was 1,980 and 315 respectively. Patient reports taking his ART regimen regularly and only missed 3 doses in the past month. Continue with Symtuza and Tivicay 1 tablet daily. Today will check HIV-1 Viral load, CD4 count and CMP with GFR. Follow up in clinic in 3 months or sooner if needed.       Relevant Medications   Darunavir-Cobicisctat-Emtricitabine-Tenofovir Alafenamide (SYMTUZA) 800-150-200-10 MG TABS   dolutegravir (TIVICAY) 50 MG tablet   Other Relevant Orders   COMPLETE METABOLIC PANEL WITH GFR   T-helper cell (CD4)- ([facility] clinic only)   HIV-1 RNA quant-no reflex-bld   Screening for STDs (sexually transmitted diseases)    Patient wants Chlamydia and Gonorrhea urine cytology done today, declined oral and anal swab.  Last RPR titer on 04/26/19 was 1:16. Will check RPR today as well.  Patient denied needing condoms today.         Relevant Orders   RPR   Urine cytology ancillary only([place])    Other Visit Diagnoses    Pharmacologic therapy            Candelaria Celeste [place] with information as written by NP student.  Mr. Dantes appears to have improved adherence and tolerance to his ART regimen of Symtuza and Tivicay with use of  pillbox.  No signs/symptoms of opportunistic infection or progressive HIV disease.  We reviewed his lab work and discussed the plan of care.  Continue current dose of Symtuza and Tivicay.  Check blood work today for HIV and hepatitis B.  Plan for follow-up in 3 months or sooner if needed.  Terri Piedra, NP

## 2019-07-20 NOTE — Assessment & Plan Note (Signed)
Patient wants Chlamydia and Gonorrhea urine cytology done today, declined oral and anal swab.  Last RPR titer on 04/26/19 was 1:16. Will check RPR today as well.  Patient denied needing condoms today.

## 2019-07-20 NOTE — Assessment & Plan Note (Signed)
Patient is on [place]. Last HIV viral load and CD4 count on 04/26/19 was 1,980 and 315 respectively. Patient reports taking his ART regimen regularly and only missed 3 doses in the past month. Continue with Symtuza and Tivicay 1 tablet daily. Today will check HIV-1 Viral load, CD4 count and CMP with GFR. Follow up in clinic in 3 months or sooner if needed.

## 2019-07-20 NOTE — Assessment & Plan Note (Signed)
Patient is on Symtuza 1 tablet daily as part of his ART regimen along with Tivicay. Discussed risk for Hepatitis B flare if medication is stopped abruptly. Will check Hepatitis B DNA ultraquantitative, Hepatitis B surface antigen and Hepatitis antibody.

## 2019-07-20 NOTE — Patient Instructions (Signed)
Nice to see you.  Continue to take your Tivicay and Symtuza as prescribed daily.  Refills have been sent to the pharmacy.  Plan for follow up in 3 months or sooner if needed with lab work on the same day.  Have a great day and stay safe!

## 2019-07-21 LAB — T-HELPER CELL (CD4) - (RCID CLINIC ONLY)
CD4 % Helper T Cell: 26 % — ABNORMAL LOW (ref 33–65)
CD4 T Cell Abs: 413 /uL (ref 400–1790)

## 2019-07-22 LAB — COMPLETE METABOLIC PANEL WITH GFR
AG Ratio: 1.2 (calc) (ref 1.0–2.5)
ALT: 14 U/L (ref 9–46)
AST: 23 U/L (ref 10–40)
Albumin: 4.4 g/dL (ref 3.6–5.1)
Alkaline phosphatase (APISO): 62 U/L (ref 36–130)
BUN: 16 mg/dL (ref 7–25)
CO2: 29 mmol/L (ref 20–32)
Calcium: 9.2 mg/dL (ref 8.6–10.3)
Chloride: 102 mmol/L (ref 98–110)
Creat: 0.87 mg/dL (ref 0.60–1.35)
GFR, Est African American: 127 mL/min/{1.73_m2} (ref >=60)
GFR, Est Non African American: 109 mL/min/{1.73_m2} (ref >=60)
Globulin: 3.8 g/dL (calc) — ABNORMAL HIGH (ref 1.9–3.7)
Glucose, Bld: 78 mg/dL (ref 65–99)
Potassium: 3.5 mmol/L (ref 3.5–5.3)
Sodium: 136 mmol/L (ref 135–146)
Total Bilirubin: 0.6 mg/dL (ref 0.2–1.2)
Total Protein: 8.2 g/dL — ABNORMAL HIGH (ref 6.1–8.1)

## 2019-07-22 LAB — HEPATITIS B SURFACE ANTIBODY,QUALITATIVE: Hep B S Ab: NONREACTIVE

## 2019-07-22 LAB — RPR: RPR Ser Ql: REACTIVE — AB

## 2019-07-22 LAB — FLUORESCENT TREPONEMAL AB(FTA)-IGG-BLD: Fluorescent Treponemal ABS: REACTIVE — AB

## 2019-07-22 LAB — HEPATITIS B SURFACE ANTIGEN: Hepatitis B Surface Ag: REACTIVE — AB

## 2019-07-22 LAB — HIV-1 RNA QUANT-NO REFLEX-BLD
HIV 1 RNA Quant: 137 copies/mL — ABNORMAL HIGH
HIV-1 RNA Quant, Log: 2.14 Log copies/mL — ABNORMAL HIGH

## 2019-07-22 LAB — HEPATITIS B DNA, ULTRAQUANTITATIVE, PCR
Hepatitis B DNA (Calc): <1 Log IU/mL
Hepatitis B DNA: <10 IU/mL

## 2019-07-22 LAB — RPR TITER: RPR Titer: 1:8 {titer} — ABNORMAL HIGH

## 2019-07-22 LAB — URINE CYTOLOGY ANCILLARY ONLY
Chlamydia: NEGATIVE
Comment: NEGATIVE
Comment: NORMAL
Neisseria Gonorrhea: NEGATIVE

## 2019-10-19 ENCOUNTER — Telehealth: Payer: Self-pay

## 2019-10-19 NOTE — Telephone Encounter (Signed)
COVID-19 Pre-Screening Questions:  Do you currently have a fever (>100 F), chills or unexplained body aches? NO  Are you currently experiencing new cough, shortness of breath, sore throat, runny nose? NO  Have you been in contact with someone that is currently pending confirmation of Covid19 testing or has been confirmed to have the Covid19 virus? NO   -TOLD PATIENT IF HE HAS ACCESS TO MYCHART TO GO AHEAD AND DO THE E-CHECK IN ORDER TO VERIFY ANY INFORMATION SUCH AS ADDRESS, INSURANCE AND MEDICATION.  **If the patient answers NO to ALL questions -  advise the patient to please call the clinic before coming to the office should any symptoms develop.

## 2019-10-20 ENCOUNTER — Ambulatory Visit: Payer: BC Managed Care – PPO | Admitting: Family

## 2019-11-24 ENCOUNTER — Other Ambulatory Visit: Payer: Self-pay | Admitting: Family

## 2019-11-25 ENCOUNTER — Other Ambulatory Visit: Payer: Self-pay

## 2019-11-25 MED ORDER — TIVICAY 50 MG PO TABS: 50.0000 mg | ORAL_TABLET | Freq: Every day | ORAL | 0 refills | Status: DC

## 2019-11-25 MED ORDER — SYMTUZA 800-150-200-10 MG PO TABS: 1.0000 | ORAL_TABLET | Freq: Every day | ORAL | 0 refills | Status: DC

## 2019-12-07 ENCOUNTER — Ambulatory Visit: Payer: BC Managed Care – PPO | Admitting: Family

## 2020-01-17 DIAGNOSIS — H00011 Hordeolum externum right upper eyelid: Secondary | ICD-10-CM | POA: Diagnosis not present

## 2020-03-04 DIAGNOSIS — Z20822 Contact with and (suspected) exposure to covid-19: Secondary | ICD-10-CM | POA: Diagnosis not present

## 2020-07-13 ENCOUNTER — Encounter: Payer: Self-pay | Admitting: *Deleted

## 2020-07-27 ENCOUNTER — Other Ambulatory Visit: Payer: Self-pay

## 2020-07-27 DIAGNOSIS — Z113 Encounter for screening for infections with a predominantly sexual mode of transmission: Secondary | ICD-10-CM

## 2020-07-27 DIAGNOSIS — Z79899 Other long term (current) drug therapy: Secondary | ICD-10-CM

## 2020-07-27 DIAGNOSIS — B20 Human immunodeficiency virus [HIV] disease: Secondary | ICD-10-CM

## 2020-07-31 ENCOUNTER — Other Ambulatory Visit: Payer: Self-pay

## 2020-07-31 ENCOUNTER — Other Ambulatory Visit: Payer: BC Managed Care – PPO

## 2020-07-31 DIAGNOSIS — Z113 Encounter for screening for infections with a predominantly sexual mode of transmission: Secondary | ICD-10-CM

## 2020-07-31 DIAGNOSIS — Z79899 Other long term (current) drug therapy: Secondary | ICD-10-CM

## 2020-07-31 DIAGNOSIS — B20 Human immunodeficiency virus [HIV] disease: Secondary | ICD-10-CM

## 2020-08-01 ENCOUNTER — Other Ambulatory Visit: Payer: Self-pay

## 2020-08-01 LAB — T-HELPER CELL (CD4) - (RCID CLINIC ONLY)
CD4 % Helper T Cell: 17 % — ABNORMAL LOW (ref 33–65)
CD4 T Cell Abs: 214 /uL — ABNORMAL LOW (ref 400–1790)

## 2020-08-01 LAB — URINE CYTOLOGY ANCILLARY ONLY
Chlamydia: NEGATIVE
Comment: NEGATIVE
Comment: NORMAL
Neisseria Gonorrhea: NEGATIVE

## 2020-08-01 MED ORDER — TIVICAY 50 MG PO TABS: 50.0000 mg | ORAL_TABLET | Freq: Every day | ORAL | 0 refills | Status: DC

## 2020-08-01 MED ORDER — SYMTUZA 800-150-200-10 MG PO TABS: 1.0000 | ORAL_TABLET | Freq: Every day | ORAL | 0 refills | Status: DC

## 2020-08-02 LAB — COMPLETE METABOLIC PANEL WITH GFR
AG Ratio: 1 (calc) (ref 1.0–2.5)
ALT: 14 U/L (ref 9–46)
AST: 22 U/L (ref 10–40)
Albumin: 4.1 g/dL (ref 3.6–5.1)
Alkaline phosphatase (APISO): 71 U/L (ref 36–130)
BUN: 15 mg/dL (ref 7–25)
CO2: 29 mmol/L (ref 20–32)
Calcium: 9.4 mg/dL (ref 8.6–10.3)
Chloride: 100 mmol/L (ref 98–110)
Creat: 0.96 mg/dL (ref 0.60–1.35)
GFR, Est African American: 115 mL/min/{1.73_m2} (ref >=60)
GFR, Est Non African American: 99 mL/min/{1.73_m2} (ref >=60)
Globulin: 4.2 g/dL (calc) — ABNORMAL HIGH (ref 1.9–3.7)
Glucose, Bld: 80 mg/dL (ref 65–99)
Potassium: 4.3 mmol/L (ref 3.5–5.3)
Sodium: 136 mmol/L (ref 135–146)
Total Bilirubin: 0.3 mg/dL (ref 0.2–1.2)
Total Protein: 8.3 g/dL — ABNORMAL HIGH (ref 6.1–8.1)

## 2020-08-02 LAB — RPR TITER: RPR Titer: 1:64 {titer} — ABNORMAL HIGH

## 2020-08-02 LAB — HIV-1 RNA QUANT-NO REFLEX-BLD
HIV 1 RNA Quant: 55200 Copies/mL — ABNORMAL HIGH
HIV-1 RNA Quant, Log: 4.74 Log cps/mL — ABNORMAL HIGH

## 2020-08-02 LAB — CBC WITH DIFFERENTIAL/PLATELET
Absolute Monocytes: 390 cells/uL (ref 200–950)
Basophils Absolute: 8 cells/uL (ref 0–200)
Basophils Relative: 0.2 %
Eosinophils Absolute: 49 cells/uL (ref 15–500)
Eosinophils Relative: 1.2 %
HCT: 45.5 % (ref 38.5–50.0)
Hemoglobin: 14.8 g/dL (ref 13.2–17.1)
Lymphs Abs: 1423 cells/uL (ref 850–3900)
MCH: 27.8 pg (ref 27.0–33.0)
MCHC: 32.5 g/dL (ref 32.0–36.0)
MCV: 85.5 fL (ref 80.0–100.0)
MPV: 11.9 fL (ref 7.5–12.5)
Monocytes Relative: 9.5 %
Neutro Abs: 2230 cells/uL (ref 1500–7800)
Neutrophils Relative %: 54.4 %
Platelets: 233 10*3/uL (ref 140–400)
RBC: 5.32 10*6/uL (ref 4.20–5.80)
RDW: 14 % (ref 11.0–15.0)
Total Lymphocyte: 34.7 %
WBC: 4.1 10*3/uL (ref 3.8–10.8)

## 2020-08-02 LAB — LIPID PANEL
Cholesterol: 142 mg/dL (ref <200)
HDL: 52 mg/dL (ref >=40)
LDL Cholesterol (Calc): 73 mg/dL (calc)
Non-HDL Cholesterol (Calc): 90 mg/dL (calc) (ref <130)
Total CHOL/HDL Ratio: 2.7 (calc) (ref <5.0)
Triglycerides: 88 mg/dL (ref <150)

## 2020-08-02 LAB — FLUORESCENT TREPONEMAL AB(FTA)-IGG-BLD: Fluorescent Treponemal ABS: REACTIVE — AB

## 2020-08-02 LAB — RPR: RPR Ser Ql: REACTIVE — AB

## 2020-08-07 DIAGNOSIS — A530 Latent syphilis, unspecified as early or late: Secondary | ICD-10-CM | POA: Diagnosis not present

## 2020-08-08 ENCOUNTER — Telehealth: Payer: Self-pay

## 2020-08-08 ENCOUNTER — Ambulatory Visit: Payer: BC Managed Care – PPO

## 2020-08-08 NOTE — Telephone Encounter (Signed)
I attempted to contact the patient in regards to positive RPR and to let him know he will need treatment for syphilis. Patient did not answer and a voicemail was left for him to call back. Tessia Kassin T Pricilla Loveless

## 2020-08-08 NOTE — Telephone Encounter (Signed)
Patient returned call and advised of positive syphilis results.  Patient scheduled for appointments for 2.4 MU of bicillin x3 . Patient denied any allergies. Patient advised to abstain from sex for duration of treatment and 10 days after last dose of bicillin.  Patient verbalized understanding Patient stated he was seen by urgent care yesterday and prescribed doxycycline, but he was unsure of the dosage and stated urgent care advised him to take the medication until he was able to come here for treatment. Celestial Barnfield T Pricilla Loveless

## 2020-08-08 NOTE — Telephone Encounter (Signed)
-----   Message from Veryl Speak, FNP sent at 08/08/2020  2:17 PM EDT ----- Please inform Daniel Dunlap that is appears he has Syphilis that needs to be treated. He will need to be scheduled for 2.4 million units of Bicillin weekly x 3 as this appears to be late latent syphilis

## 2020-08-09 ENCOUNTER — Other Ambulatory Visit: Payer: Self-pay

## 2020-08-09 ENCOUNTER — Ambulatory Visit (INDEPENDENT_AMBULATORY_CARE_PROVIDER_SITE_OTHER): Payer: BC Managed Care – PPO

## 2020-08-09 DIAGNOSIS — A528 Late syphilis, latent: Secondary | ICD-10-CM

## 2020-08-09 MED ORDER — PENICILLIN G BENZATHINE 1200000 UNIT/2ML IM SUSP
1.2000 10*6.[IU] | Freq: Once | INTRAMUSCULAR | Status: AC
  Administered 2020-08-09: 1.2 10*6.[IU] via INTRAMUSCULAR

## 2020-08-09 NOTE — Progress Notes (Signed)
Reviewed and verified allergies with patient. Patient states he has been treated with penicillin before. Patient tolerated Bicillin injections well. Reinforced abstinence until treatment completed plus an additional 10 days, offered condoms and encouraged use. Advised patient to notify sexual partners for testing and treatment. Patient verbalized understanding.   Sandie Ano, RN

## 2020-08-14 ENCOUNTER — Other Ambulatory Visit: Payer: Self-pay

## 2020-08-14 ENCOUNTER — Encounter: Payer: Self-pay | Admitting: Family

## 2020-08-14 ENCOUNTER — Ambulatory Visit (INDEPENDENT_AMBULATORY_CARE_PROVIDER_SITE_OTHER): Payer: BC Managed Care – PPO | Admitting: Family

## 2020-08-14 VITALS — BP 121/81 | HR 65 | Temp 98.5°F | Wt 220.0 lb

## 2020-08-14 DIAGNOSIS — A528 Late syphilis, latent: Secondary | ICD-10-CM

## 2020-08-14 DIAGNOSIS — Z0000 Encounter for general adult medical examination without abnormal findings: Secondary | ICD-10-CM

## 2020-08-14 DIAGNOSIS — B20 Human immunodeficiency virus [HIV] disease: Secondary | ICD-10-CM | POA: Diagnosis not present

## 2020-08-14 MED ORDER — PENICILLIN G BENZATHINE 1200000 UNIT/2ML IM SUSP
1.2000 10*6.[IU] | Freq: Once | INTRAMUSCULAR | Status: AC
  Administered 2020-08-14: 1.2 10*6.[IU] via INTRAMUSCULAR

## 2020-08-14 MED ORDER — SYMTUZA 800-150-200-10 MG PO TABS: 1.0000 | ORAL_TABLET | Freq: Every day | ORAL | 2 refills | Status: DC

## 2020-08-14 MED ORDER — TIVICAY 50 MG PO TABS: 50.0000 mg | ORAL_TABLET | Freq: Every day | ORAL | 2 refills | Status: DC

## 2020-08-14 NOTE — Assessment & Plan Note (Signed)
   Discussed importance of of safe sexual practices to reduce the risk of STI.  Condoms declined.  Declines vaccines today.  Due for routine dental care.

## 2020-08-14 NOTE — Progress Notes (Signed)
Patient ID: Daniel Dunlap, male    DOB: 1980-05-30, 40 y.o.   MRN: [id number]   Subjective:    Chief Complaint  Patient presents with  . Follow-up    B20     HPI:  Daniel Dunlap is a 40 y.o. male with HIV disease last seen on 3/2 24/21 with improved adherence and good tolerance to his ART regimen of Symtuza and Tivicay.  Viral load at the time was 137 with CD4 count of 413.  Most recent blood work completed on 07/31/2020 with viral load of 55,200 and CD4 count of 214.  RPR increased from previous 1: 8-1: 64 indicating syphilis infection.  Treated with 2,400,000 units of Bicillin.  Here today for routine follow-up.  Daniel Dunlap has been taking his medications for the last couple weeks after being off his medications for several months.  He has concerns about weight gain while being on medications.  Overall feeling well today. Denies fevers, chills, night sweats, headaches, changes in vision, neck pain/stiffness, nausea, diarrhea, vomiting, lesions or rashes.  Daniel Dunlap has no problems obtaining medication from the pharmacy and remains covered through [facility].  Denies feelings of being down, depressed, or hopeless recently.  No recreational or illicit drug use, tobacco use, or alcohol function.  Declines condoms.  Due for routine dental care.  Currently latent syphilis with Bicillin.   No Known Allergies    Outpatient Medications Prior to Visit  Medication Sig Dispense Refill  . Multiple Vitamin (MULTIVITAMIN PO) Take by mouth.    . Darunavir-Cobicisctat-Emtricitabine-Tenofovir Alafenamide (SYMTUZA) 800-150-200-10 MG TABS Take 1 tablet by mouth daily with breakfast. Call office to reschedule missed appointment 30 tablet 0  . dolutegravir (TIVICAY) 50 MG tablet Take 1 tablet (50 mg total) by mouth daily. 30 tablet 0   No facility-administered medications prior to visit.     Past Medical History:  Diagnosis Date  . Genital warts   . HIV infection (HCC)   . HPV (human  papilloma virus) infection   . Thyroid disease      History reviewed. No pertinent surgical history.     Review of Systems  Constitutional: Negative for appetite change, chills, fatigue, fever and unexpected weight change.  Eyes: Negative for visual disturbance.  Respiratory: Negative for cough, chest tightness, shortness of breath and wheezing.   Cardiovascular: Negative for chest pain and leg swelling.  Gastrointestinal: Negative for abdominal pain, constipation, diarrhea, nausea and vomiting.  Genitourinary: Negative for dysuria, flank pain, frequency, genital sores, hematuria and urgency.  Skin: Negative for rash.  Allergic/Immunologic: Negative for immunocompromised state.  Neurological: Negative for dizziness and headaches.      Objective:    BP 121/81   Pulse 65   Temp 98.5 F (36.9 C) (Oral)   Wt 220 lb (99.8 kg)   BMI 31.57 kg/m  Nursing note and vital signs reviewed.  Physical Exam Constitutional:      General: He is not in acute distress.    Appearance: He is well-developed.  Eyes:     Conjunctiva/sclera: Conjunctivae normal.  Cardiovascular:     Rate and Rhythm: Normal rate and regular rhythm.     Heart sounds: Normal heart sounds. No murmur heard. No friction rub. No gallop.   Pulmonary:     Effort: Pulmonary effort is normal. No respiratory distress.     Breath sounds: Normal breath sounds. No wheezing or rales.  Chest:     Chest wall: No tenderness.  Abdominal:  General: Bowel sounds are normal.     Palpations: Abdomen is soft.     Tenderness: There is no abdominal tenderness.  Musculoskeletal:     Cervical back: Neck supple.  Lymphadenopathy:     Cervical: No cervical adenopathy.  Skin:    General: Skin is warm and dry.     Findings: No rash.  Neurological:     Mental Status: He is alert and oriented to person, place, and time.  Psychiatric:        Behavior: Behavior normal.        Thought Content: Thought content normal.         Judgment: Judgment normal.      Depression screen [facility] 2/9 08/14/2020 05/24/2019 01/25/2019 08/16/2018 03/02/2018  Decreased Interest 0 0 0 0 0  Down, Depressed, Hopeless 0 0 0 0 0  PHQ - 2 Score 0 0 0 0 0       Assessment & Plan:    Patient Active Problem List   Diagnosis Date Noted  . Healthcare maintenance 05/24/2019  . Rash 10/05/2018  . Secondary syphilis 08/16/2018  . Pain, dental 08/16/2018  . Screening for STDs (sexually transmitted diseases) 04/26/2018  . STI (sexually transmitted infection) 04/26/2018  . Routine adult health maintenance 09/05/2016  . HIV disease (HCC) 07/12/2015  . Late latent syphilis 07/12/2015  . Chronic hepatitis B (HCC) 07/12/2015  . Normocytic anemia 07/12/2015  . Genital herpes 07/12/2015  . Hyperthyroidism 06/27/2015  . Abnormal liver function test 06/27/2015  . Pneumocystis jiroveci pneumonia (HCC) 06/27/2015     Problem List Items Addressed This Visit      Other   HIV disease [facility])    Daniel Dunlap has poorly controlled HIV disease secondary to less than optimal adherence with his ART regimen.  Fortunately he has no signs/symptoms of opportunistic infection or progressive HIV disease.  We discussed the importance of taking medications daily as prescribed to prevent complications and progression of disease in the future.  Understands importance of medication.  He is not a candidate for injectable medication due to his multiple levels of resistance.  Continue current dose of Symtuza and Tivicay.  Plan for follow-up in 1 month or sooner if needed with lab work on the same day.      Relevant Medications   Darunavir-Cobicisctat-Emtricitabine-Tenofovir Alafenamide (SYMTUZA) 800-150-200-10 MG TABS   dolutegravir (TIVICAY) 50 MG tablet   Late latent syphilis    Daniel Dunlap has latent syphilis and received his first dose of 2,400,000 units of penicillin last week.  Will receive second dose of 2,400,000 units today in 1 week from today to complete treatment.   No current symptoms.  Discussed importance of safe sexual practice with recommended use of condoms.  Recheck RPR following completion of treatment.      Relevant Medications   Darunavir-Cobicisctat-Emtricitabine-Tenofovir Alafenamide (SYMTUZA) 800-150-200-10 MG TABS   dolutegravir (TIVICAY) 50 MG tablet   Healthcare maintenance     Discussed importance of of safe sexual practices to reduce the risk of STI.  Condoms declined.  Declines vaccines today.  Due for routine dental care.          I have changed Daniel Dunlap's Symtuza. I am also having him maintain his Multiple Vitamin (MULTIVITAMIN PO) and Tivicay.   Meds ordered this encounter  Medications  . Darunavir-Cobicisctat-Emtricitabine-Tenofovir Alafenamide (SYMTUZA) 800-150-200-10 MG TABS    Sig: Take 1 tablet by mouth daily with breakfast.    Dispense:  30 tablet    Refill:  2  Order Specific Question:   Supervising Provider    Answer:   Judyann Munson [phone]  . dolutegravir (TIVICAY) 50 MG tablet    Sig: Take 1 tablet (50 mg total) by mouth daily.    Dispense:  30 tablet    Refill:  2    Order Specific Question:   Supervising Provider    Answer:   Judyann Munson [[id number]]     Follow-up: Return in about 1 month (around 09/13/2020), or if symptoms worsen or fail to improve.   Marcos Eke, MSN, FNP-C Nurse Practitioner [facility] Main number: [phone]

## 2020-08-14 NOTE — Patient Instructions (Addendum)
Nice to see you.  Continue to take your Symtuza and Tivicay as prescribed  Refills of been sent to the pharmacy  Recommend routine dental care  Schedule a third penicillin injection  Plan for follow-up in 1 month or sooner if needed with lab work on the same day.

## 2020-08-14 NOTE — Assessment & Plan Note (Signed)
Daniel Dunlap has latent syphilis and received his first dose of 2,400,000 units of penicillin last week.  Will receive second dose of 2,400,000 units today in 1 week from today to complete treatment.  No current symptoms.  Discussed importance of safe sexual practice with recommended use of condoms.  Recheck RPR following completion of treatment.

## 2020-08-14 NOTE — Assessment & Plan Note (Signed)
Daniel Dunlap has poorly controlled HIV disease secondary to less than optimal adherence with his ART regimen.  Fortunately he has no signs/symptoms of opportunistic infection or progressive HIV disease.  We discussed the importance of taking medications daily as prescribed to prevent complications and progression of disease in the future.  Understands importance of medication.  He is not a candidate for injectable medication due to his multiple levels of resistance.  Continue current dose of Symtuza and Tivicay.  Plan for follow-up in 1 month or sooner if needed with lab work on the same day.

## 2020-08-15 ENCOUNTER — Ambulatory Visit: Payer: BC Managed Care – PPO

## 2020-08-21 ENCOUNTER — Other Ambulatory Visit: Payer: Self-pay

## 2020-08-21 ENCOUNTER — Ambulatory Visit (INDEPENDENT_AMBULATORY_CARE_PROVIDER_SITE_OTHER): Payer: BC Managed Care – PPO

## 2020-08-21 DIAGNOSIS — A64 Unspecified sexually transmitted disease: Secondary | ICD-10-CM | POA: Diagnosis not present

## 2020-08-21 MED ORDER — PENICILLIN G BENZATHINE 1200000 UNIT/2ML IM SUSP
2.4000 10*6.[IU] | Freq: Once | INTRAMUSCULAR | Status: AC
  Administered 2020-08-21: 2.4 10*6.[IU] via INTRAMUSCULAR

## 2020-08-21 NOTE — Progress Notes (Signed)
Patient here for 3/3 bicillin injection today for syphilis treatment. NO concerns voiced. Condoms accepted. Patient tolerated injections well.  Chaperone present during medication administration. Valarie Cones

## 2020-08-22 ENCOUNTER — Ambulatory Visit: Payer: BC Managed Care – PPO

## 2020-09-11 ENCOUNTER — Ambulatory Visit (INDEPENDENT_AMBULATORY_CARE_PROVIDER_SITE_OTHER): Payer: BC Managed Care – PPO | Admitting: Family

## 2020-09-11 ENCOUNTER — Other Ambulatory Visit: Payer: Self-pay

## 2020-09-11 ENCOUNTER — Encounter: Payer: Self-pay | Admitting: Family

## 2020-09-11 VITALS — BP 119/76 | HR 68 | Temp 97.5°F | Ht 70.0 in | Wt 221.0 lb

## 2020-09-11 DIAGNOSIS — Z0000 Encounter for general adult medical examination without abnormal findings: Secondary | ICD-10-CM

## 2020-09-11 DIAGNOSIS — B20 Human immunodeficiency virus [HIV] disease: Secondary | ICD-10-CM

## 2020-09-11 MED ORDER — TIVICAY 50 MG PO TABS: 50.0000 mg | ORAL_TABLET | Freq: Every day | ORAL | 2 refills | Status: DC

## 2020-09-11 MED ORDER — SYMTUZA 800-150-200-10 MG PO TABS: 1.0000 | ORAL_TABLET | Freq: Every day | ORAL | 2 refills | Status: DC

## 2020-09-11 NOTE — Assessment & Plan Note (Signed)
Daniel Dunlap has improved adherence to his medications having missed 1 dose since last office visit. No signs/symptoms of opportunistic infection or progressive HIV. Discussed importance of continuing to take medications daily. Reviewed previous lab work. Check blood work today. Continue current dose of Tivicay and Symtuza. Plan for follow up in 6 weeks or sooner if needed.

## 2020-09-11 NOTE — Progress Notes (Signed)
Brief Narrative   Patient ID: Daniel Dunlap, male    DOB: Sep 10, 1980, 40 y.o.   MRN: [id number]    Subjective:    Chief Complaint  Patient presents with  . Follow-up     HPI:  Daniel Dunlap is a 40 y.o. male with HIV disease last seen on 08/15/19 with poorly controlled virus and less than optimal adherence to his ART regimen of Symtuza and Tivicay. Viral load was 55,200 with CD4 count of 214. Genotype with M184V and E138A . No integrase resistance was present. RPR was positive at 1:64 up from previous 1:8. Here today for follow up.  Daniel Dunlap has been taking his medications daily as prescribed and has missed 1 dose since his last office visit. No adverse side effects. Overall feeling well today with no new concerns/complaints. Denies fevers, chills, night sweats, headaches, changes in vision, neck pain/stiffness, nausea, diarrhea, vomiting, lesions or rashes.  Daniel Dunlap has no problems obtaining medication from the pharmacy. Denies feelings of being down, depressed or hopeless. No recreational or illicit drug use. Alcohol consumption is on occasion and smokes about 1-2 cigarettes per day. Condoms declined.    No Known Allergies    Outpatient Medications Prior to Visit  Medication Sig Dispense Refill  . Multiple Vitamin (MULTIVITAMIN PO) Take by mouth.    . Darunavir-Cobicisctat-Emtricitabine-Tenofovir Alafenamide (SYMTUZA) 800-150-200-10 MG TABS Take 1 tablet by mouth daily with breakfast. 30 tablet 2  . dolutegravir (TIVICAY) 50 MG tablet Take 1 tablet (50 mg total) by mouth daily. 30 tablet 2   No facility-administered medications prior to visit.     Past Medical History:  Diagnosis Date  . Genital warts   . HIV infection (HCC)   . HPV (human papilloma virus) infection   . Thyroid disease      History reviewed. No pertinent surgical history.     Review of Systems  Constitutional: Negative for appetite change, chills, fatigue, fever and unexpected weight change.   Eyes: Negative for visual disturbance.  Respiratory: Negative for cough, chest tightness, shortness of breath and wheezing.   Cardiovascular: Negative for chest pain and leg swelling.  Gastrointestinal: Negative for abdominal pain, constipation, diarrhea, nausea and vomiting.  Genitourinary: Negative for dysuria, flank pain, frequency, genital sores, hematuria and urgency.  Skin: Negative for rash.  Allergic/Immunologic: Negative for immunocompromised state.  Neurological: Negative for dizziness and headaches.      Objective:    BP 119/76   Pulse 68   Temp (!) 97.5 F (36.4 C) (Oral)   Ht 5\' 10"  (1.778 m)   Wt 221 lb (100.2 kg)   SpO2 99%   BMI 31.71 kg/m  Nursing note and vital signs reviewed.  Physical Exam Constitutional:      General: He is not in acute distress.    Appearance: He is well-developed.  Eyes:     Conjunctiva/sclera: Conjunctivae normal.  Cardiovascular:     Rate and Rhythm: Normal rate and regular rhythm.     Heart sounds: Normal heart sounds. No murmur heard. No friction rub. No gallop.   Pulmonary:     Effort: Pulmonary effort is normal. No respiratory distress.     Breath sounds: Normal breath sounds. No wheezing or rales.  Chest:     Chest wall: No tenderness.  Abdominal:     General: Bowel sounds are normal.     Palpations: Abdomen is soft.     Tenderness: There is no abdominal tenderness.  Musculoskeletal:     Cervical  back: Neck supple.  Lymphadenopathy:     Cervical: No cervical adenopathy.  Skin:    General: Skin is warm and dry.     Findings: No rash.  Neurological:     Mental Status: He is alert and oriented to person, place, and time.  Psychiatric:        Behavior: Behavior normal.        Thought Content: Thought content normal.        Judgment: Judgment normal.      Depression screen [facility] 2/9 09/11/2020 08/14/2020 05/24/2019 01/25/2019 08/16/2018  Decreased Interest 0 0 0 0 0  Down, Depressed, Hopeless 0 0 0 0 0  PHQ - 2 Score 0  0 0 0 0       Assessment & Plan:    Patient Active Problem List   Diagnosis Date Noted  . Healthcare maintenance 05/24/2019  . Rash 10/05/2018  . Secondary syphilis 08/16/2018  . Pain, dental 08/16/2018  . Screening for STDs (sexually transmitted diseases) 04/26/2018  . STI (sexually transmitted infection) 04/26/2018  . Routine adult health maintenance 09/05/2016  . HIV disease (HCC) 07/12/2015  . Late latent syphilis 07/12/2015  . Chronic hepatitis B (HCC) 07/12/2015  . Normocytic anemia 07/12/2015  . Genital herpes 07/12/2015  . Hyperthyroidism 06/27/2015  . Abnormal liver function test 06/27/2015  . Pneumocystis jiroveci pneumonia (HCC) 06/27/2015     Problem List Items Addressed This Visit      Other   HIV disease (HCC) - Primary    Daniel Dunlap has improved adherence to his medications having missed 1 dose since last office visit. No signs/symptoms of opportunistic infection or progressive HIV. Discussed importance of continuing to take medications daily. Reviewed previous lab work. Check blood work today. Continue current dose of Tivicay and Symtuza. Plan for follow up in 6 weeks or sooner if needed.       Relevant Medications   Darunavir-Cobicisctat-Emtricitabine-Tenofovir Alafenamide (SYMTUZA) 800-150-200-10 MG TABS   dolutegravir (TIVICAY) 50 MG tablet   Other Relevant Orders   HIV-1 RNA quant-no reflex-bld   T-helper cell (CD4)- ([facility] clinic only)   Routine adult health maintenance     Discussed importance of safe sexual practice to reduce risk of STI. Condoms declined.           I am having Rodriguez Sorto maintain his Multiple Vitamin (MULTIVITAMIN PO), Symtuza, and Tivicay.   Meds ordered this encounter  Medications  . Darunavir-Cobicisctat-Emtricitabine-Tenofovir Alafenamide (SYMTUZA) 800-150-200-10 MG TABS    Sig: Take 1 tablet by mouth daily with breakfast.    Dispense:  30 tablet    Refill:  2    Order Specific Question:   Supervising Provider     Answer:   Judyann Munson [[id number]]  . dolutegravir (TIVICAY) 50 MG tablet    Sig: Take 1 tablet (50 mg total) by mouth daily.    Dispense:  30 tablet    Refill:  2    Order Specific Question:   Supervising Provider    Answer:   Judyann Munson [[id number]]     Follow-up: Return in about 6 weeks (around 10/23/2020), or if symptoms worsen or fail to improve.   Marcos Eke, MSN, FNP-C Nurse Practitioner [facility] Main number: [phone]

## 2020-09-11 NOTE — Patient Instructions (Signed)
Nice to see you.  We will check your lab work today.  Continue to take you medications daily as prescribed.   Refills have been sent to the pharmacy.  Plan for follow up 6 weeks or sooner if needed.   Have a great day and stay safe!

## 2020-09-11 NOTE — Assessment & Plan Note (Signed)
   Discussed importance of safe sexual practice to reduce risk of STI.  Condoms declined. 

## 2020-09-12 LAB — T-HELPER CELL (CD4) - (RCID CLINIC ONLY)
CD4 % Helper T Cell: 18 % — ABNORMAL LOW (ref 33–65)
CD4 T Cell Abs: 260 /uL — ABNORMAL LOW (ref 400–1790)

## 2020-09-13 LAB — HIV-1 RNA QUANT-NO REFLEX-BLD
HIV 1 RNA Quant: 46 Copies/mL — ABNORMAL HIGH
HIV-1 RNA Quant, Log: 1.66 Log cps/mL — ABNORMAL HIGH

## 2020-10-01 IMAGING — DX RIGHT FOOT COMPLETE - 3+ VIEW
3 series · 3 of 3 positions shown · non-contrast
Comparison: None.

CLINICAL DATA: Right mid foot pain on the plantar surface.

EXAM:
RIGHT FOOT COMPLETE - 3+ VIEW

[foot ap]
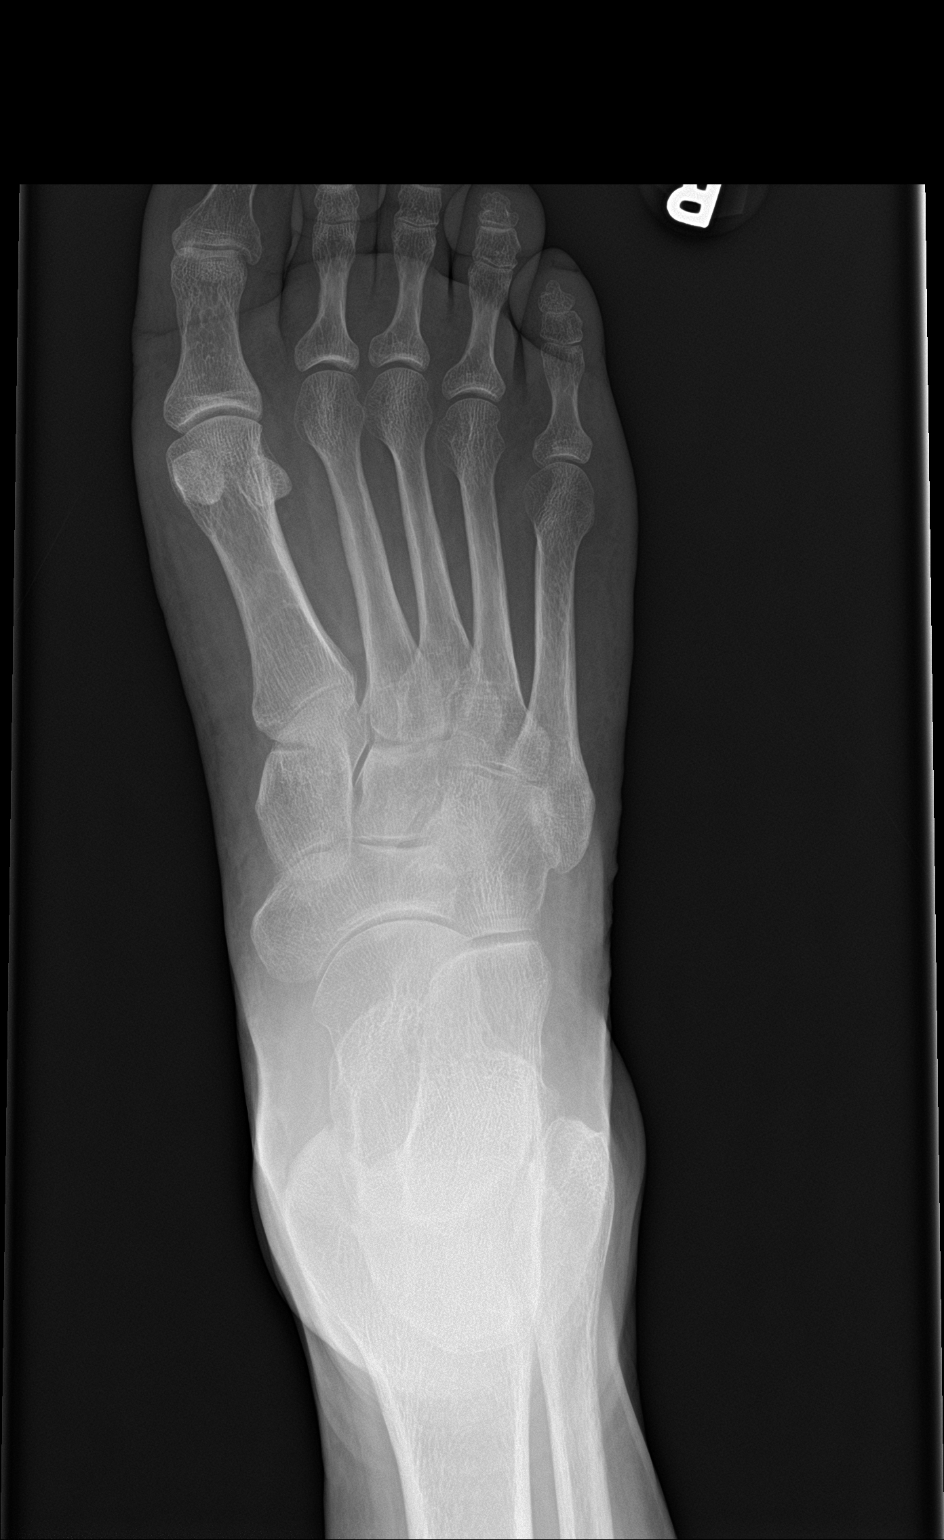

[foot obl]
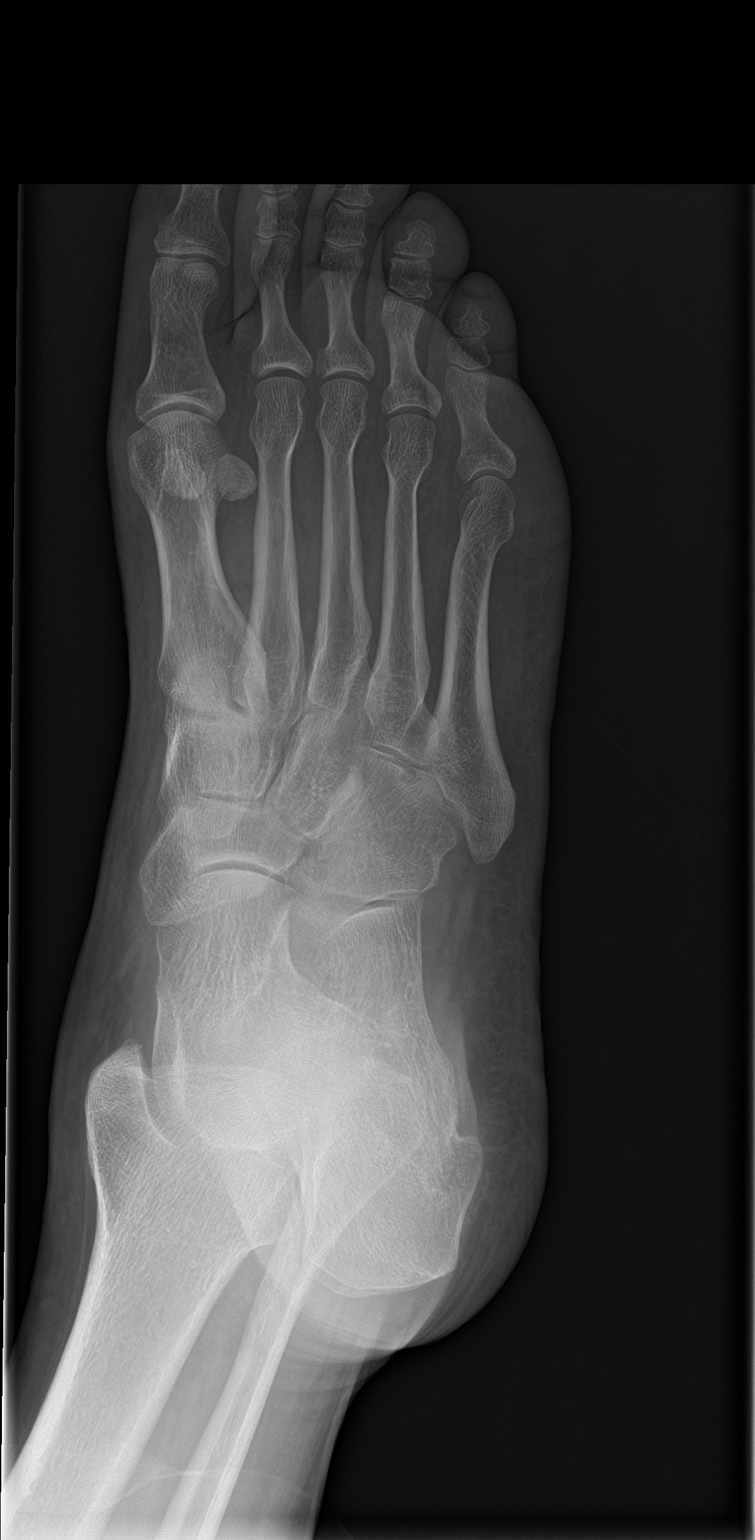

[foot lat]
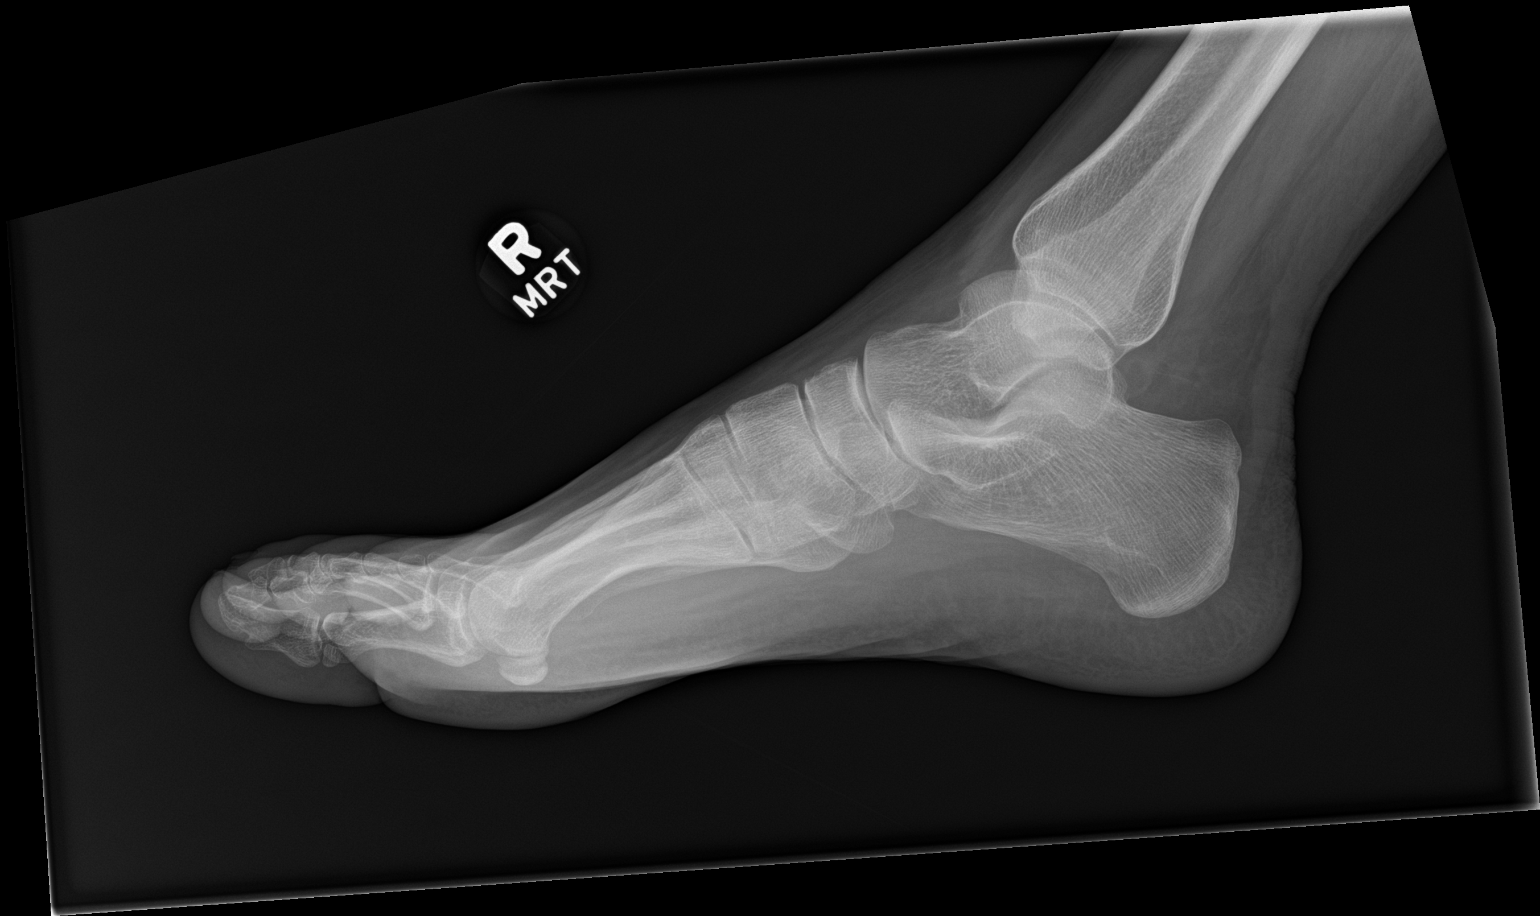

[3 of 3 positions shown; findings below may reference images not displayed]

FINDINGS: Negative for fracture or dislocation. Normal alignment. No focal
soft tissue abnormality.
IMPRESSION: No acute abnormality to the right foot.

## 2020-11-06 ENCOUNTER — Ambulatory Visit: Payer: BC Managed Care – PPO | Admitting: Family

## 2020-12-12 ENCOUNTER — Telehealth: Payer: Self-pay

## 2020-12-12 NOTE — Telephone Encounter (Signed)
Received fax from patient's pharmacy stating they have not been able to reach him to refill his medication. Left voicemail requesting patient call pharmacy today. Juanita Laster

## 2020-12-12 NOTE — Telephone Encounter (Signed)
Called patient to get an appointment scheduled, left voicemail for patient to call us back to get scheduled. 

## 2020-12-13 ENCOUNTER — Other Ambulatory Visit (HOSPITAL_COMMUNITY)
Admission: RE | Admit: 2020-12-13 | Discharge: 2020-12-13 | Disposition: A | Discharge status: Home / Self Care | Payer: BC Managed Care – PPO | Source: Ambulatory Visit | Attending: Physician Assistant | Admitting: Physician Assistant

## 2020-12-13 ENCOUNTER — Encounter: Payer: Self-pay | Admitting: Physician Assistant

## 2020-12-13 ENCOUNTER — Ambulatory Visit (INDEPENDENT_AMBULATORY_CARE_PROVIDER_SITE_OTHER): Payer: BC Managed Care – PPO | Admitting: Physician Assistant

## 2020-12-13 ENCOUNTER — Other Ambulatory Visit: Payer: Self-pay

## 2020-12-13 VITALS — BP 138/98 | HR 98 | Temp 98.2°F | Ht 70.0 in | Wt 215.0 lb

## 2020-12-13 DIAGNOSIS — B04 Monkeypox: Secondary | ICD-10-CM

## 2020-12-13 DIAGNOSIS — Z113 Encounter for screening for infections with a predominantly sexual mode of transmission: Secondary | ICD-10-CM | POA: Diagnosis present

## 2020-12-13 DIAGNOSIS — R21 Rash and other nonspecific skin eruption: Secondary | ICD-10-CM | POA: Diagnosis not present

## 2020-12-13 DIAGNOSIS — B20 Human immunodeficiency virus [HIV] disease: Secondary | ICD-10-CM | POA: Diagnosis not present

## 2020-12-13 MED ORDER — TPOXX 200 MG PO CAPS: 600.0000 mg | ORAL_CAPSULE | Freq: Two times a day (BID) | ORAL | 0 refills | Status: AC

## 2020-12-13 NOTE — Progress Notes (Signed)
Subjective:    Patient ID: Daniel Dunlap, male    DOB: August 13, 1980, 40 y.o.   MRN: [id number]  Chief Complaint  Patient presents with   Rash    Concern for HMPX      HPI:  Daniel Dunlap is a 40 y.o. male with HIV and has had spotty adherence to ART regimen Symtuza and Tivicay.  Barrier is pill burden.  He has not taken ART in over a week. Last VL 09/11/20 was improved at 46 copies and CD4 267. He tolerates medication well when he takes it. He presents today to discuss new onset of skin rash.  Observed single lesion on left hand 3 days ago 10 Dec 2020 and found that the similar lesions moved to right aspect of his forehead and one on palm on left hand. Several new lesions today on trunk, back, and arms. He is worried rash is due to syphilis or monkeypox. He denies rectal lesions, penile lesions, oral lesions. He describes the lesions as red and raised, with central umbilication.  Mildly tender to touch and some pruritis.  The initial lesion has become larger over the 3 days since onset.   He has no pets, no recent travel, no known contacts with similar lesions or HMPX. He has recently been sexually active with 2 different partners, one being anonymous on 28 Nov 2020. Same partner on 04 Dec 2020 and 10 Dec 2020 when lesions appeared. All were condomless receptive anal sex. He does report possible fever on 07 Dec 2020 while at work he felt chills and "hot flashes" that was remedied with Advil.  This last for one day and he did not take his temperature at home. He denies rectal pain, diarrhea, urinary symptoms, blood in stool, bodyaches, sore throat, cough, congestion, lymphadenopathy.   Recent STI treatment of late latent syphilis with 2.4 mil units bicillin once x 3 weeks 4/14,4/19,08/21/2020.       No Known Allergies    Outpatient Medications Prior to Visit  Medication Sig Dispense Refill   Darunavir-Cobicisctat-Emtricitabine-Tenofovir Alafenamide (SYMTUZA) 800-150-200-10 MG TABS  Take 1 tablet by mouth daily with breakfast. 30 tablet 2   dolutegravir (TIVICAY) 50 MG tablet Take 1 tablet (50 mg total) by mouth daily. 30 tablet 2   Multiple Vitamin (MULTIVITAMIN PO) Take by mouth.     No facility-administered medications prior to visit.     Past Medical History:  Diagnosis Date   Genital warts    HIV infection (HCC)    HPV (human papilloma virus) infection    Thyroid disease      No past surgical history on file.     Review of Systems  Constitutional:  Negative for activity change, appetite change, chills, fatigue and fever.  HENT:  Negative for congestion, mouth sores, rhinorrhea, sinus pressure, sore throat and trouble swallowing.   Respiratory: Negative.    Cardiovascular:  Negative for chest pain.  Gastrointestinal:  Negative for abdominal pain, anal bleeding, blood in stool, constipation, diarrhea, nausea, rectal pain and vomiting.  Genitourinary:  Negative for decreased urine volume, difficulty urinating, dysuria, flank pain, frequency, genital sores, hematuria, penile discharge, penile pain, penile swelling, scrotal swelling, testicular pain and urgency.  Musculoskeletal:  Negative for arthralgias, back pain, joint swelling and myalgias.  Skin:  Positive for rash.  Neurological: Negative.   Hematological: Negative.  Negative for adenopathy.  Psychiatric/Behavioral: Negative.       Objective:    BP (!) 138/98   Pulse 98   Temp 98.2  F (36.8 C)   Ht 5\' 10"  (1.778 m)   Wt 215 lb (97.5 kg)   BMI 30.85 kg/m  Nursing note and vital signs reviewed.  Physical Exam Vitals and nursing note reviewed.  Constitutional:      General: He is not in acute distress.    Appearance: Normal appearance. He is normal weight. He is not ill-appearing.  HENT:     Head: Normocephalic and atraumatic.  Eyes:     Extraocular Movements: Extraocular movements intact.     Pupils: Pupils are equal, round, and reactive to light.  Cardiovascular:     Rate and  Rhythm: Normal rate and regular rhythm.  Pulmonary:     Effort: Pulmonary effort is normal.     Breath sounds: Normal breath sounds.  Abdominal:     General: Abdomen is flat.     Palpations: Abdomen is soft.  Musculoskeletal:        General: Normal range of motion.     Cervical back: Normal range of motion and neck supple.  Lymphadenopathy:     Cervical: No cervical adenopathy.  Skin:    General: Skin is warm and dry.     Findings: Lesion and rash present. Rash is papular, pustular and vesicular.     Comments: See photos attached. Lesions well demarcated measuring 4-9 mm in diameter raised papules, vesicles, and papules with central umbilication. Mildly tender to palpate, local edema and erythema at margins, no d/c or exudate present. No signs of secondary infection.  Neurological:     General: No focal deficit present.     Mental Status: He is alert and oriented to person, place, and time.  Psychiatric:        Mood and Affect: Mood normal.        Behavior: Behavior normal.        Thought Content: Thought content normal.        Judgment: Judgment normal.              Depression screen [facility] 2/9 12/13/2020 09/11/2020 08/14/2020 05/24/2019 01/25/2019  Decreased Interest 0 0 0 0 0  Down, Depressed, Hopeless 0 0 0 0 0  PHQ - 2 Score 0 0 0 0 0       Assessment & Plan:  Presumptive diagnosis of HMPX due to clinical consistency -Obtained informed Consent with EA-IND CDC consent, all questions answered prior to signing-was given 20 minutes to review prior to signing. He was eligible to receive TPOXX treatment 600 mg bid x 14 days. Provided optional diary and submitted Uptake Form to pharmacy. Advised to take medication 30 minutes after glass of water and high fat caloric meal twice daily (3 tablets) He is to noitfy me if he has new lesions once treatment initiated and if he experiences any Aes. -Educated on modes of transmission, prevention, management, treatment, isolation (work noted  provided) -He is to avoid close contact and not engage in sexual activity. Advised him to notify recent partner and they may receive PEP at our clinic with vaccine.  -MPX swabs will not return for approximately 72 hours if they are negative we will discontinue medication  STI screening-gonorrhea and chlamydia-urine, rectal, throat swabs obtained and additional RPR w titer -Offered condoms and lubricant and advised to avoid all sexual contact until results return  HIV-not currently adherent to ART regimen, explained increased risk of severe outcomes of opportunistic infections if he does not stay adherent. He will discuss and follow up with 01/27/2019 at next office  visit.   Patient Active Problem List   Diagnosis Date Noted   Healthcare maintenance 05/24/2019   Rash 10/05/2018   Secondary syphilis 08/16/2018   Pain, dental 08/16/2018   Screening for STDs (sexually transmitted diseases) 04/26/2018   STI (sexually transmitted infection) 04/26/2018   Routine adult health maintenance 09/05/2016   HIV disease (HCC) 07/12/2015   Late latent syphilis 07/12/2015   Chronic hepatitis B (HCC) 07/12/2015   Normocytic anemia 07/12/2015   Genital herpes 07/12/2015   Hyperthyroidism 06/27/2015   Abnormal liver function test 06/27/2015   Pneumocystis jiroveci pneumonia (HCC) 06/27/2015     Problem List Items Addressed This Visit       Other   HIV disease (HCC) - Primary   Relevant Medications   tecovirimat (TPOXX) capsule   Other Visit Diagnoses     Rash and nonspecific skin eruption       Relevant Orders   Monkeypox Virus DNA, Qualitative Real-Time PCR   Monkeypox Virus DNA, Qualitative Real-Time PCR   Monkeypox Virus DNA, Qualitative Real-Time PCR   Venereal disease screening       Relevant Orders   Cytology (oral, anal, urethral) ancillary only   RPR   Urine cytology ancillary only   Cytology (oral, anal, urethral) ancillary only   Human monkeypox       Relevant Medications    tecovirimat (TPOXX) capsule        I am having Javid Metheny start on Tpoxx. I am also having him maintain his Multiple Vitamin (MULTIVITAMIN PO), Symtuza, and Tivicay.   Meds ordered this encounter  Medications   tecovirimat (TPOXX) capsule    Sig: Take 3 capsules (600 mg total) by mouth 2 (two) times daily with a meal for 14 days.    Dispense:  84 capsule    Refill:  0    Order Specific Question:   Supervising Provider    Answer:   VAN DAM, CORNELIUS N [[id number]]     Follow-up: Return in about 1 week (around 12/20/2020) for MPX.

## 2020-12-13 NOTE — Patient Instructions (Addendum)
Nice meeting you Trice! Your rash clinically appears to be Monkeypox, the swabs will verify and return in 3 days. Gonorrhea and chlamydia as well as syphilis result will return tomorrow. Note for work to return in 3 weeks however if rash resolves earlier I will adjust return date Informed consent provided and copy provided regarding TPOXX treatment Tecoviramat 600 mg twice daily x 14 days with water and fatty meal 30 minutes before medication It is imperative to remain adherent to Symtuza and Tivicay as directed to suppress your HIV, if not your symptoms could worsen You must isolate at home and keep rash covered at all times, discussed disinfecting and linens needing to be sterilized after symptoms resolved You will no longer be contagious when scabbing over of lesions and new skin present You will need to avoid all close contact wear a face mask or respirator and keep lesions covered Modes of transmission discussed Notify partner to have post exposure prophylaxis of vaccine at health department or our clinic must schedule appt Check my chart for your appt in 1 week

## 2020-12-17 LAB — URINE CYTOLOGY ANCILLARY ONLY
Chlamydia: NEGATIVE
Comment: NEGATIVE
Comment: NORMAL
Neisseria Gonorrhea: NEGATIVE

## 2020-12-17 LAB — CYTOLOGY, (ORAL, ANAL, URETHRAL) ANCILLARY ONLY
Chlamydia: NEGATIVE
Chlamydia: NEGATIVE
Comment: NEGATIVE
Comment: NEGATIVE
Comment: NORMAL
Comment: NORMAL
Neisseria Gonorrhea: NEGATIVE
Neisseria Gonorrhea: NEGATIVE

## 2020-12-17 LAB — RPR TITER: RPR Titer: 1:16 {titer} — ABNORMAL HIGH

## 2020-12-17 LAB — RPR: RPR Ser Ql: REACTIVE — AB

## 2020-12-17 LAB — FLUORESCENT TREPONEMAL AB(FTA)-IGG-BLD: Fluorescent Treponemal ABS: REACTIVE — AB

## 2020-12-18 ENCOUNTER — Telehealth: Payer: Self-pay

## 2020-12-18 LAB — MONKEYPOX VIRUS DNA, QUALITATIVE REAL-TIME PCR
MONKEYPOX VIRUS DNA, QL PCR: DETECTED — AB
MONKEYPOX VIRUS DNA, QL PCR: DETECTED — AB
MONKEYPOX VIRUS DNA, QL PCR: DETECTED — AB
Orthopoxvirus DNA, QL PCR: DETECTED — AB
Orthopoxvirus DNA, QL PCR: DETECTED — AB
Orthopoxvirus DNA, QL PCR: DETECTED — AB

## 2020-12-18 NOTE — Telephone Encounter (Signed)
Received call from [facility] with urgent lab results  Call received at 0930am Monkeypox Virus /Orthopoxvirus Detected. Reported to PA at 0945am she already aware and notified patient via MyChart to continue treatment with TPOXX as directed and follow up on 12/20/20.  Reported to [facility] via fax at [phone]  [facility]

## 2020-12-20 ENCOUNTER — Ambulatory Visit (INDEPENDENT_AMBULATORY_CARE_PROVIDER_SITE_OTHER): Payer: BC Managed Care – PPO | Admitting: Physician Assistant

## 2020-12-20 ENCOUNTER — Other Ambulatory Visit: Payer: Self-pay

## 2020-12-20 ENCOUNTER — Encounter: Payer: Self-pay | Admitting: Physician Assistant

## 2020-12-20 VITALS — BP 123/84 | HR 68 | Temp 97.8°F | Resp 18 | Wt 210.0 lb

## 2020-12-20 DIAGNOSIS — B04 Monkeypox: Secondary | ICD-10-CM

## 2020-12-20 NOTE — Progress Notes (Signed)
Subjective:    Patient ID: Daniel Dunlap, male    DOB: [birth year], 40 y.o.   MRN: [id number]  Chief Complaint  Patient presents with   Follow-up    HMPX interim follow up Day 8 treatment      HPI:  Daniel Dunlap is a 40 y.o. male [facility] presenting today for follow up regarding Human Monkeypox virus.  Symptom onset 12/10/20, began TPOXX treatment 12/13/2020-possible exposure 11/28/2020.  He denies any new lesions since treatment onset, lesions totaling around 14-16.  All lesions have scabbed over, scab fallen off in some locations and a few have new skin growth. He has been adherent to both his ART regimen and TPOXX, tolerating both well with no adverse advents or SAEs to report.  He has remained isolated at home and work has been supportive of his time off. He denies any current symptoms.      No Known Allergies    Outpatient Medications Prior to Visit  Medication Sig Dispense Refill   Darunavir-Cobicisctat-Emtricitabine-Tenofovir Alafenamide (SYMTUZA) 800-150-200-10 MG TABS Take 1 tablet by mouth daily with breakfast. 30 tablet 2   dolutegravir (TIVICAY) 50 MG tablet Take 1 tablet (50 mg total) by mouth daily. 30 tablet 2   Multiple Vitamin (MULTIVITAMIN PO) Take by mouth.     tecovirimat (TPOXX) capsule Take 3 capsules (600 mg total) by mouth 2 (two) times daily with a meal for 14 days. 84 capsule 0   No facility-administered medications prior to visit.     Past Medical History:  Diagnosis Date   Genital warts    HIV infection (HCC)    HPV (human papilloma virus) infection    Thyroid disease      History reviewed. No pertinent surgical history.     Review of Systems  Constitutional:  Negative for activity change, appetite change, chills, fatigue and fever.  HENT:  Negative for congestion, mouth sores, rhinorrhea and sore throat.   Respiratory:  Negative for cough, shortness of breath and wheezing.   Cardiovascular:  Negative for chest pain, palpitations and leg  swelling.  Gastrointestinal:  Negative for anal bleeding, blood in stool, diarrhea, nausea and rectal pain.  Genitourinary:  Negative for dysuria, flank pain, frequency, genital sores, hematuria, penile discharge, penile pain, penile swelling, testicular pain and urgency.  Musculoskeletal:  Negative for arthralgias and myalgias.  Skin:  Positive for rash (healing lesions at all sites).  Neurological:  Negative for weakness and light-headedness.  Hematological:  Negative for adenopathy.  Psychiatric/Behavioral: Negative.       Objective:    BP 123/84   Pulse 68   Temp 97.8 F (36.6 C)   Resp 18   Wt 210 lb (95.3 kg)   BMI 30.13 kg/m  Nursing note and vital signs reviewed.  Physical Exam Vitals reviewed.  Constitutional:      General: He is not in acute distress.    Appearance: Normal appearance. He is not ill-appearing.  HENT:     Head: Normocephalic and atraumatic.  Cardiovascular:     Rate and Rhythm: Normal rate and regular rhythm.  Pulmonary:     Effort: Pulmonary effort is normal.     Breath sounds: Normal breath sounds.  Musculoskeletal:     Cervical back: Normal range of motion.  Lymphadenopathy:     Cervical: No cervical adenopathy.  Skin:    Findings: Lesion present.     Comments: Lesions along chest, abdomen, back, forehead, hand, and upper extremities showdesquamation and several have new skin growth without  hypopigmentation.  See photos. No signs of secondary infections d/c or exudate at any sites.  Neurological:     General: No focal deficit present.     Mental Status: He is alert and oriented to person, place, and time.  Psychiatric:        Mood and Affect: Mood normal.        Behavior: Behavior normal.        Thought Content: Thought content normal.        Judgment: Judgment normal.            Depression screen [facility] 2/9 12/13/2020 09/11/2020 08/14/2020 05/24/2019 01/25/2019  Decreased Interest 0 0 0 0 0  Down, Depressed, Hopeless 0 0 0 0 0  PHQ - 2  Score 0 0 0 0 0       Assessment & Plan:  HMPX-clinical outcome form completed, pt is maintaining diary at home.  He has been adherent and tolerating TPOXX and taking as directed.  All lesions appear to be in final phase of desquamation or new skin growth has occurred with minimal scarring present.  No signs of secondary infection.  He has been followed by Health Department, all close contacts were notified and received PEP [facility] at [facility]. Work note provided to remain in isolation- again reiterated to keep lesions covered and wear tight fitting face mask if decides to go in public. He will follow up with me again in 1 week and I expect to release him with full recovery from orthopoxvirus.  Discussed safe sex practices and natural immunity-unclear on how long that will last and vaccination,if ever needed.    Patient Active Problem List   Diagnosis Date Noted   Healthcare maintenance 05/24/2019   Rash 10/05/2018   Secondary syphilis 08/16/2018   Pain, dental 08/16/2018   Screening for STDs (sexually transmitted diseases) 04/26/2018   STI (sexually transmitted infection) 04/26/2018   Routine adult health maintenance 09/05/2016   HIV disease (HCC) 07/12/2015   Late latent syphilis 07/12/2015   Chronic hepatitis B (HCC) 07/12/2015   Normocytic anemia 07/12/2015   Genital herpes 07/12/2015   Hyperthyroidism 06/27/2015   Abnormal liver function test 06/27/2015   Pneumocystis jiroveci pneumonia (HCC) 06/27/2015     Problem List Items Addressed This Visit   None Visit Diagnoses     Human monkeypox    -  Primary        I am having Pinchos Bougher maintain his Multiple Vitamin (MULTIVITAMIN PO), Symtuza, Tivicay, and Tpoxx.   No orders of the defined types were placed in this encounter.    Follow-up: No follow-ups on file.

## 2020-12-20 NOTE — Patient Instructions (Signed)
Continue diary bring to next appointment Disinfect and sterilize once lesions have new skin growth Continue TPOXX as instructed Note for work to extend additional week Return 12/27/20 at 10 am Continue all other medications

## 2020-12-27 ENCOUNTER — Encounter: Payer: Self-pay | Admitting: Physician Assistant

## 2020-12-27 ENCOUNTER — Ambulatory Visit (INDEPENDENT_AMBULATORY_CARE_PROVIDER_SITE_OTHER): Payer: BC Managed Care – PPO | Admitting: Physician Assistant

## 2020-12-27 ENCOUNTER — Other Ambulatory Visit: Payer: Self-pay

## 2020-12-27 DIAGNOSIS — B04 Monkeypox: Secondary | ICD-10-CM | POA: Diagnosis not present

## 2020-12-27 DIAGNOSIS — B20 Human immunodeficiency virus [HIV] disease: Secondary | ICD-10-CM | POA: Diagnosis not present

## 2020-12-27 NOTE — Progress Notes (Signed)
Subjective:    Patient ID: Daniel Dunlap, male    DOB: 1980/07/22, 40 y.o.   MRN: [id number]  Chief Complaint  Patient presents with   final monkeypox visit     HPI:  Daniel Dunlap is a 40 y.o. male presents today for end of TPOXX treatment care.  He admits to missing 3 doses due size and quantity of pills. He has historically had pill burden as a barrier to treatment.  He has been taking ART regimen for HIV symtuza daily. He has tolerated all medications well without any SAEs or Aes.  He states lesions have scabbed over and new skin growth is evident.  He denies any other symptoms. He expresses that he must return to work financially and that his work does not involve contact with other members of community or surfaces that community members may come in contact.      No Known Allergies    Outpatient Medications Prior to Visit  Medication Sig Dispense Refill   Darunavir-Cobicisctat-Emtricitabine-Tenofovir Alafenamide (SYMTUZA) 800-150-200-10 MG TABS Take 1 tablet by mouth daily with breakfast. 30 tablet 2   dolutegravir (TIVICAY) 50 MG tablet Take 1 tablet (50 mg total) by mouth daily. 30 tablet 2   Multiple Vitamin (MULTIVITAMIN PO) Take by mouth.     tecovirimat (TPOXX) capsule Take 3 capsules (600 mg total) by mouth 2 (two) times daily with a meal for 14 days. 84 capsule 0   No facility-administered medications prior to visit.     Past Medical History:  Diagnosis Date   Genital warts    HIV infection (HCC)    HPV (human papilloma virus) infection    Thyroid disease      No past surgical history on file.     Review of Systems  Constitutional: Negative.   HENT:  Negative for mouth sores.   Respiratory: Negative.    Genitourinary:  Negative for genital sores.  Hematological:  Negative for adenopathy.  Psychiatric/Behavioral: Negative.       Objective:    There were no vitals taken for this visit. Nursing note and vital signs reviewed.  Physical  Exam Constitutional:      Appearance: Normal appearance.  HENT:     Head: Normocephalic and atraumatic.  Cardiovascular:     Rate and Rhythm: Normal rate and regular rhythm.  Pulmonary:     Effort: Pulmonary effort is normal.     Breath sounds: Normal breath sounds.  Skin:    Comments: Sites of previoius Lesions on left hand, face, extremities have new skin growth and no scabs.  2 lesions on back appear to have scab remaining, questionable new skin growth.  See photos  Neurological:     Mental Status: He is alert.  Psychiatric:        Mood and Affect: Mood normal.        Behavior: Behavior normal.   Received       Depression screen [facility] 2/9 12/13/2020 09/11/2020 08/14/2020 05/24/2019 01/25/2019  Decreased Interest 0 0 0 0 0  Down, Depressed, Hopeless 0 0 0 0 0  PHQ - 2 Score 0 0 0 0 0       Assessment & Plan:  MPX- received diary, will continue to complete final 2 doses of TPOXX with high fatty caloric meal as instructed I would like him to remain isolated for an additional week, concern for lesions on back not fully resolved. He assured me work does not involve contact with community or surfaces that others  will be in contact with, note to return to work provided.  AGAIN reiterated need to wear tight fitting mask and keep lesions covered if he must go out into the community for supplies.   HIV-adherent to current regimen, VL, CD4, CMP today-schedule to see [name] 9/15 at 1:45   Patient Active Problem List   Diagnosis Date Noted   Healthcare maintenance 05/24/2019   Rash 10/05/2018   Secondary syphilis 08/16/2018   Pain, dental 08/16/2018   Screening for STDs (sexually transmitted diseases) 04/26/2018   STI (sexually transmitted infection) 04/26/2018   Routine adult health maintenance 09/05/2016   HIV disease (HCC) 07/12/2015   Late latent syphilis 07/12/2015   Chronic hepatitis B (HCC) 07/12/2015   Normocytic anemia 07/12/2015   Genital herpes 07/12/2015   Hyperthyroidism  06/27/2015   Abnormal liver function test 06/27/2015   Pneumocystis jiroveci pneumonia (HCC) 06/27/2015     Problem List Items Addressed This Visit       Other   HIV disease (HCC)   Relevant Orders   HIV-1 RNA quant-no reflex-bld   T-helper cell (CD4)- ([facility] clinic only)   Comprehensive metabolic panel   Other Visit Diagnoses     Human monkeypox    -  Primary        I am having Daniel Dunlap maintain his Multiple Vitamin (MULTIVITAMIN PO), Symtuza, Tivicay, and Tpoxx.   No orders of the defined types were placed in this encounter.    Follow-up: Return in about 2 weeks (around 01/10/2021) for greg HIV care.

## 2020-12-27 NOTE — Patient Instructions (Signed)
[place]-I recommend isolating for another week and reducing exposure to community.  Work does not involve contact with other surfaces or community members, if you must go out in community please wear a tight fitting mask. 98% of your lesions have resolved which means you will continue to be contagious until all resolved. I have scheduled you with [name] in 3 weeks.  Follow up with him for HIV care.

## 2020-12-28 LAB — T-HELPER CELL (CD4) - (RCID CLINIC ONLY)
CD4 % Helper T Cell: 16 % — ABNORMAL LOW (ref 33–65)
CD4 T Cell Abs: 192 /uL — ABNORMAL LOW (ref 400–1790)

## 2020-12-30 LAB — COMPREHENSIVE METABOLIC PANEL
AG Ratio: 1 (calc) (ref 1.0–2.5)
ALT: 12 U/L (ref 9–46)
AST: 18 U/L (ref 10–40)
Albumin: 4 g/dL (ref 3.6–5.1)
Alkaline phosphatase (APISO): 69 U/L (ref 36–130)
BUN: 14 mg/dL (ref 7–25)
CO2: 26 mmol/L (ref 20–32)
Calcium: 8.8 mg/dL (ref 8.6–10.3)
Chloride: 101 mmol/L (ref 98–110)
Creat: 0.79 mg/dL (ref 0.60–1.29)
Globulin: 4 g/dL (calc) — ABNORMAL HIGH (ref 1.9–3.7)
Glucose, Bld: 88 mg/dL (ref 65–99)
Potassium: 4.2 mmol/L (ref 3.5–5.3)
Sodium: 133 mmol/L — ABNORMAL LOW (ref 135–146)
Total Bilirubin: 0.4 mg/dL (ref 0.2–1.2)
Total Protein: 8 g/dL (ref 6.1–8.1)

## 2020-12-30 LAB — HIV-1 RNA QUANT-NO REFLEX-BLD
HIV 1 RNA Quant: 40400 Copies/mL — ABNORMAL HIGH
HIV-1 RNA Quant, Log: 4.61 Log cps/mL — ABNORMAL HIGH

## 2021-01-10 ENCOUNTER — Ambulatory Visit: Payer: BC Managed Care – PPO | Admitting: Family

## 2021-02-18 ENCOUNTER — Telehealth: Payer: Self-pay

## 2021-02-18 NOTE — Telephone Encounter (Signed)
Called patient to schedule for overdue follow up appointment, no answer. Left HIPAA compliant voicemail requesting callback.   Elliet Goodnow D Autumne Kallio, RN  

## 2021-04-17 ENCOUNTER — Ambulatory Visit (INDEPENDENT_AMBULATORY_CARE_PROVIDER_SITE_OTHER): Payer: 59 | Admitting: Pharmacist

## 2021-04-17 ENCOUNTER — Other Ambulatory Visit (HOSPITAL_COMMUNITY)
Admission: RE | Admit: 2021-04-17 | Discharge: 2021-04-17 | Disposition: A | Discharge status: Home / Self Care | Payer: BC Managed Care – PPO | Source: Ambulatory Visit | Attending: Family | Admitting: Family

## 2021-04-17 ENCOUNTER — Other Ambulatory Visit: Payer: Self-pay

## 2021-04-17 ENCOUNTER — Other Ambulatory Visit (HOSPITAL_COMMUNITY): Payer: Self-pay

## 2021-04-17 ENCOUNTER — Telehealth: Payer: Self-pay | Admitting: Pharmacist

## 2021-04-17 DIAGNOSIS — Z202 Contact with and (suspected) exposure to infections with a predominantly sexual mode of transmission: Secondary | ICD-10-CM

## 2021-04-17 DIAGNOSIS — B20 Human immunodeficiency virus [HIV] disease: Secondary | ICD-10-CM | POA: Diagnosis not present

## 2021-04-17 MED ORDER — TIVICAY 50 MG PO TABS
50.0000 mg | ORAL_TABLET | Freq: Every day | ORAL | 3 refills | Status: DC
  Filled 2021-04-17: qty 30, 30d supply, fill #0

## 2021-04-17 MED ORDER — SYMTUZA 800-150-200-10 MG PO TABS
1.0000 | ORAL_TABLET | Freq: Every day | ORAL | 3 refills | Status: DC
  Filled 2021-04-17: qty 30, 30d supply, fill #0

## 2021-04-17 MED ORDER — PENICILLIN G BENZATHINE 1200000 UNIT/2ML IM SUSY
2.4000 10*6.[IU] | PREFILLED_SYRINGE | Freq: Once | INTRAMUSCULAR | Status: AC
  Administered 2021-04-17: 12:00:00 2.4 10*6.[IU] via INTRAMUSCULAR

## 2021-04-17 NOTE — Telephone Encounter (Signed)
Cumulative HIV Genotype Data  Genotype Dates: 2012, 2017, 2019, and 2020  RT Mutations A62V, K70KERT, M184MV, E138A, Y188L, S3I, G15V, V35T, E36L, I37N,T 39L, I63A, D86E,S 162C, K173E, Q174K, R211RK, V245DE, A288S, V292I, I293V, K311R  PI Mutations  I15V, M36L, I62V, L63A  Integrase Mutations     Interpretation of Genotype Data per [employer] HIV Drug Resistance Database:  Nucleoside RTIs  Abacavir - Intermediate resistance  Zidovudine - Low-level resistance  Emtricitabine - High-level resistance  Lamivudine - High-level resistance  Tenofovir - Low-level resistance    Non-Nucleoside RTIs  Doravirine - High-level resistance  Efavirenz - High-level resistance  Etravirine - Low-level resistance  Nevirapine - High-level resistance  Rilpivirine - High-level resistance    Protease Inhibitors  Atazanavir - Susceptible Darunavir - Susceptible Lopinavir - Susceptible   Integrase Inhibitors  Bictegravir - Susceptible Cabotegravir - Susceptible Dolutegravir - Susceptible Elvitegravir - Susceptible Raltegravir - Susceptible   Margarite Gouge, PharmD, CPP Clinical Pharmacist Practitioner Infectious Diseases Clinical Pharmacist [facility]

## 2021-04-17 NOTE — Progress Notes (Signed)
HPI: Daniel Dunlap is a 40 y.o. male who presents to the [facility] clinic for STI care and HIV follow-up.  Patient Active Problem List   Diagnosis Date Noted   Healthcare maintenance 05/24/2019   Rash 10/05/2018   Secondary syphilis 08/16/2018   Pain, dental 08/16/2018   Screening for STDs (sexually transmitted diseases) 04/26/2018   STI (sexually transmitted infection) 04/26/2018   Routine adult health maintenance 09/05/2016   HIV disease (HCC) 07/12/2015   Late latent syphilis 07/12/2015   Chronic hepatitis B (HCC) 07/12/2015   Normocytic anemia 07/12/2015   Genital herpes 07/12/2015   Hyperthyroidism 06/27/2015   Abnormal liver function test 06/27/2015   Pneumocystis jiroveci pneumonia (HCC) 06/27/2015    Patient's Medications  New Prescriptions   No medications on file  Previous Medications   No medications on file  Modified Medications   Modified Medication Previous Medication   DARUNAVIR-COBICISTAT-EMTRICITABINE-TENOFOVIR ALAFENAMIDE (SYMTUZA) 800-150-200-10 MG TABS Darunavir-Cobicisctat-Emtricitabine-Tenofovir Alafenamide (SYMTUZA) 800-150-200-10 MG TABS      Take 1 tablet by mouth daily with breakfast.    Take 1 tablet by mouth daily with breakfast.   DOLUTEGRAVIR (TIVICAY) 50 MG TABLET dolutegravir (TIVICAY) 50 MG tablet      Take 1 tablet (50 mg total) by mouth daily.    Take 1 tablet (50 mg total) by mouth daily.  Discontinued Medications   MULTIPLE VITAMIN (MULTIVITAMIN PO)    Take by mouth.    Allergies: No Known Allergies  Past Medical History: Past Medical History:  Diagnosis Date   Genital warts    HIV infection (HCC)    HPV (human papilloma virus) infection    Thyroid disease     Social History: Social History   Socioeconomic History   Marital status: Single    Spouse name: Not on file   Number of children: 0   Years of education: 16   Highest education level: Not on file  Occupational History   Not on file  Tobacco Use   Smoking  status: Every Day    Types: Cigarettes   Smokeless tobacco: Never   Tobacco comments:    1-2 cigarettes a day   Vaping Use   Vaping Use: Never used  Substance and Sexual Activity   Alcohol use: Yes    Alcohol/week: 1.0 standard drink    Types: 1 Glasses of wine per week   Drug use: No   Sexual activity: Yes    Partners: Female, Male    Birth control/protection: None    Comment: declined condoms 09/11/20  Other Topics Concern   Not on file  Social History Narrative   Fun: Play music, listen to music.    Social Determinants of Health   Financial Resource Strain: Not on file  Food Insecurity: Not on file  Transportation Needs: Not on file  Physical Activity: Not on file  Stress: Not on file  Social Connections: Not on file    Labs: Lab Results  Component Value Date   HIV1RNAQUANT 40,400 (H) 12/27/2020   HIV1RNAQUANT 46 (H) 09/11/2020   HIV1RNAQUANT 55,200 (H) 07/31/2020   CD4TABS 192 (L) 12/27/2020   CD4TABS 260 (L) 09/11/2020   CD4TABS 214 (L) 07/31/2020    RPR and STI Lab Results  Component Value Date   LABRPR REACTIVE (A) 12/13/2020   LABRPR REACTIVE (A) 07/31/2020   LABRPR REACTIVE (A) 07/20/2019   LABRPR REACTIVE (A) 04/26/2019   LABRPR REACTIVE (A) 07/26/2018   RPRTITER 1:16 (H) 12/13/2020   RPRTITER 1:64 (H) 07/31/2020  RPRTITER 1:8 (H) 07/20/2019   RPRTITER 1:16 (H) 04/26/2019   RPRTITER 1:8 (H) 07/26/2018    STI Results GC CT  12/13/2020 Negative Negative  12/13/2020 Negative Negative  12/13/2020 Negative Negative  07/31/2020 Negative Negative  07/20/2019 Negative Negative  04/26/2018 Negative Negative  04/26/2018 Negative Negative  04/26/2018 Negative Negative  08/06/2017 Negative Negative  06/12/2015 Negative Negative    Hepatitis B Lab Results  Component Value Date   HEPBSAB NON-REACTIVE 07/20/2019   HEPBSAG REACTIVE (A) 07/20/2019   HEPBCAB REACTIVE (A) 12/08/2017   Hepatitis C No results found for: HEPCAB, HCVRNAPCRQN Hepatitis A Lab  Results  Component Value Date   HAV NON REACTIVE 06/12/2015   Lipids: Lab Results  Component Value Date   CHOL 142 07/31/2020   TRIG 88 07/31/2020   HDL 52 07/31/2020   CHOLHDL 2.7 07/31/2020   VLDL 12 08/12/2016   LDLCALC 73 07/31/2020    Current HIV Regimen: Tivicay + Symtuza   Assessment: Daniel Dunlap presents to clinic today for STI treatment. He demonstrates non-pruritic papular rash on his neck and face (as seen in media tab) which has been present for 1 week. He denies rashes anywhere else or any other STI symptoms such as urinary discharge or rectal pain. He is only active with one sexual partner, and they were last active 1 week ago. They did not use condoms, and he performed oral sex. He is a versatile partner and denies any other sexual partners in the past months. He states he did not think his partner was sexually active outside of their relationship, but this could certainly be possible. Will presumptively treat for secondary syphilis today and check RPR and urine/oral/rectal cytologies.  Patient also needs to be evaluated for HIV. He last saw [name] in May for care and Tresa Endo in September for monkeypox, but he no showed his appointment in September. He seemed slightly uncomfortable discussing his adherence to his [place] and Tivicay today. He states he has been taking them both everyday without any missed doses or side effects, yet our records show his last fill was in September. Additionally, his viral load was over 40,000 in September. His viral load has frequent fluctuations, and his CD4 count also has remained borderline ~200. Discussed multiple ways to improve adherence (though he claims perfect adherence today) such as setting phone alarms, using pill keychain, using pillboxes, and setting calendar reminders. He states he already does all of these things everyday and that he takes his medicine every morning before he goes to work. States he received surplus of medication, and that  is why he has not run out yet. When asked how many pills he last left, he cannot recall. Discussed that if he is perfectly adherent and continues with elevated HIV RNA, we will need to assess for further resistance development to his current regimen and that our options become far more limited.   Discussed sending his prescriptions to [facility] for delivery, and he is agreeable use our pharmacy. Will check HIV RNA and CD4 today. He will follow-up with [name] in 3 months.   Plan: Check HIV RNA, CD4, RPR, urine/rectal/oral cytologies Administer penicillin G 2.4 million units x1 Prescribe Symtuza/Tivicay x 4 months Follow-up with [name] on 07/16/21 at 3:15pm  Margarite Gouge, PharmD, CPP Clinical Pharmacist Practitioner Infectious Diseases Clinical Pharmacist [facility] 04/17/2021, 12:22 PM

## 2021-04-18 LAB — CYTOLOGY, (ORAL, ANAL, URETHRAL) ANCILLARY ONLY
Chlamydia: NEGATIVE
Chlamydia: NEGATIVE
Comment: NEGATIVE
Comment: NEGATIVE
Comment: NORMAL
Comment: NORMAL
Neisseria Gonorrhea: NEGATIVE
Neisseria Gonorrhea: NEGATIVE

## 2021-04-18 LAB — URINE CYTOLOGY ANCILLARY ONLY
Chlamydia: NEGATIVE
Comment: NEGATIVE
Comment: NORMAL
Neisseria Gonorrhea: NEGATIVE

## 2021-04-19 ENCOUNTER — Other Ambulatory Visit (HOSPITAL_COMMUNITY): Payer: Self-pay

## 2021-04-19 LAB — RPR TITER: RPR Titer: 1:32 {titer} — ABNORMAL HIGH

## 2021-04-19 LAB — T-HELPER CELLS (CD4) COUNT (NOT AT ARMC)
CD4 % Helper T Cell: 21 % — ABNORMAL LOW (ref 33–65)
CD4 T Cell Abs: 268 /uL — ABNORMAL LOW (ref 400–1790)

## 2021-04-19 LAB — FLUORESCENT TREPONEMAL AB(FTA)-IGG-BLD: Fluorescent Treponemal ABS: REACTIVE — AB

## 2021-04-19 LAB — HIV-1 RNA QUANT-NO REFLEX-BLD
HIV 1 RNA Quant: 561 Copies/mL — ABNORMAL HIGH
HIV-1 RNA Quant, Log: 2.75 Log cps/mL — ABNORMAL HIGH

## 2021-04-19 LAB — RPR: RPR Ser Ql: REACTIVE — AB

## 2021-04-23 ENCOUNTER — Other Ambulatory Visit (HOSPITAL_COMMUNITY): Payer: Self-pay

## 2021-04-30 ENCOUNTER — Other Ambulatory Visit (HOSPITAL_COMMUNITY): Payer: Self-pay

## 2021-05-08 ENCOUNTER — Other Ambulatory Visit (HOSPITAL_COMMUNITY): Payer: Self-pay

## 2021-07-16 ENCOUNTER — Ambulatory Visit: Payer: BC Managed Care – PPO | Admitting: Family

## 2021-07-18 ENCOUNTER — Ambulatory Visit: Payer: BC Managed Care – PPO | Admitting: Family

## 2021-12-16 ENCOUNTER — Emergency Department (HOSPITAL_COMMUNITY): Payer: 59

## 2021-12-16 ENCOUNTER — Inpatient Hospital Stay (HOSPITAL_COMMUNITY)
Admission: EM | Admit: 2021-12-16 | Discharge: 2021-12-22 | DRG: 603 | Disposition: A | Discharge status: Home / Self Care | Payer: 59 | Attending: Internal Medicine | Admitting: Internal Medicine

## 2021-12-16 ENCOUNTER — Encounter (HOSPITAL_COMMUNITY): Payer: Self-pay

## 2021-12-16 ENCOUNTER — Other Ambulatory Visit: Payer: Self-pay

## 2021-12-16 DIAGNOSIS — E663 Overweight: Secondary | ICD-10-CM | POA: Diagnosis present

## 2021-12-16 DIAGNOSIS — L0390 Cellulitis, unspecified: Secondary | ICD-10-CM | POA: Diagnosis present

## 2021-12-16 DIAGNOSIS — L03311 Cellulitis of abdominal wall: Secondary | ICD-10-CM | POA: Diagnosis not present

## 2021-12-16 DIAGNOSIS — K5900 Constipation, unspecified: Secondary | ICD-10-CM | POA: Diagnosis present

## 2021-12-16 DIAGNOSIS — L739 Follicular disorder, unspecified: Secondary | ICD-10-CM | POA: Diagnosis present

## 2021-12-16 DIAGNOSIS — K6289 Other specified diseases of anus and rectum: Secondary | ICD-10-CM | POA: Diagnosis present

## 2021-12-16 DIAGNOSIS — E876 Hypokalemia: Secondary | ICD-10-CM | POA: Diagnosis present

## 2021-12-16 DIAGNOSIS — E871 Hypo-osmolality and hyponatremia: Secondary | ICD-10-CM | POA: Diagnosis present

## 2021-12-16 DIAGNOSIS — B20 Human immunodeficiency virus [HIV] disease: Secondary | ICD-10-CM | POA: Diagnosis present

## 2021-12-16 DIAGNOSIS — L02211 Cutaneous abscess of abdominal wall: Secondary | ICD-10-CM | POA: Diagnosis present

## 2021-12-16 DIAGNOSIS — Z6829 Body mass index (BMI) 29.0-29.9, adult: Secondary | ICD-10-CM

## 2021-12-16 DIAGNOSIS — E8809 Other disorders of plasma-protein metabolism, not elsewhere classified: Secondary | ICD-10-CM | POA: Diagnosis present

## 2021-12-16 DIAGNOSIS — E869 Volume depletion, unspecified: Secondary | ICD-10-CM | POA: Diagnosis present

## 2021-12-16 DIAGNOSIS — Z79899 Other long term (current) drug therapy: Secondary | ICD-10-CM

## 2021-12-16 DIAGNOSIS — B181 Chronic viral hepatitis B without delta-agent: Secondary | ICD-10-CM | POA: Diagnosis present

## 2021-12-16 DIAGNOSIS — A419 Sepsis, unspecified organism: Secondary | ICD-10-CM

## 2021-12-16 DIAGNOSIS — F17210 Nicotine dependence, cigarettes, uncomplicated: Secondary | ICD-10-CM | POA: Diagnosis present

## 2021-12-16 DIAGNOSIS — D649 Anemia, unspecified: Secondary | ICD-10-CM | POA: Diagnosis present

## 2021-12-16 DIAGNOSIS — R55 Syncope and collapse: Secondary | ICD-10-CM | POA: Diagnosis present

## 2021-12-16 LAB — TROPONIN I (HIGH SENSITIVITY): Troponin I (High Sensitivity): 3 ng/L (ref <18)

## 2021-12-16 LAB — BASIC METABOLIC PANEL
Anion gap: 10 (ref 5–15)
BUN: 16 mg/dL (ref 6–20)
CO2: 22 mmol/L (ref 22–32)
Calcium: 9 mg/dL (ref 8.9–10.3)
Chloride: 95 mmol/L — ABNORMAL LOW (ref 98–111)
Creatinine, Ser: 1.16 mg/dL (ref 0.61–1.24)
GFR, Estimated: >60 mL/min (ref >60)
Glucose, Bld: 127 mg/dL — ABNORMAL HIGH (ref 70–99)
Potassium: 3.3 mmol/L — ABNORMAL LOW (ref 3.5–5.1)
Sodium: 127 mmol/L — ABNORMAL LOW (ref 135–145)

## 2021-12-16 LAB — URINALYSIS, ROUTINE W REFLEX MICROSCOPIC
Bilirubin Urine: NEGATIVE
Glucose, UA: NEGATIVE mg/dL
Ketones, ur: NEGATIVE mg/dL
Leukocytes,Ua: NEGATIVE
Nitrite: NEGATIVE
Protein, ur: 30 mg/dL — AB
Specific Gravity, Urine: 1.028 (ref 1.005–1.030)
pH: 5 (ref 5.0–8.0)

## 2021-12-16 LAB — CBC
HCT: 43.7 % (ref 39.0–52.0)
Hemoglobin: 14.8 g/dL (ref 13.0–17.0)
MCH: 28.4 pg (ref 26.0–34.0)
MCHC: 33.9 g/dL (ref 30.0–36.0)
MCV: 83.7 fL (ref 80.0–100.0)
Platelets: 227 10*3/uL (ref 150–400)
RBC: 5.22 MIL/uL (ref 4.22–5.81)
RDW: 14.6 % (ref 11.5–15.5)
WBC: 11.3 10*3/uL — ABNORMAL HIGH (ref 4.0–10.5)
nRBC: 0 % (ref 0.0–0.2)

## 2021-12-16 LAB — LACTIC ACID, PLASMA: Lactic Acid, Venous: 1.4 mmol/L (ref 0.5–1.9)

## 2021-12-16 LAB — CBG MONITORING, ED: Glucose-Capillary: 126 mg/dL — ABNORMAL HIGH (ref 70–99)

## 2021-12-16 MED ORDER — LACTATED RINGERS IV SOLN: INTRAVENOUS | Status: AC

## 2021-12-16 MED ORDER — FENTANYL CITRATE PF 50 MCG/ML IJ SOSY
50.0000 ug | PREFILLED_SYRINGE | Freq: Once | INTRAMUSCULAR | Status: AC
  Administered 2021-12-16: 50 ug via INTRAVENOUS
  Filled 2021-12-16: qty 1

## 2021-12-16 MED ORDER — IOHEXOL 300 MG/ML  SOLN
100.0000 mL | Freq: Once | INTRAMUSCULAR | Status: AC | PRN
  Administered 2021-12-16: 100 mL via INTRAVENOUS

## 2021-12-16 MED ORDER — METRONIDAZOLE 500 MG/100ML IV SOLN
500.0000 mg | Freq: Once | INTRAVENOUS | Status: AC
  Administered 2021-12-16: 500 mg via INTRAVENOUS
  Filled 2021-12-16: qty 100

## 2021-12-16 MED ORDER — OXYCODONE HCL 5 MG PO TABS
5.0000 mg | ORAL_TABLET | ORAL | Status: DC | PRN
  Administered 2021-12-16 – 2021-12-22 (×8): 5 mg via ORAL
  Filled 2021-12-16 (×9): qty 1

## 2021-12-16 MED ORDER — SODIUM CHLORIDE 0.9 % IV SOLN
2.0000 g | Freq: Once | INTRAVENOUS | Status: AC
  Administered 2021-12-16: 2 g via INTRAVENOUS
  Filled 2021-12-16: qty 20

## 2021-12-16 MED ORDER — ACETAMINOPHEN 650 MG RE SUPP: 650.0000 mg | Freq: Four times a day (QID) | RECTAL | Status: DC | PRN

## 2021-12-16 MED ORDER — SODIUM CHLORIDE 0.9 % IV BOLUS
1000.0000 mL | Freq: Once | INTRAVENOUS | Status: AC
Start: 2021-12-16 — End: 2021-12-16
  Administered 2021-12-16: 1000 mL via INTRAVENOUS

## 2021-12-16 MED ORDER — ONDANSETRON HCL 4 MG/2ML IJ SOLN: 4.0000 mg | Freq: Four times a day (QID) | INTRAMUSCULAR | Status: DC | PRN

## 2021-12-16 MED ORDER — MORPHINE SULFATE (PF) 2 MG/ML IV SOLN
1.0000 mg | INTRAVENOUS | Status: DC | PRN
  Administered 2021-12-18: 1 mg via INTRAVENOUS
  Filled 2021-12-16: qty 1

## 2021-12-16 MED ORDER — DOLUTEGRAVIR SODIUM 50 MG PO TABS
50.0000 mg | ORAL_TABLET | Freq: Every day | ORAL | Status: DC
  Administered 2021-12-17 – 2021-12-22 (×6): 50 mg via ORAL
  Filled 2021-12-16 (×6): qty 1

## 2021-12-16 MED ORDER — POTASSIUM CHLORIDE 20 MEQ PO PACK
40.0000 meq | PACK | Freq: Once | ORAL | Status: AC
  Administered 2021-12-16: 40 meq via ORAL
  Filled 2021-12-16: qty 2

## 2021-12-16 MED ORDER — SENNOSIDES-DOCUSATE SODIUM 8.6-50 MG PO TABS: 1.0000 | ORAL_TABLET | Freq: Every evening | ORAL | Status: DC | PRN

## 2021-12-16 MED ORDER — ONDANSETRON HCL 4 MG PO TABS: 4.0000 mg | ORAL_TABLET | Freq: Four times a day (QID) | ORAL | Status: DC | PRN

## 2021-12-16 MED ORDER — ACETAMINOPHEN 325 MG PO TABS
650.0000 mg | ORAL_TABLET | Freq: Four times a day (QID) | ORAL | Status: DC | PRN
  Administered 2021-12-16 – 2021-12-17 (×2): 650 mg via ORAL
  Filled 2021-12-16 (×3): qty 2

## 2021-12-16 MED ORDER — ENOXAPARIN SODIUM 40 MG/0.4ML IJ SOSY
40.0000 mg | PREFILLED_SYRINGE | INTRAMUSCULAR | Status: DC
  Administered 2021-12-17 – 2021-12-18 (×2): 40 mg via SUBCUTANEOUS
  Filled 2021-12-16 (×5): qty 0.4

## 2021-12-16 MED ORDER — DARUN-COBIC-EMTRICIT-TENOFAF 800-150-200-10 MG PO TABS
1.0000 | ORAL_TABLET | Freq: Every day | ORAL | Status: DC
  Administered 2021-12-17 – 2021-12-22 (×6): 1 via ORAL
  Filled 2021-12-16 (×7): qty 1

## 2021-12-16 MED ORDER — SODIUM CHLORIDE 0.9 % IV SOLN
1.0000 g | INTRAVENOUS | Status: DC
  Administered 2021-12-17: 1 g via INTRAVENOUS
  Filled 2021-12-16 (×2): qty 10

## 2021-12-16 MED ORDER — VANCOMYCIN HCL 1750 MG/350ML IV SOLN
1750.0000 mg | Freq: Once | INTRAVENOUS | Status: AC
  Administered 2021-12-16: 1750 mg via INTRAVENOUS
  Filled 2021-12-16 (×2): qty 350

## 2021-12-16 NOTE — ED Triage Notes (Signed)
Pt reports having some swelling to LLQ area since Friday. Pt reports he stood up this morning and had sharp pain which caused him to pass out and hit his head. Pt denies confusion, N/V, or any other injuries from LOC.

## 2021-12-16 NOTE — Hospital Course (Signed)
Daniel Dunlap is a 41 y.o. male with medical history significant for HIV (RNA 561, CD4 268 on 04/17/2021) on Symtuza/Tivicay who is admitted with left lower abdominal wall cellulitis.

## 2021-12-16 NOTE — Assessment & Plan Note (Signed)
Left lower abdominal cellulitis after initial folliculitis.  Has mild leukocytosis with WBC 11.3. -Continue IV ceftriaxone

## 2021-12-16 NOTE — Progress Notes (Addendum)
A consult was received from an ED physician for pharmacy to select/dose antibiotics for abdominal wall cellulitis and possible proctitis.  The patient's profile has been reviewed for ht/wt/allergies/indication/available labs.   A one time order has been placed for vancomycin 1750mg  IV x1, ceftriaxone 2g IV x1, and metronidazole 500mg  IV x1.     Further antibiotics/pharmacy consults should be ordered by admitting physician if indicated.                       Thank you,  , PharmD, BCPS 12/16/2021 8:50 PM

## 2021-12-16 NOTE — ED Provider Notes (Signed)
[facility]-EMERGENCY DEPT Provider Note   CSN: [id number] Arrival date & time: 12/16/21  1703     History  Chief Complaint  Patient presents with   Abdominal Pain   Loss of Consciousness    Daniel Dunlap is a 41 y.o. male.  Patient here with left lower abdominal pain for last 4 days.  The pain was so bad this morning that he passed out and hit his head.  He has a history of HIV and is compliant with his medications.  He has noticed some redness and swelling in the left lower abdomen.  He had a little hair follicle that looked inflamed and he tried to pop it but ever since has been getting more red and painful.  Nothing makes it worse or better.  Denies any chest pain, shortness of breath, nausea, vomiting.  The history is provided by the patient.       Home Medications Prior to Admission medications   Medication Sig Start Date End Date Taking? Authorizing Provider  Darunavir-Cobicistat-Emtricitabine-Tenofovir Alafenamide [facility]) [id number] MG TABS Take 1 tablet by mouth daily with breakfast. 04/17/21   Jennette Kettle, RPH-CPP  dolutegravir (TIVICAY) 50 MG tablet Take 1 tablet (50 mg total) by mouth daily. 04/17/21   Jennette Kettle, RPH-CPP      Allergies    Patient has no known allergies.    Review of Systems   Review of Systems  Physical Exam Updated Vital Signs BP 133/85   Pulse 83   Temp 99.9 F (37.7 C) (Oral)   Resp 19   Ht 5\' 10"  (1.778 m)   Wt 93.3 kg   SpO2 100%   BMI 29.51 kg/m  Physical Exam Vitals and nursing note reviewed.  Constitutional:      General: He is not in acute distress.    Appearance: He is well-developed.  HENT:     Head: Normocephalic and atraumatic.  Eyes:     Conjunctiva/sclera: Conjunctivae normal.  Cardiovascular:     Rate and Rhythm: Normal rate and regular rhythm.     Heart sounds: Normal heart sounds. No murmur heard. Pulmonary:     Effort: Pulmonary effort is normal. No respiratory distress.      Breath sounds: Normal breath sounds.  Abdominal:     Palpations: Abdomen is soft.     Tenderness: There is abdominal tenderness in the left lower quadrant.     Comments: Area of redness and warmth in the left lower abdomen around the hair follicle, no purulent drainage, cellulitic appearing, no obvious fluctuance or drainage  Musculoskeletal:        General: No swelling.     Cervical back: Neck supple.  Skin:    General: Skin is warm and dry.     Capillary Refill: Capillary refill takes less than 2 seconds.  Neurological:     General: No focal deficit present.     Mental Status: He is alert.  Psychiatric:        Mood and Affect: Mood normal.     ED Results / Procedures / Treatments   Labs (all labs ordered are listed, but only abnormal results are displayed) Labs Reviewed  BASIC METABOLIC PANEL - Abnormal; Notable for the following components:      Result Value   Sodium 127 (*)    Potassium 3.3 (*)    Chloride 95 (*)    Glucose, Bld 127 (*)    All other components within normal limits  CBC - Abnormal;  Notable for the following components:   WBC 11.3 (*)    All other components within normal limits  URINALYSIS, ROUTINE W REFLEX MICROSCOPIC - Abnormal; Notable for the following components:   Color, Urine AMBER (*)    Hgb urine dipstick SMALL (*)    Protein, ur 30 (*)    Bacteria, UA RARE (*)    All other components within normal limits  CBG MONITORING, ED - Abnormal; Notable for the following components:   Glucose-Capillary 126 (*)    All other components within normal limits  CULTURE, BLOOD (ROUTINE X 2)  CULTURE, BLOOD (ROUTINE X 2)  LACTIC ACID, PLASMA  LACTIC ACID, PLASMA  T-HELPER CELLS (CD4) COUNT (NOT AT [facility])  HIV-1 RNA QUANT-NO REFLEX-BLD  TROPONIN I (HIGH SENSITIVITY)    EKG EKG Interpretation  Date/Time:  Monday December 16 2021 17:37:38 EDT Ventricular Rate:  103 PR Interval:  130 QRS Duration: 92 QT Interval:  321 QTC Calculation: 421 R  Axis:   91 Text Interpretation: Sinus tachycardia Consider RVH or posterior infarct Confirmed by Virgina Norfolk ([id number]) on 12/16/2021 7:57:59 PM  Radiology CT ABDOMEN PELVIS W CONTRAST  Result Date: 12/16/2021 CLINICAL DATA:  Left lower quadrant abdominal pain. EXAM: CT ABDOMEN AND PELVIS WITH CONTRAST TECHNIQUE: Multidetector CT imaging of the abdomen and pelvis was performed using the standard protocol following bolus administration of intravenous contrast. RADIATION DOSE REDUCTION: This exam was performed according to the departmental dose-optimization program which includes automated exposure control, adjustment of the mA and/or kV according to patient size and/or use of iterative reconstruction technique. CONTRAST:  OMNIPAQUE IOHEXOL 300 MG/ML  SOLN COMPARISON:  None Available. FINDINGS: Lower chest: The visualized lung bases are clear. No intra-abdominal free air or free fluid. Hepatobiliary: Mild fatty liver. No biliary dilatation. The gallbladder is unremarkable. Pancreas: Unremarkable. No pancreatic ductal dilatation or surrounding inflammatory changes. Spleen: Normal in size without focal abnormality. Adrenals/Urinary Tract: The adrenal glands unremarkable. The kidneys, visualized ureters, and urinary bladder appear unremarkable. Stomach/Bowel: There is mild circumferential thickening of the rectum with mild perirectal haziness. Clinical correlation is recommended to evaluate for possibility of proctitis. There is no bowel obstruction. The appendix is normal. Vascular/Lymphatic: The abdominal aorta and IVC unremarkable. No portal venous gas. Left external iliac chain adenopathy measures 19 mm, likely reactive. Reproductive: The prostate and seminal vesicles are grossly unremarkable. No pelvic mass. Other: Inflammatory changes of the skin and subcutaneous soft tissues of the left anterior pelvic wall suspicious for cellulitis. No drainable fluid collection or abscess. No soft tissue gas.  Musculoskeletal: No acute or significant osseous findings. IMPRESSION: 1. Mild circumferential thickening of the rectum with mild perirectal haziness. Clinical correlation is recommended to evaluate for possibility of proctitis. 2. No bowel obstruction. Normal appendix. 3. Inflammatory changes of the skin and subcutaneous soft tissues of the left anterior pelvic wall suspicious for cellulitis. No drainable fluid collection or abscess. Electronically Signed   By: Elgie Collard M.D.   On: 12/16/2021 20:57   CT HEAD WO CONTRAST ( )  Result Date: 12/16/2021 CLINICAL DATA:  Trauma EXAM: CT HEAD WITHOUT CONTRAST TECHNIQUE: Contiguous axial images were obtained from the base of the skull through the vertex without intravenous contrast. RADIATION DOSE REDUCTION: This exam was performed according to the departmental dose-optimization program which includes automated exposure control, adjustment of the mA and/or kV according to patient size and/or use of iterative reconstruction technique. COMPARISON:  None Available. FINDINGS: Brain: No acute intracranial findings are seen. There are no signs of  bleeding within the cranium. Ventricles are not dilated. There is no focal edema or mass effect. Vascular: Unremarkable. Skull: No fracture is seen in calvarium. Sinuses/Orbits: Unremarkable. Other: None. IMPRESSION: No acute intracranial findings are seen in noncontrast CT brain. Electronically Signed   By: Ernie Avena M.D.   On: 12/16/2021 17:59    Procedures .Critical Care  Performed by: Virgina Norfolk, DO Authorized by: Virgina Norfolk, DO   Critical care provider statement:    Critical care time (minutes):  35   Critical care was necessary to treat or prevent imminent or life-threatening deterioration of the following conditions:  Sepsis   Critical care was time spent personally by me on the following activities:  Development of treatment plan with patient or surrogate, blood draw for specimens,  examination of patient, discussions with primary provider, evaluation of patient's response to treatment, obtaining history from patient or surrogate, ordering and performing treatments and interventions, ordering and review of laboratory studies, ordering and review of radiographic studies, pulse oximetry and re-evaluation of patient's condition   Care discussed with: admitting provider       Medications Ordered in ED Medications  vancomycin (VANCOREADY) IVPB 1750 mg/350 mL (has no administration in time range)  cefTRIAXone (ROCEPHIN) 2 g in sodium chloride 0.9 % 100 mL IVPB (has no administration in time range)  metroNIDAZOLE (FLAGYL) IVPB 500 mg (has no administration in time range)  sodium chloride 0.9 % bolus 1,000 mL (1,000 mLs Intravenous New Bag/Given 12/16/21 2033)  iohexol (OMNIPAQUE) 300 MG/ML solution 100 mL (100 mLs Intravenous Contrast Given 12/16/21 2043)    ED Course/ Medical Decision Making/ A&P                           Medical Decision Making Amount and/or Complexity of Data Reviewed Labs: ordered. Radiology: ordered.  Risk Prescription drug management. Decision regarding hospitalization.   Daniel Dunlap is here with abdominal pain, syncopal event.  Patient with history of HIV on triple therapy, last CD4 count over 8 months ago was in the 200s, he did have some mild HIV viral load as well.  He has been having left lower abdominal pain for the last several days with redness and swelling.  He is concerned for infection.  The pain was so bad today that he fell/lost consciousness.  He has extensive redness and swelling to the left lower abdomen around the area of folliculitis.  He is very tender in this area.  Originally his temperature was somewhat unremarkable but on repeat it is 99.9 orally.  He is tachycardic in the low 100s.  He has had some blood work already done including head CT prior to my evaluation.  Will initiate sepsis protocol at this time given concern for  infectious process now with fever and tachycardia.  Given that he has HIV with concern for may be issues with a CD4 will consult pharmacy for broad-spectrum IV antibiotics.  Patient was white count of 11.3 per my review and interpretation of labs.  CT scan of the abdomen and pelvis performed shows extensive cellulitis in the left lower abdomen.  Possible proctitis but he does not have any rectal pain.  My suspicion that the source for infection is this abdominal wall cellulitis.  Given his history of HIV and CD4 counts have been in the 200s most recently believe observation to rule out sepsis and get some dose of IV antibiotics is appropriate.  He has been admitted to medicine service  for further care.  Lab work was otherwise unremarkable.  Head CT normal.  No concern for other process at this time.  This chart was dictated using voice recognition software.  Despite best efforts to proofread,  errors can occur which can change the documentation meaning.         Final Clinical Impression(s) / ED Diagnoses Final diagnoses:  Abdominal wall cellulitis  Sepsis, due to unspecified organism, unspecified whether acute organ dysfunction present [facility])    Rx / DC Orders ED Discharge Orders     None         Virgina Norfolk, DO 12/16/21 2116

## 2021-12-16 NOTE — ED Provider Triage Note (Signed)
Emergency Medicine Provider Triage Evaluation Note  Randel Hargens , a 41 y.o. male  was evaluated in triage.  Pt complains of lump, abd discomfort in LLQ states started 4 days ago.   States pain was so bad this morning he passed out and hit his head.  Hx HIV and takes meds  Review of Systems  Positive: Left abd wall pain Negative: Fever (that he knows of)  Physical Exam  BP 126/83 (BP Location: Left Arm)   Pulse 100   Temp 99.4 F (37.4 C) (Oral)   Resp 18   Ht 5\' 10"  (1.778 m)   Wt 93.3 kg   SpO2 95%   BMI 29.51 kg/m  Gen:   Awake, no distress   Resp:  Normal effort  MSK:   Moves extremities without difficulty  Other:  LLQ of abd with erytematous TTP skin w central point   Medical Decision Making  Medically screening exam initiated at 5:45 PM.  Appropriate orders placed.  Masato Carbin was informed that the remainder of the evaluation will be completed by another provider, this initial triage assessment does not replace that evaluation, and the importance of remaining in the ED until their evaluation is complete.  Syncope and head trauma (likely 2/2 pain from skin issue).  Abn EKG - trops added and CT head given trauma and EKG findings.   Overall well appearing and pleasant    , Gailen Shelter 12/16/21 1749

## 2021-12-16 NOTE — H&P (Signed)
History and Physical    Daniel Dunlap QMV:[id number] DOB: Nov 20, 1980 DOA: 12/16/2021  PCP: Pcp, No  Patient coming from: Home  I have personally briefly reviewed patient's old medical records in [facility]  Chief Complaint: Left lower abdominal pain, skin infection  HPI: Daniel Dunlap is a 41 y.o. male with medical history significant for HIV (RNA 561, CD4 268 on 04/17/2021) on Symtuza/Tivicay who presented to the ED for evaluation of left lower abdominal pain.  Patient noticed some swelling to his left lower abdominal area 3 days ago.  He noticed a small bump in the area concerning for folliculitis.  He tried to manipulate the area to remove the hair without success.  He has had progressive swelling/induration in his left lower abdomen with erythema.  He says this morning when he stood up he became lightheadedness and did lose consciousness.  He did hit his head just above his left eye.  He says he was out for about 30 minutes.  He felt diaphoretic prior to passing out.  He denies any chest pain, dyspnea, nausea, vomiting, rectal pain, other rash or skin changes.  ED Course  Labs/Imaging on admission: I have personally reviewed following labs and imaging studies.  Initial vitals showed BP 126/83, pulse 104, RR 18, temp 99.4 F, SPO2 95% on room air.  Tmax 99.9 F while in the ED.  Labs show WBC 11.3, hemoglobin 14.8, platelets 227,000, sodium 127, potassium 3.3, bicarb 22, BUN 16, creatinine 1.16, serum glucose 127, troponin 3, lactic acid 1.4.  Blood cultures in process.  HIV RNA and CD4 ordered and pending.  CT head without contrast negative for acute intracranial findings.  CT abdomen/pelvis with contrast shows mild circumferential thickening of the rectum with mild perirectal haziness.  No bowel obstruction.  Appendix appears normal.  Left anterior pelvic wall inflammatory changes suspicious for cellulitis noted without drainable fluid collection or abscess.  Patient was  given IV vancomycin, ceftriaxone, Flagyl, 1 L normal saline.  The hospitalist service was consulted to admit for further evaluation and management.  Review of Systems: All systems reviewed and are negative except as documented in history of present illness above.   Past Medical History:  Diagnosis Date   Genital warts    HIV infection (HCC)    HPV (human papilloma virus) infection    Thyroid disease     History reviewed. No pertinent surgical history.  Social History:  reports that he has been smoking cigarettes. He has never used smokeless tobacco. He reports current alcohol use of about 1.0 standard drink of alcohol per week. He reports that he does not use drugs.  No Known Allergies  Family History  Problem Relation Age of Onset   Healthy Mother    Healthy Father      Prior to Admission medications   Medication Sig Start Date End Date Taking? Authorizing Provider  Darunavir-Cobicistat-Emtricitabine-Tenofovir Alafenamide [facility]) [id number] MG TABS Take 1 tablet by mouth daily with breakfast. 04/17/21   Jennette Kettle, RPH-CPP  dolutegravir (TIVICAY) 50 MG tablet Take 1 tablet (50 mg total) by mouth daily. 04/17/21   Jennette Kettle, RPH-CPP    Physical Exam: Vitals:   12/16/21 2025 12/16/21 2130 12/16/21 2215 12/16/21 2228  BP: 133/85 133/85  (!) 141/85  Pulse: 83 84  73  Resp: 19 (!) 21  18  Temp: 99.9 F (37.7 C)  99.5 F (37.5 C) 99.9 F (37.7 C)  TempSrc: Oral  Oral Oral  SpO2: 100% 97%  97%  Weight:      Height:       Constitutional: Resting supine in bed, NAD, calm, comfortable Eyes: Slight bruising above left eye.  EOMI, lids and conjunctivae normal ENMT: Mucous membranes are moist. Posterior pharynx clear of any exudate or lesions.Normal dentition.  Neck: normal, supple, no masses. Respiratory: clear to auscultation bilaterally, no wheezing, no crackles. Normal respiratory effort. No accessory muscle use.  Cardiovascular: Regular rate and rhythm,  no murmurs / rubs / gallops. No extremity edema. 2+ pedal pulses. Abdomen: tenderness to left lower abdomen over indurated area of cellulitis  Musculoskeletal: no clubbing / cyanosis. No joint deformity upper and lower extremities. Good ROM, no contractures. Normal muscle tone.  Skin: Erythematous and indurated area left lower abdomen around area of folliculitis as pictured below.  Erythema better appreciated on physical exam.  No discharge. Neurologic: Sensation intact. Strength 5/5 in all 4.  Psychiatric: Normal judgment and insight. Alert and oriented x 3. Normal mood.     EKG: Personally reviewed. Sinus tachycardia, rate 103, no acute ischemic changes.  No prior for comparison.  Assessment/Plan Principal Problem:   Cellulitis of abdominal wall Active Problems:   Syncope   Hyponatremia   Hypokalemia   HIV disease (HCC)   Daniel Dunlap is a 41 y.o. male with medical history significant for HIV (RNA 561, CD4 268 on 04/17/2021) on Symtuza/Tivicay who is admitted with left lower abdominal wall cellulitis.  Assessment and Plan: * Cellulitis of abdominal wall Left lower abdominal cellulitis after initial folliculitis.  Has mild leukocytosis with WBC 11.3. -Continue IV ceftriaxone  Syncope History suggestive of orthostatic syncope.  Reports hitting his head without significant injury.  CT head negative for acute findings. -Continue IV fluid hydration -Check orthostatic vitals in the morning  Hyponatremia Mild with sodium 127.  Suspect volume depletion.  Continue IV fluids and recheck in AM.  Hypokalemia Oral supplement ordered.  HIV disease (HCC) Continue home Symtuza and Tivicay.  DVT prophylaxis: enoxaparin (LOVENOX) injection 40 mg Start: 12/17/21 1000 Code Status: Full code Family Communication: Discussed with patient, he has discussed with family Disposition Plan: From home and likely discharge to home pending clinical progress Consults called: None Severity of  Illness: The appropriate patient status for this patient is OBSERVATION. Observation status is judged to be reasonable and necessary in order to provide the required intensity of service to ensure the patient's safety. The patient's presenting symptoms, physical exam findings, and initial radiographic and laboratory data in the context of their medical condition is felt to place them at decreased risk for further clinical deterioration. Furthermore, it is anticipated that the patient will be medically stable for discharge from the hospital within 2 midnights of admission.   Daniel Mclean MD [facility]  If 7PM-7AM, please contact night-coverage [email or web address]  12/16/2021, 11:01 PM

## 2021-12-16 NOTE — Assessment & Plan Note (Signed)
Continue home Symtuza and Tivicay.

## 2021-12-16 NOTE — Assessment & Plan Note (Signed)
History suggestive of orthostatic syncope.  Reports hitting his head without significant injury.  CT head negative for acute findings. -Continue IV fluid hydration -Check orthostatic vitals in the morning

## 2021-12-16 NOTE — Assessment & Plan Note (Signed)
Oral supplement ordered. 

## 2021-12-16 NOTE — Assessment & Plan Note (Signed)
Mild with sodium 127.  Suspect volume depletion.  Continue IV fluids and recheck in AM.

## 2021-12-17 DIAGNOSIS — B181 Chronic viral hepatitis B without delta-agent: Secondary | ICD-10-CM | POA: Diagnosis present

## 2021-12-17 DIAGNOSIS — B169 Acute hepatitis B without delta-agent and without hepatic coma: Secondary | ICD-10-CM | POA: Diagnosis not present

## 2021-12-17 DIAGNOSIS — K6289 Other specified diseases of anus and rectum: Secondary | ICD-10-CM | POA: Diagnosis present

## 2021-12-17 DIAGNOSIS — E663 Overweight: Secondary | ICD-10-CM | POA: Diagnosis present

## 2021-12-17 DIAGNOSIS — E8809 Other disorders of plasma-protein metabolism, not elsewhere classified: Secondary | ICD-10-CM | POA: Diagnosis present

## 2021-12-17 DIAGNOSIS — L739 Follicular disorder, unspecified: Secondary | ICD-10-CM | POA: Diagnosis present

## 2021-12-17 DIAGNOSIS — L03311 Cellulitis of abdominal wall: Secondary | ICD-10-CM | POA: Diagnosis present

## 2021-12-17 DIAGNOSIS — D649 Anemia, unspecified: Secondary | ICD-10-CM | POA: Diagnosis present

## 2021-12-17 DIAGNOSIS — B20 Human immunodeficiency virus [HIV] disease: Secondary | ICD-10-CM

## 2021-12-17 DIAGNOSIS — R7401 Elevation of levels of liver transaminase levels: Secondary | ICD-10-CM | POA: Diagnosis not present

## 2021-12-17 DIAGNOSIS — E871 Hypo-osmolality and hyponatremia: Secondary | ICD-10-CM

## 2021-12-17 DIAGNOSIS — Z6829 Body mass index (BMI) 29.0-29.9, adult: Secondary | ICD-10-CM | POA: Diagnosis not present

## 2021-12-17 DIAGNOSIS — L0390 Cellulitis, unspecified: Secondary | ICD-10-CM | POA: Diagnosis present

## 2021-12-17 DIAGNOSIS — E869 Volume depletion, unspecified: Secondary | ICD-10-CM | POA: Diagnosis present

## 2021-12-17 DIAGNOSIS — R55 Syncope and collapse: Secondary | ICD-10-CM | POA: Diagnosis present

## 2021-12-17 DIAGNOSIS — F17210 Nicotine dependence, cigarettes, uncomplicated: Secondary | ICD-10-CM | POA: Diagnosis present

## 2021-12-17 DIAGNOSIS — K5900 Constipation, unspecified: Secondary | ICD-10-CM | POA: Diagnosis present

## 2021-12-17 DIAGNOSIS — E876 Hypokalemia: Secondary | ICD-10-CM | POA: Diagnosis present

## 2021-12-17 DIAGNOSIS — L02211 Cutaneous abscess of abdominal wall: Secondary | ICD-10-CM | POA: Diagnosis present

## 2021-12-17 DIAGNOSIS — Z79899 Other long term (current) drug therapy: Secondary | ICD-10-CM | POA: Diagnosis not present

## 2021-12-17 LAB — CBC
HCT: 40.3 % (ref 39.0–52.0)
Hemoglobin: 13.4 g/dL (ref 13.0–17.0)
MCH: 28.8 pg (ref 26.0–34.0)
MCHC: 33.3 g/dL (ref 30.0–36.0)
MCV: 86.5 fL (ref 80.0–100.0)
Platelets: 179 10*3/uL (ref 150–400)
RBC: 4.66 MIL/uL (ref 4.22–5.81)
RDW: 14.8 % (ref 11.5–15.5)
WBC: 7.2 10*3/uL (ref 4.0–10.5)
nRBC: 0 % (ref 0.0–0.2)

## 2021-12-17 LAB — BASIC METABOLIC PANEL
Anion gap: 5 (ref 5–15)
BUN: 14 mg/dL (ref 6–20)
CO2: 26 mmol/L (ref 22–32)
Calcium: 8.3 mg/dL — ABNORMAL LOW (ref 8.9–10.3)
Chloride: 100 mmol/L (ref 98–111)
Creatinine, Ser: 0.96 mg/dL (ref 0.61–1.24)
GFR, Estimated: >60 mL/min (ref >60)
Glucose, Bld: 96 mg/dL (ref 70–99)
Potassium: 3.5 mmol/L (ref 3.5–5.1)
Sodium: 131 mmol/L — ABNORMAL LOW (ref 135–145)

## 2021-12-17 LAB — T-HELPER CELLS (CD4) COUNT (NOT AT ARMC)
CD4 % Helper T Cell: 12 % — ABNORMAL LOW (ref 33–65)
CD4 T Cell Abs: 149 /uL — ABNORMAL LOW (ref 400–1790)

## 2021-12-17 LAB — MAGNESIUM: Magnesium: 1.9 mg/dL (ref 1.7–2.4)

## 2021-12-17 MED ORDER — KETOROLAC TROMETHAMINE 30 MG/ML IJ SOLN
30.0000 mg | Freq: Four times a day (QID) | INTRAMUSCULAR | Status: DC
  Administered 2021-12-17 – 2021-12-18 (×4): 30 mg via INTRAVENOUS
  Filled 2021-12-17 (×4): qty 1

## 2021-12-17 NOTE — Progress Notes (Addendum)
PROGRESS NOTE    Daniel Dunlap  DBZ:[id number] DOB: 03/24/1981 DOA: 12/16/2021 PCP: Pcp, No    Brief Narrative:  41 year old male with a history of HIV, admitted to the hospital with left lower abdominal pain.  Found to have abdominal wall cellulitis.  CT scan does not indicate any drainable fluid collection.  He has been started on IV antibiotics.   Assessment & Plan:   Principal Problem:   Cellulitis of abdominal wall Active Problems:   Syncope   Hyponatremia   Hypokalemia   HIV disease (HCC)   Cellulitis   Left lower quadrant abdominal wall cellulitis -CT imaging does not indicate any visible fluid collections that could be drained at this time -He is on ceftriaxone -Recommend continuing IV antibiotics -Continue warm compresses -Induration may coalesce to form abscess as his infection involves -May benefit from performing an ultrasound in the next 24 to 48 hours to check for developing abscess -We will also start on Toradol for anti-inflammatories  HIV disease -CD4 149 -Continue outpatient antiretrovirals  Hyponatremia -Likely related to volume depletion -Improving with IV fluids  Syncope -Related to volume depletion -Continue IV fluids   DVT prophylaxis: enoxaparin (LOVENOX) injection 40 mg Start: 12/17/21 1000  Code Status: Full code Family Communication: Discussed with patient Disposition Plan: Status is: Inpatient Remains inpatient appropriate because: Continued IV antibiotics     Consultants:    Procedures:    Antimicrobials:  Ceftriaxone   Subjective: He has not had any drainage from her abdominal cellulitis.  Reports that it is still painful.  Inquiring about incision and drainage.  Objective: Vitals:   12/17/21 0136 12/17/21 0549 12/17/21 0942 12/17/21 1411  BP: 110/73 114/70 115/70 120/85  Pulse: 65 (!) 59 64 (!) 58  Resp: 18 18 18 18   Temp: 98.5 F (36.9 C) 98.1 F (36.7 C) 99.1 F (37.3 C) 98.2 F (36.8 C)  TempSrc: Oral  Oral Oral Oral  SpO2: 97% 97% 95% 99%  Weight:      Height:        Intake/Output Summary (Last 24 hours) at 12/17/2021 1436 Last data filed at 12/17/2021 1000 Gross per 24 hour  Intake 2456.12 ml  Output 0 ml  Net 2456.12 ml   Filed Weights   12/16/21 1718  Weight: 93.3 kg    Examination:  General exam: Appears calm and comfortable  Respiratory system: Clear to auscultation. Respiratory effort normal. Cardiovascular system: S1 & S2 heard, RRR. No JVD, murmurs, rubs, gallops or clicks. No pedal edema. Gastrointestinal system: Abdomen is nondistended, soft and nontender. No organomegaly or masses felt. Normal bowel sounds heard. Large area of induration over anterior LLQ, without any clear fluctuance. This area is tender, warm and red. Central nervous system: Alert and oriented. No focal neurological deficits. Extremities: Symmetric 5 x 5 power. Skin: No rashes, lesions or ulcers Psychiatry: Judgement and insight appear normal. Mood & affect appropriate.     Data Reviewed: I have personally reviewed following labs and imaging studies  CBC: Recent Labs  Lab 12/16/21 1743 12/17/21 0437  WBC 11.3* 7.2  HGB 14.8 13.4  HCT 43.7 40.3  MCV 83.7 86.5  PLT 227 179   Basic Metabolic Panel: Recent Labs  Lab 12/16/21 1743 12/17/21 0437  NA 127* 131*  K 3.3* 3.5  CL 95* 100  CO2 22 26  GLUCOSE 127* 96  BUN 16 14  CREATININE 1.16 0.96  CALCIUM 9.0 8.3*  MG  --  1.9   GFR: Estimated Creatinine Clearance: 116.2 mL/min (  by C-G formula based on SCr of 0.96 mg/dL). Liver Function Tests: No results for input(s): "AST", "ALT", "ALKPHOS", "BILITOT", "PROT", "ALBUMIN" in the last 168 hours. No results for input(s): "LIPASE", "AMYLASE" in the last 168 hours. No results for input(s): "AMMONIA" in the last 168 hours. Coagulation Profile: No results for input(s): "INR", "PROTIME" in the last 168 hours. Cardiac Enzymes: No results for input(s): "CKTOTAL", "CKMB", "CKMBINDEX",  "TROPONINI" in the last 168 hours. BNP (last 3 results) No results for input(s): "PROBNP" in the last 8760 hours. HbA1C: No results for input(s): "HGBA1C" in the last 72 hours. CBG: Recent Labs  Lab 12/16/21 1802  GLUCAP 126*   Lipid Profile: No results for input(s): "CHOL", "HDL", "LDLCALC", "TRIG", "CHOLHDL", "LDLDIRECT" in the last 72 hours. Thyroid Function Tests: No results for input(s): "TSH", "T4TOTAL", "FREET4", "T3FREE", "THYROIDAB" in the last 72 hours. Anemia Panel: No results for input(s): "VITAMINB12", "FOLATE", "FERRITIN", "TIBC", "IRON", "RETICCTPCT" in the last 72 hours. Sepsis Labs: Recent Labs  Lab 12/16/21 2025  LATICACIDVEN 1.4    Recent Results (from the past 240 hour(s))  Blood culture (routine x 2)     Status: None (Preliminary result)   Collection Time: 12/16/21  9:51 PM   Specimen: BLOOD  Result Value Ref Range Status   Specimen Description   Final    BLOOD RIGHT ANTECUBITAL Performed at [facility], [address], [place], [place] [address]    Special Requests   Final    BOTTLES DRAWN AEROBIC AND ANAEROBIC Blood Culture adequate volume Performed at [facility], [address], [place], [place] [address]    Culture   Final    NO GROWTH < 12 HOURS Performed at [facility], [address], [place], [place] [address]    Report Status PENDING  Incomplete  Blood culture (routine x 2)     Status: None (Preliminary result)   Collection Time: 12/16/21  9:52 PM   Specimen: BLOOD  Result Value Ref Range Status   Specimen Description   Final    BLOOD BLOOD RIGHT FOREARM Performed at [facility], [address], [place], [place] [address]    Special Requests   Final    BOTTLES DRAWN AEROBIC AND ANAEROBIC Blood Culture adequate volume Performed at [facility], [address] [place], [place] [address]    Culture   Final    NO GROWTH < 12 HOURS Performed at [facility], [address], [place], [place] [address]    Report Status PENDING  Incomplete         Radiology Studies: CT ABDOMEN PELVIS W CONTRAST  Result Date: 12/16/2021 CLINICAL DATA:  Left lower quadrant abdominal pain. EXAM: CT ABDOMEN AND PELVIS WITH CONTRAST TECHNIQUE: Multidetector CT imaging of the abdomen and pelvis was performed using the standard protocol following bolus administration of intravenous contrast. RADIATION DOSE REDUCTION: This exam was performed according to the departmental dose-optimization program which includes automated exposure control, adjustment of the mA and/or kV according to patient size and/or use of iterative reconstruction technique. CONTRAST:  OMNIPAQUE IOHEXOL 300 MG/ML  SOLN COMPARISON:  None Available. FINDINGS: Lower chest: The visualized lung bases are clear. No intra-abdominal free air or free fluid. Hepatobiliary: Mild fatty liver. No biliary dilatation. The gallbladder is unremarkable. Pancreas: Unremarkable. No pancreatic ductal dilatation or surrounding inflammatory changes. Spleen: Normal in size without focal abnormality. Adrenals/Urinary Tract: The adrenal glands unremarkable. The kidneys, visualized ureters, and urinary  bladder appear unremarkable. Stomach/Bowel: There is mild circumferential thickening of the rectum with mild perirectal haziness. Clinical correlation is recommended to evaluate for possibility of proctitis. There is no bowel obstruction. The appendix is normal. Vascular/Lymphatic: The abdominal aorta and IVC unremarkable. No portal venous gas. Left external iliac chain adenopathy measures 19 mm, likely reactive. Reproductive: The prostate and seminal vesicles are grossly unremarkable. No pelvic mass. Other: Inflammatory changes of the skin and subcutaneous soft tissues of the left anterior pelvic wall suspicious for cellulitis. No drainable fluid collection or abscess. No soft tissue gas. Musculoskeletal: No acute or  significant osseous findings. IMPRESSION: 1. Mild circumferential thickening of the rectum with mild perirectal haziness. Clinical correlation is recommended to evaluate for possibility of proctitis. 2. No bowel obstruction. Normal appendix. 3. Inflammatory changes of the skin and subcutaneous soft tissues of the left anterior pelvic wall suspicious for cellulitis. No drainable fluid collection or abscess. Electronically Signed   By: Elgie Collard M.D.   On: 12/16/2021 20:57   CT HEAD WO CONTRAST ( )  Result Date: 12/16/2021 CLINICAL DATA:  Trauma EXAM: CT HEAD WITHOUT CONTRAST TECHNIQUE: Contiguous axial images were obtained from the base of the skull through the vertex without intravenous contrast. RADIATION DOSE REDUCTION: This exam was performed according to the departmental dose-optimization program which includes automated exposure control, adjustment of the mA and/or kV according to patient size and/or use of iterative reconstruction technique. COMPARISON:  None Available. FINDINGS: Brain: No acute intracranial findings are seen. There are no signs of bleeding within the cranium. Ventricles are not dilated. There is no focal edema or mass effect. Vascular: Unremarkable. Skull: No fracture is seen in calvarium. Sinuses/Orbits: Unremarkable. Other: None. IMPRESSION: No acute intracranial findings are seen in noncontrast CT brain. Electronically Signed   By: Ernie Avena M.D.   On: 12/16/2021 17:59        Scheduled Meds:  Darunavir-Cobicistat-Emtricitabine-Tenofovir Alafenamide  1 tablet Oral Q breakfast   dolutegravir  50 mg Oral Daily   enoxaparin (LOVENOX) injection  40 mg Subcutaneous Q24H   ketorolac  30 mg Intravenous Q6H   Continuous Infusions:  cefTRIAXone (ROCEPHIN)  IV       LOS: 0 days    Time spent:    Erick Blinks, MD [facility]   If 7PM-7AM, please contact night-coverage [email or web address]  12/17/2021, 2:36 PM

## 2021-12-17 NOTE — Progress Notes (Signed)
1530 Warm compress applied to patient's left abdomen per MD order. 1830 Patient reports that this has helped his his abdomen feel better. Haydee Salter, RN 12/17/21 6:40 PM

## 2021-12-17 NOTE — Plan of Care (Signed)
Problem: Education: Goal: Knowledge of General Education information will improve Description: Including pain rating scale, medication(s)/side effects and non-pharmacologic comfort measures Outcome: Progressing   Problem: Clinical Measurements: Goal: Ability to maintain clinical measurements within normal limits will improve Outcome: Progressing   Problem: Coping: Goal: Level of anxiety will decrease Outcome: Progressing   Haydee Salter, RN 12/17/21 8:11 PM

## 2021-12-18 ENCOUNTER — Inpatient Hospital Stay (HOSPITAL_COMMUNITY): Payer: 59

## 2021-12-18 ENCOUNTER — Encounter (HOSPITAL_COMMUNITY): Payer: Self-pay | Admitting: Internal Medicine

## 2021-12-18 DIAGNOSIS — R55 Syncope and collapse: Secondary | ICD-10-CM

## 2021-12-18 DIAGNOSIS — E876 Hypokalemia: Secondary | ICD-10-CM

## 2021-12-18 LAB — BASIC METABOLIC PANEL
Anion gap: 6 (ref 5–15)
BUN: 19 mg/dL (ref 6–20)
CO2: 27 mmol/L (ref 22–32)
Calcium: 8.3 mg/dL — ABNORMAL LOW (ref 8.9–10.3)
Chloride: 100 mmol/L (ref 98–111)
Creatinine, Ser: 1.11 mg/dL (ref 0.61–1.24)
GFR, Estimated: >60 mL/min (ref >60)
Glucose, Bld: 98 mg/dL (ref 70–99)
Potassium: 3.4 mmol/L — ABNORMAL LOW (ref 3.5–5.1)
Sodium: 133 mmol/L — ABNORMAL LOW (ref 135–145)

## 2021-12-18 LAB — HEPATIC FUNCTION PANEL
ALT: 192 U/L — ABNORMAL HIGH (ref 0–44)
AST: 160 U/L — ABNORMAL HIGH (ref 15–41)
Albumin: 2.9 g/dL — ABNORMAL LOW (ref 3.5–5.0)
Alkaline Phosphatase: 74 U/L (ref 38–126)
Bilirubin, Direct: 0.3 mg/dL — ABNORMAL HIGH (ref 0.0–0.2)
Indirect Bilirubin: 0.5 mg/dL (ref 0.3–0.9)
Total Bilirubin: 0.8 mg/dL (ref 0.3–1.2)
Total Protein: 7.7 g/dL (ref 6.5–8.1)

## 2021-12-18 LAB — CBC
HCT: 40 % (ref 39.0–52.0)
Hemoglobin: 13.1 g/dL (ref 13.0–17.0)
MCH: 28.2 pg (ref 26.0–34.0)
MCHC: 32.8 g/dL (ref 30.0–36.0)
MCV: 86.2 fL (ref 80.0–100.0)
Platelets: 176 10*3/uL (ref 150–400)
RBC: 4.64 MIL/uL (ref 4.22–5.81)
RDW: 14.5 % (ref 11.5–15.5)
WBC: 5 10*3/uL (ref 4.0–10.5)
nRBC: 0 % (ref 0.0–0.2)

## 2021-12-18 LAB — PHOSPHORUS: Phosphorus: 4.1 mg/dL (ref 2.5–4.6)

## 2021-12-18 LAB — MAGNESIUM: Magnesium: 2.2 mg/dL (ref 1.7–2.4)

## 2021-12-18 MED ORDER — KETOROLAC TROMETHAMINE 30 MG/ML IJ SOLN: 30.0000 mg | Freq: Four times a day (QID) | INTRAMUSCULAR | Status: DC | PRN

## 2021-12-18 MED ORDER — METRONIDAZOLE 500 MG/100ML IV SOLN
500.0000 mg | Freq: Two times a day (BID) | INTRAVENOUS | Status: DC
  Administered 2021-12-18 – 2021-12-20 (×4): 500 mg via INTRAVENOUS
  Filled 2021-12-18 (×4): qty 100

## 2021-12-18 MED ORDER — VANCOMYCIN HCL 1250 MG/250ML IV SOLN
1250.0000 mg | Freq: Two times a day (BID) | INTRAVENOUS | Status: DC
  Administered 2021-12-18 – 2021-12-20 (×4): 1250 mg via INTRAVENOUS
  Filled 2021-12-18 (×4): qty 250

## 2021-12-18 MED ORDER — POTASSIUM CHLORIDE CRYS ER 20 MEQ PO TBCR
40.0000 meq | EXTENDED_RELEASE_TABLET | Freq: Two times a day (BID) | ORAL | Status: AC
Start: 2021-12-18 — End: 2021-12-18
  Administered 2021-12-18 (×2): 40 meq via ORAL
  Filled 2021-12-18 (×2): qty 2

## 2021-12-18 MED ORDER — SODIUM CHLORIDE 0.9 % IV SOLN
2.0000 g | Freq: Three times a day (TID) | INTRAVENOUS | Status: DC
  Administered 2021-12-18 – 2021-12-20 (×6): 2 g via INTRAVENOUS
  Filled 2021-12-18 (×7): qty 12.5

## 2021-12-18 MED ORDER — LIDOCAINE HCL 1 % IJ SOLN
10.0000 mL | Freq: Once | INTRAMUSCULAR | Status: AC
  Administered 2021-12-18: 10 mL via INTRADERMAL
  Filled 2021-12-18: qty 10

## 2021-12-18 MED ORDER — SODIUM CHLORIDE 0.9 % IV SOLN: INTRAVENOUS | Status: DC

## 2021-12-18 NOTE — Procedures (Signed)
Incision and Drainage Procedure Note  Pre-operative Diagnosis: abdominal wall cellulitis with abscess  Post-operative Diagnosis: same  Indications: abscess  Anesthesia: 1% plain lidocaine  Procedure Details  The procedure, risks and complications have been discussed in detail with the patient, and the patient has signed consent to the procedure.  The skin was sterilely prepped and draped over the affected area in the usual fashion. After adequate local anesthesia, I&D with a #11 blade was performed on the left lower abdominal wall. Purulent drainage: present, copious amount. Hemostats were used to break up loculations. The wound was irrigated with normal saline. 1/4" iodoform packing was placed in abscess cavity. The patient was observed until stable.  Findings: Purulent drainage  EBL: 5 cc's  Drains/Packing: iodoform 1/4"  Condition: Tolerated procedure well   Complications: none.  Eric Form, PA-C

## 2021-12-18 NOTE — Progress Notes (Signed)
PROGRESS NOTE    Daniel Dunlap  BS:[id number] DOB: 1980/08/05 DOA: 12/16/2021 PCP: Pcp, No   Brief Narrative:  Daniel Dunlap is a 41 y.o. male with medical history significant for HIV (RNA 561, CD4 268 on 04/17/2021) on Symtuza/Tivicay who is admitted with left lower abdominal wall pain with concern for cellulitis and induration.  He noticed some swelling to the left lower abdomen approximately 3 to 4 days ago and he noticed a small bump in the area concerning for folliculitis.  He tried to manipulate the area and remove the hair without success and had progressive swelling and induration of his left lower abdomen with erythema.  On the day prior to admission on the morning morning he became lightheaded when he stood up and did lose consciousness.  He did hit his head just above his left eye and states he was out for about 30 minutes and felt diaphoretic prior to passing out.  He denies chest pain or shortness of breath.  When presenting to the ED he was noted to have elevated WBC and a Tmax of 99.9.  Blood cultures were obtained and a HIV RNA and CD4 ordered and a head CT scan was done which was negative for any acute intracranial findings.  CT of the abdomen pelvis was done with contrast that showed mild circumferential thickening of the rectum with mild perirectal haziness but no bowel obstruction and the appendix appeared normal.  There was left anterior pelvic wall inflammatory changes suspicious for cellulitis noted without drainable abscess or fluid collection.  He was given IV vancomycin, ceftriaxone and Flagyl and the hospitalist team was consulted.  His antibiotics were de-escalated to IV ceftriaxone and given slow improvement General surgery was consulted for further evaluation of his induration and cellulitis.  Assessment and Plan:  * Cellulitis of abdominal wall with possible abscess and induration -Left lower abdominal cellulitis after initial folliculitis.  -Has mild leukocytosis with  WBC 11.3 which is now improved to 5.0 -Continue IV ceftriaxone 1 g every 24 hours -Continue with pain control and supportive care -Currently on IV ketorolac 30 mg every 6 scheduled for 2 days but will discontinue this and change to as needed -Continue with IV morphine 1 mg every 3 as needed for severe pain along with oxycodone IR 5 mg every 4 as needed for moderate pain -Continue with supportive care and antiemetics with ondansetron 4 mg p.o./IV every 6 as needed for nausea   Syncope -History suggestive of orthostatic syncope.   -Reports hitting his head without significant injury.  -CT head negative for acute findings. -Continued IV fluid hydration and will resume IV fluids at 75 MLS per hour with normal saline -Check orthostatic vitals in the morning -Consider PT OT evaluation  Hyponatremia -Mild with sodium 127 on admission.  Suspect volume depletion -Improved with IV fluid hydration.  Sodium is now gone from 127 is now 133 -Continue to monitor and trend and repeat CMP in a.m.  Abnormal LFTs -Unclear etiology -AST has trended up to 160 and ALT was 192 -Check right upper quadrant ultrasound and acute hepatitis panel in the a.m. -Continue monitor and trend liver function carefully and repeat CMP in the a.m.  Hypoalbuminemia -Patient's albumin level is now 2.9 -Continue to monitor and trend and repeat CMP in the a.m.  Hypokalemia -Potassium was 3.4 -Replete with po Kcl 40 mEQ BID x2 -Check Mag Level in the AM  -Continue to Monitor and Trend and Replete as Necessary -Repeat CMP in the AM  HIV disease ([place]) -CD4 was 149 -Continue home Antivirals with Symtuza and Dolutegravir 50 mg p.o. daily  Overweight -Complicates overall prognosis and care -Estimated body mass index is 29.51 kg/m as calculated from the following:   Height as of this encounter: 5\' 10"  (1.778 m).   Weight as of this encounter: 93.3 kg.  -Weight Loss and Dietary Counseling given  DVT prophylaxis:  enoxaparin (LOVENOX) injection 40 mg Start: 12/17/21 1000    Code Status: Full Code Family Communication: No family currently at bedside  Disposition Plan:  Level of care: Med-Surg Status is: Inpatient Remains inpatient appropriate because: Has lower left quadrant swelling and induration is slightly painful   Consultants:  General surgery  Procedures:  None  Antimicrobials:  Anti-infectives (From admission, onward)    Start     Dose/Rate Route Frequency Ordered Stop   12/17/21 2200  cefTRIAXone (ROCEPHIN) 1 g in sodium chloride 0.9 % 100 mL IVPB        1 g 200 mL/hr over 30 Minutes Intravenous Every 24 hours 12/16/21 2257 12/24/21 2159   12/17/21 1000  dolutegravir (TIVICAY) tablet 50 mg        50 mg Oral Daily 12/16/21 2301     12/17/21 0800  Darunavir-Cobicistat-Emtricitabine-Tenofovir Alafenamide (SYMTUZA) 800-150-200-10 MG TABS 1 tablet        1 tablet Oral Daily with breakfast 12/16/21 2301     12/16/21 2115  vancomycin (VANCOREADY) IVPB 1750 mg/350 mL        1,750 mg 175 mL/hr over 120 Minutes Intravenous  Once 12/16/21 2100 12/17/21 0708   12/16/21 2115  cefTRIAXone (ROCEPHIN) 2 g in sodium chloride 0.9 % 100 mL IVPB        2 g 200 mL/hr over 30 Minutes Intravenous  Once 12/16/21 2110 12/16/21 2215   12/16/21 2115  metroNIDAZOLE (FLAGYL) IVPB 500 mg        500 mg 100 mL/hr over 60 Minutes Intravenous  Once 12/16/21 2110 12/17/21 0708       Subjective: Seen and examined at bedside and thinks he is doing little bit better today.  States his abdomen is not hurting as bad.  No nausea or vomiting.  Still had some discomfort on palpation of the "lower quadrant the indurated area.  Denies any chest pain or shortness of breath.  No other concerns or complaints at this time.  Objective: Vitals:   12/17/21 0942 12/17/21 1411 12/17/21 2155 12/18/21 0605  BP: 115/70 120/85 115/83 100/69  Pulse: 64 (!) 58 61 (!) 55  Resp: 18 18 18 18   Temp: 99.1 F (37.3 C) 98.2 F (36.8  C) 98.9 F (37.2 C) 97.7 F (36.5 C)  TempSrc: Oral Oral Oral Oral  SpO2: 95% 99% 95% 98%  Weight:      Height:        Intake/Output Summary (Last 24 hours) at 12/18/2021 0846 Last data filed at 12/18/2021 0600 Gross per 24 hour  Intake 1800 ml  Output 0 ml  Net 1800 ml   Filed Weights   12/16/21 1718  Weight: 93.3 kg   Examination: Physical Exam:  Constitutional: WN/WD overweight African-American male currently no acute distress Respiratory: Diminished to auscultation bilaterally with coarse breath sounds, no wheezing, rales, rhonchi or crackles. Normal respiratory effort and patient is not tachypenic. No accessory muscle use.  Unlabored breathing and not wearing supplemental oxygen nasal cannula Cardiovascular: RRR, no murmurs / rubs / gallops. S1 and S2 auscultated. No extremity edema. Abdomen: Soft, tender to palpate in  the left lower quadrant, distended secondary to body habitus. N has some slight induration and redness and erythema as well as some fluctuance on the left lower quadrant.  Bowel sounds positive.  GU: Deferred. Musculoskeletal: No clubbing / cyanosis of digits/nails. No joint deformity upper and lower extremities.  Skin: No rashes, lesions, ulcers on limited skin evaluation. No induration; Warm and dry.  Neurologic: CN 2-12 grossly intact with no focal deficits. Romberg sign and cerebellar reflexes not assessed.  Psychiatric: Normal judgment and insight. Alert and oriented x 3. Normal mood and appropriate affect.   Data Reviewed: I have personally reviewed following labs and imaging studies  CBC: Recent Labs  Lab 12/16/21 1743 12/17/21 0437 12/18/21 0504  WBC 11.3* 7.2 5.0  HGB 14.8 13.4 13.1  HCT 43.7 40.3 40.0  MCV 83.7 86.5 86.2  PLT 227 179 0000000   Basic Metabolic Panel: Recent Labs  Lab 12/16/21 1743 12/17/21 0437 12/18/21 0504  NA 127* 131* 133*  K 3.3* 3.5 3.4*  CL 95* 100 100  CO2 22 26 27   GLUCOSE 127* 96 98  BUN 16 14 19   CREATININE  1.16 0.96 1.11  CALCIUM 9.0 8.3* 8.3*  MG  --  1.9  --    GFR: Estimated Creatinine Clearance: 100.5 mL/min (by C-G formula based on SCr of 1.11 mg/dL). Liver Function Tests: No results for input(s): "AST", "ALT", "ALKPHOS", "BILITOT", "PROT", "ALBUMIN" in the last 168 hours. No results for input(s): "LIPASE", "AMYLASE" in the last 168 hours. No results for input(s): "AMMONIA" in the last 168 hours. Coagulation Profile: No results for input(s): "INR", "PROTIME" in the last 168 hours. Cardiac Enzymes: No results for input(s): "CKTOTAL", "CKMB", "CKMBINDEX", "TROPONINI" in the last 168 hours. BNP (last 3 results) No results for input(s): "PROBNP" in the last 8760 hours. HbA1C: No results for input(s): "HGBA1C" in the last 72 hours. CBG: Recent Labs  Lab 12/16/21 1802  GLUCAP 126*   Lipid Profile: No results for input(s): "CHOL", "HDL", "LDLCALC", "TRIG", "CHOLHDL", "LDLDIRECT" in the last 72 hours. Thyroid Function Tests: No results for input(s): "TSH", "T4TOTAL", "FREET4", "T3FREE", "THYROIDAB" in the last 72 hours. Anemia Panel: No results for input(s): "VITAMINB12", "FOLATE", "FERRITIN", "TIBC", "IRON", "RETICCTPCT" in the last 72 hours. Sepsis Labs: Recent Labs  Lab 12/16/21 2025  LATICACIDVEN 1.4    Recent Results (from the past 240 hour(s))  Blood culture (routine x 2)     Status: None (Preliminary result)   Collection Time: 12/16/21  9:51 PM   Specimen: BLOOD  Result Value Ref Range Status   Specimen Description   Final    BLOOD RIGHT ANTECUBITAL Performed at [facility] [place] [address] [place], [place] [address]    Special Requests   Final    BOTTLES DRAWN AEROBIC AND ANAEROBIC Blood Culture adequate volume Performed at [facility] [address] [place], [place] [address]    Culture   Final    NO GROWTH 2 DAYS Performed at [facility] [address] [place], [place] [address]    Report Status PENDING   Incomplete  Blood culture (routine x 2)     Status: None (Preliminary result)   Collection Time: 12/16/21  9:52 PM   Specimen: BLOOD  Result Value Ref Range Status   Specimen Description   Final    BLOOD BLOOD RIGHT FOREARM Performed at [facility] [address] [place], [place] [address]    Special Requests   Final  BOTTLES DRAWN AEROBIC AND ANAEROBIC Blood Culture adequate volume Performed at [facility], [address] [place], [place] [address]    Culture   Final    NO GROWTH 2 DAYS Performed at [facility], [address] [place], [place] [address]    Report Status PENDING  Incomplete     Radiology Studies: CT ABDOMEN PELVIS W CONTRAST  Result Date: 12/16/2021 CLINICAL DATA:  Left lower quadrant abdominal pain. EXAM: CT ABDOMEN AND PELVIS WITH CONTRAST TECHNIQUE: Multidetector CT imaging of the abdomen and pelvis was performed using the standard protocol following bolus administration of intravenous contrast. RADIATION DOSE REDUCTION: This exam was performed according to the departmental dose-optimization program which includes automated exposure control, adjustment of the mA and/or kV according to patient size and/or use of iterative reconstruction technique. CONTRAST:  OMNIPAQUE IOHEXOL 300 MG/ML  SOLN COMPARISON:  None Available. FINDINGS: Lower chest: The visualized lung bases are clear. No intra-abdominal free air or free fluid. Hepatobiliary: Mild fatty liver. No biliary dilatation. The gallbladder is unremarkable. Pancreas: Unremarkable. No pancreatic ductal dilatation or surrounding inflammatory changes. Spleen: Normal in size without focal abnormality. Adrenals/Urinary Tract: The adrenal glands unremarkable. The kidneys, visualized ureters, and urinary bladder appear unremarkable. Stomach/Bowel: There is mild circumferential thickening of the rectum with mild perirectal haziness. Clinical correlation is recommended to  evaluate for possibility of proctitis. There is no bowel obstruction. The appendix is normal. Vascular/Lymphatic: The abdominal aorta and IVC unremarkable. No portal venous gas. Left external iliac chain adenopathy measures 19 mm, likely reactive. Reproductive: The prostate and seminal vesicles are grossly unremarkable. No pelvic mass. Other: Inflammatory changes of the skin and subcutaneous soft tissues of the left anterior pelvic wall suspicious for cellulitis. No drainable fluid collection or abscess. No soft tissue gas. Musculoskeletal: No acute or significant osseous findings. IMPRESSION: 1. Mild circumferential thickening of the rectum with mild perirectal haziness. Clinical correlation is recommended to evaluate for possibility of proctitis. 2. No bowel obstruction. Normal appendix. 3. Inflammatory changes of the skin and subcutaneous soft tissues of the left anterior pelvic wall suspicious for cellulitis. No drainable fluid collection or abscess. Electronically Signed   By: Elgie Collard M.D.   On: 12/16/2021 20:57   CT HEAD WO CONTRAST ( )  Result Date: 12/16/2021 CLINICAL DATA:  Trauma EXAM: CT HEAD WITHOUT CONTRAST TECHNIQUE: Contiguous axial images were obtained from the base of the skull through the vertex without intravenous contrast. RADIATION DOSE REDUCTION: This exam was performed according to the departmental dose-optimization program which includes automated exposure control, adjustment of the mA and/or kV according to patient size and/or use of iterative reconstruction technique. COMPARISON:  None Available. FINDINGS: Brain: No acute intracranial findings are seen. There are no signs of bleeding within the cranium. Ventricles are not dilated. There is no focal edema or mass effect. Vascular: Unremarkable. Skull: No fracture is seen in calvarium. Sinuses/Orbits: Unremarkable. Other: None. IMPRESSION: No acute intracranial findings are seen in noncontrast CT brain. Electronically Signed    By: Ernie Avena M.D.   On: 12/16/2021 17:59     Scheduled Meds:  Darunavir-Cobicistat-Emtricitabine-Tenofovir Alafenamide  1 tablet Oral Q breakfast   dolutegravir  50 mg Oral Daily   enoxaparin (LOVENOX) injection  40 mg Subcutaneous Q24H   ketorolac  30 mg Intravenous Q6H   potassium chloride  40 mEq Oral BID   Continuous Infusions:  cefTRIAXone (ROCEPHIN)  IV 1 g (12/17/21 2212)    LOS: 1 day   Marguerita Merles,  DO [facility] Available via Epic secure chat 7am-7pm After these hours, please refer to coverage provider listed on [email or web address] 12/18/2021, 8:46 AM

## 2021-12-18 NOTE — TOC Progression Note (Signed)
Transition of Care [facility]) - Progression Note    Patient Details  Name: Maximo Spratling MRN: [id number] Date of Birth: Jan 09, 1981  Transition of Care [facility]) CM/SW Contact  Geni Bers, RN Phone Number: 12/18/2021, 11:07 AM  Clinical Narrative:      Transition of Care (TOC) Screening Note   Patient Details  Name: Connie Hilgert Date of Birth: June 13, 1980   Transition of [facility]) CM/SW Contact:    Geni Bers, RN Phone Number: 12/18/2021, 11:07 AM    Transition of Care Department (TOC) has reviewed patient and no TOC needs have been identified at this time. We will continue to monitor patient advancement through interdisciplinary progression rounds. If new patient transition needs arise, please place a TOC consult.         Expected Discharge Plan and Services                                                 Social Determinants of Health (SDOH) Interventions    Readmission Risk Interventions     No data to display

## 2021-12-18 NOTE — Consult Note (Signed)
Consult Note  Daniel Dunlap 03/30/1981  [id number].    Requesting MD: Dr. Alfredia Ferguson Chief Complaint/Reason for Consult: Abdominal wall cellulitis, abscess?  HPI:  41 y.o. male with medical history significant for HIV on Symtuza/Tivicay who presented to [facility] ED with left lower abdominal wall pain and syncopal episode. He was admitted to the medicine service. He states that symptoms of redness and small bump began Friday and worsened since. On the day prior to admission he became light headed transitioning from sitting to standing and had loss of consciousness. He hit his head. Since admission he has had CT scans of head and abdomen. CT head negative for intracranial findings. CT abdomen shows Inflammatory changes of the skin and subcutaneous soft tissues of the left anterior pelvic wall suspicious for cellulitis. No drainable fluid collection or abscess.  He states pain is somewhat better but still has induration of lower abdomen. He thinks he has had an incision and drainage of an abscess in the past but is unsure of when or where. He states he has been taking his HIV medications as prescribed  On exam there appears to be an area of fluctuance and general surgery consulted for incision and drainage  Substance use: none Allergies: nkda Blood thinners: none  ROS: Review of Systems  Constitutional:  Negative for chills and fever.  Respiratory:  Negative for cough and shortness of breath.   Cardiovascular:  Negative for chest pain and palpitations.  Gastrointestinal:  Negative for abdominal pain, nausea and vomiting.    Family History  Problem Relation Age of Onset   Healthy Mother    Healthy Father     Past Medical History:  Diagnosis Date   Genital warts    HIV infection ([place])    HPV (human papilloma virus) infection    Thyroid disease     History reviewed. No pertinent surgical history.  Social History:  reports that he has been smoking cigarettes. He has never used  smokeless tobacco. He reports current alcohol use of about 1.0 standard drink of alcohol per week. He reports that he does not use drugs.  Allergies: No Known Allergies  Medications Prior to Admission  Medication Sig Dispense Refill   Darunavir-Cobicistat-Emtricitabine-Tenofovir Alafenamide (SYMTUZA) 800-150-200-10 MG TABS Take 1 tablet by mouth daily with breakfast. 30 tablet 3   dolutegravir (TIVICAY) 50 MG tablet Take 1 tablet (50 mg total) by mouth daily. 30 tablet 3    Blood pressure 100/69, pulse (!) 55, temperature 97.7 F (36.5 C), temperature source Oral, resp. rate 18, height 5\' 10"  (1.778 m), weight 93.3 kg, SpO2 98 %. Physical Exam: General: pleasant, WD, male who is laying in bed in NAD HEENT: head is normocephalic, atraumatic.  Sclera are noninjected.  Pupils equal and round. EOMs intact.  Ears and nose without any masses or lesions.  Mouth is pink and moist Lungs: Respiratory effort nonlabored Abd: soft, NT, ND, +BS, no masses, hernias, or organomegaly MSK: all 4 extremities are symmetrical with no cyanosis, clubbing, or edema. Skin: warm and dry. Left lower abdminal wall with erythema and induration surrounding area of fluctuance approximately 1x1 cm in size. No bleeding or drainage Neuro: Cranial nerves 2-12 grossly intact, sensation is normal throughout Psych: A&Ox3 with an appropriate affect.    Results for orders placed or performed during the hospital encounter of 12/16/21 (from the past 48 hour(s))  Basic metabolic panel     Status: Abnormal   Collection Time: 12/16/21  5:43 PM  Result  Value Ref Range   Sodium 127 (L) 135 - 145 mmol/L   Potassium 3.3 (L) 3.5 - 5.1 mmol/L   Chloride 95 (L) 98 - 111 mmol/L   CO2 22 22 - 32 mmol/L   Glucose, Bld 127 (H) 70 - 99 mg/dL    Comment: Glucose reference range applies only to samples taken after fasting for at least 8 hours.   BUN 16 6 - 20 mg/dL   Creatinine, Ser 7.16 0.61 - 1.24 mg/dL   Calcium 9.0 8.9 - 96.7 mg/dL    GFR, Estimated >89 >38 mL/min    Comment: (NOTE) Calculated using the CKD-EPI Creatinine Equation (2021)    Anion gap 10 5 - 15    Comment: Performed at [facility], [address] [place], [place] [address]  CBC     Status: Abnormal   Collection Time: 12/16/21  5:43 PM  Result Value Ref Range   WBC 11.3 (H) 4.0 - 10.5 K/uL   RBC 5.22 4.22 - 5.81 MIL/uL   Hemoglobin 14.8 13.0 - 17.0 g/dL   HCT 10.2 58.5 - 27.7 %   MCV 83.7 80.0 - 100.0 fL   MCH 28.4 26.0 - 34.0 pg   MCHC 33.9 30.0 - 36.0 g/dL   RDW 82.4 23.5 - 36.1 %   Platelets 227 150 - 400 K/uL   nRBC 0.0 0.0 - 0.2 %    Comment: Performed at [facility], [address], [place], [place] [address]  Troponin I (High Sensitivity)     Status: None   Collection Time: 12/16/21  5:43 PM  Result Value Ref Range   Troponin I (High Sensitivity) 3 <18 ng/L    Comment: (NOTE) Elevated high sensitivity troponin I (hsTnI) values and significant  changes across serial measurements may suggest ACS but many other  chronic and acute conditions are known to elevate hsTnI results.  Refer to the "Links" section for chest pain algorithms and additional  guidance. Performed at [facility], [address] [place], [place] [address]   Urinalysis, Routine w reflex microscopic Urine, Clean Catch     Status: Abnormal   Collection Time: 12/16/21  5:49 PM  Result Value Ref Range   Color, Urine AMBER (A) YELLOW    Comment: BIOCHEMICALS MAY BE AFFECTED BY COLOR   APPearance CLEAR CLEAR   Specific Gravity, Urine 1.028 1.005 - 1.030   pH 5.0 5.0 - 8.0   Glucose, UA NEGATIVE NEGATIVE mg/dL   Hgb urine dipstick SMALL (A) NEGATIVE   Bilirubin Urine NEGATIVE NEGATIVE   Ketones, ur NEGATIVE NEGATIVE mg/dL   Protein, ur 30 (A) NEGATIVE mg/dL   Nitrite NEGATIVE NEGATIVE   Leukocytes,Ua NEGATIVE NEGATIVE   RBC / HPF 0-5 0 - 5 RBC/hpf   WBC, UA 0-5 0 - 5 WBC/hpf   Bacteria, UA RARE (A) NONE SEEN    Mucus PRESENT    Hyaline Casts, UA PRESENT     Comment: Performed at [facility], [address], [place], [place] [address]  CBG monitoring, ED     Status: Abnormal   Collection Time: 12/16/21  6:02 PM  Result Value Ref Range   Glucose-Capillary 126 (H) 70 - 99 mg/dL    Comment: Glucose reference range applies only to samples taken after fasting for at least 8 hours.  Lactic acid, plasma     Status: None   Collection Time: 12/16/21  8:25 PM  Result Value Ref Range   Lactic  Acid, Venous 1.4 0.5 - 1.9 mmol/L    Comment: Performed at [facility], [place] [address] [place], [place] [address]  T-helper cells (CD4) count (not at [facility])     Status: Abnormal   Collection Time: 12/16/21  8:25 PM  Result Value Ref Range   CD4 T Cell Abs 149 (L) 400 - 1,790 /uL   CD4 % Helper T Cell 12 (L) 33 - 65 %    Comment: Performed at [facility], [place] [address] [place], [place] [address]  Blood culture (routine x 2)     Status: None (Preliminary result)   Collection Time: 12/16/21  9:51 PM   Specimen: BLOOD  Result Value Ref Range   Specimen Description      BLOOD RIGHT ANTECUBITAL Performed at [facility], [place] [address] [place], [place] [address]    Special Requests      BOTTLES DRAWN AEROBIC AND ANAEROBIC Blood Culture adequate volume Performed at [place] [address] [place], [place] [address]    Culture      NO GROWTH 2 DAYS Performed at [facility], [place] [address] [place], [place] [address]    Report Status PENDING   Blood culture (routine x 2)     Status: None (Preliminary result)   Collection Time: 12/16/21  9:52 PM   Specimen: BLOOD  Result Value Ref Range   Specimen Description      BLOOD BLOOD RIGHT FOREARM Performed at [place] [address], [place], [place] [address]    Special Requests      BOTTLES DRAWN AEROBIC AND ANAEROBIC Blood  Culture adequate volume Performed at [place] [address] [place], [place] [address]    Culture      NO GROWTH 2 DAYS Performed at [facility], [place] [address], [place], [place] [address]    Report Status PENDING   Magnesium     Status: None   Collection Time: 12/17/21  4:37 AM  Result Value Ref Range   Magnesium 1.9 1.7 - 2.4 mg/dL    Comment: Performed at [facility], [place] [address], [place], [place] [id number]  Basic metabolic panel     Status: Abnormal   Collection Time: 12/17/21  4:37 AM  Result Value Ref Range   Sodium 131 (L) 135 - 145 mmol/L   Potassium 3.5 3.5 - 5.1 mmol/L   Chloride 100 98 - 111 mmol/L   CO2 26 22 - 32 mmol/L   Glucose, Bld 96 70 - 99 mg/dL    Comment: Glucose reference range applies only to samples taken after fasting for at least 8 hours.   BUN 14 6 - 20 mg/dL   Creatinine, Ser 0.96 0.61 - 1.24 mg/dL   Calcium 8.3 (L) 8.9 - 10.3 mg/dL   GFR, Estimated >60 >60 mL/min    Comment: (NOTE) Calculated using the CKD-EPI Creatinine Equation (2021)    Anion gap 5 5 - 15    Comment: Performed at [facility], [place] [address] [place], [place] [address]  CBC     Status: None   Collection Time: 12/17/21  4:37 AM  Result Value Ref Range   WBC 7.2 4.0 - 10.5 K/uL   RBC 4.66 4.22 - 5.81 MIL/uL   Hemoglobin 13.4 13.0 - 17.0 g/dL   HCT 40.3 39.0 - 52.0 %   MCV 86.5 80.0 - 100.0 fL  MCH 28.8 26.0 - 34.0 pg   MCHC 33.3 30.0 - 36.0 g/dL   RDW 74.1 28.7 - 86.7 %   Platelets 179 150 - 400 K/uL   nRBC 0.0 0.0 - 0.2 %    Comment: Performed at [facility], [address] [place], [place] [address]  CBC     Status: None   Collection Time: 12/18/21  5:04 AM  Result Value Ref Range   WBC 5.0 4.0 - 10.5 K/uL   RBC 4.64 4.22 - 5.81 MIL/uL   Hemoglobin 13.1 13.0 - 17.0 g/dL   HCT 47.0 96.2 - 83.6 %   MCV 86.2 80.0 - 100.0 fL   MCH 28.2 26.0 - 34.0 pg   MCHC 32.8 30.0 - 36.0  g/dL   RDW 62.9 47.6 - 54.6 %   Platelets 176 150 - 400 K/uL   nRBC 0.0 0.0 - 0.2 %    Comment: Performed at [facility], [address], [place], [place] [address]  Basic metabolic panel     Status: Abnormal   Collection Time: 12/18/21  5:04 AM  Result Value Ref Range   Sodium 133 (L) 135 - 145 mmol/L   Potassium 3.4 (L) 3.5 - 5.1 mmol/L   Chloride 100 98 - 111 mmol/L   CO2 27 22 - 32 mmol/L   Glucose, Bld 98 70 - 99 mg/dL    Comment: Glucose reference range applies only to samples taken after fasting for at least 8 hours.   BUN 19 6 - 20 mg/dL   Creatinine, Ser 6.56 0.61 - 1.24 mg/dL   Calcium 8.3 (L) 8.9 - 10.3 mg/dL   GFR, Estimated >81 >27 mL/min    Comment: (NOTE) Calculated using the CKD-EPI Creatinine Equation (2021)    Anion gap 6 5 - 15    Comment: Performed at [facility], [address], [place], [place] [address]  Magnesium     Status: None   Collection Time: 12/18/21  5:04 AM  Result Value Ref Range   Magnesium 2.2 1.7 - 2.4 mg/dL    Comment: Performed at [facility], [address], [place], [place] [address]  Phosphorus     Status: None   Collection Time: 12/18/21  5:04 AM  Result Value Ref Range   Phosphorus 4.1 2.5 - 4.6 mg/dL    Comment: Performed at [facility], [address] [place], [place] [address]  Hepatic function panel     Status: Abnormal   Collection Time: 12/18/21  5:04 AM  Result Value Ref Range   Total Protein 7.7 6.5 - 8.1 g/dL   Albumin 2.9 (L) 3.5 - 5.0 g/dL   AST 916 (H) 15 - 41 U/L   ALT 192 (H) 0 - 44 U/L   Alkaline Phosphatase 74 38 - 126 U/L   Total Bilirubin 0.8 0.3 - 1.2 mg/dL   Bilirubin, Direct 0.3 (H) 0.0 - 0.2 mg/dL   Indirect Bilirubin 0.5 0.3 - 0.9 mg/dL    Comment: Performed at [facility], [address] [place], [place] [address]   CT ABDOMEN PELVIS W CONTRAST  Result Date: 12/16/2021 CLINICAL DATA:  Left lower  quadrant abdominal pain. EXAM: CT ABDOMEN AND PELVIS WITH CONTRAST TECHNIQUE: Multidetector CT imaging of the abdomen and pelvis was performed using the standard protocol following bolus administration of intravenous contrast. RADIATION DOSE REDUCTION: This exam was performed according to the departmental dose-optimization program which includes automated exposure  control, adjustment of the mA and/or kV according to patient size and/or use of iterative reconstruction technique. CONTRAST:  165mL OMNIPAQUE IOHEXOL 300 MG/ML  SOLN COMPARISON:  None Available. FINDINGS: Lower chest: The visualized lung bases are clear. No intra-abdominal free air or free fluid. Hepatobiliary: Mild fatty liver. No biliary dilatation. The gallbladder is unremarkable. Pancreas: Unremarkable. No pancreatic ductal dilatation or surrounding inflammatory changes. Spleen: Normal in size without focal abnormality. Adrenals/Urinary Tract: The adrenal glands unremarkable. The kidneys, visualized ureters, and urinary bladder appear unremarkable. Stomach/Bowel: There is mild circumferential thickening of the rectum with mild perirectal haziness. Clinical correlation is recommended to evaluate for possibility of proctitis. There is no bowel obstruction. The appendix is normal. Vascular/Lymphatic: The abdominal aorta and IVC unremarkable. No portal venous gas. Left external iliac chain adenopathy measures 19 mm, likely reactive. Reproductive: The prostate and seminal vesicles are grossly unremarkable. No pelvic mass. Other: Inflammatory changes of the skin and subcutaneous soft tissues of the left anterior pelvic wall suspicious for cellulitis. No drainable fluid collection or abscess. No soft tissue gas. Musculoskeletal: No acute or significant osseous findings. IMPRESSION: 1. Mild circumferential thickening of the rectum with mild perirectal haziness. Clinical correlation is recommended to evaluate for possibility of proctitis. 2. No bowel  obstruction. Normal appendix. 3. Inflammatory changes of the skin and subcutaneous soft tissues of the left anterior pelvic wall suspicious for cellulitis. No drainable fluid collection or abscess. Electronically Signed   By: Anner Crete M.D.   On: 12/16/2021 20:57   CT HEAD WO CONTRAST (5MM)  Result Date: 12/16/2021 CLINICAL DATA:  Trauma EXAM: CT HEAD WITHOUT CONTRAST TECHNIQUE: Contiguous axial images were obtained from the base of the skull through the vertex without intravenous contrast. RADIATION DOSE REDUCTION: This exam was performed according to the departmental dose-optimization program which includes automated exposure control, adjustment of the mA and/or kV according to patient size and/or use of iterative reconstruction technique. COMPARISON:  None Available. FINDINGS: Brain: No acute intracranial findings are seen. There are no signs of bleeding within the cranium. Ventricles are not dilated. There is no focal edema or mass effect. Vascular: Unremarkable. Skull: No fracture is seen in calvarium. Sinuses/Orbits: Unremarkable. Other: None. IMPRESSION: No acute intracranial findings are seen in noncontrast CT brain. Electronically Signed   By: Elmer Picker M.D.   On: 12/16/2021 17:59      Assessment/Plan Abdominal wall cellulitis with abscess - area of fluctuance on exam consistent with abscess. Bedside I and D performed with copious purulence expressed. Recommend broadening antibiotics to vanc/zosyn - can have diet from our standpoint - bid dressing changes. Abscess cavity packed with iodoform gauze. This should remain until recheck by general surgery tomorrow  FEN: NPO ID: ceftriaxone, Symtuza/tivicay VTE: [email or web address]   I reviewed ED provider notes, hospitalist notes, last 24 h vitals and pain scores, last 48 h intake and output, last 24 h labs and trends, and last 24 h imaging results.   Winferd Humphrey, [facility] Surgery 12/18/2021, 12:33 PM Please see  Amion for pager number during day hours 7:00am-4:30pm

## 2021-12-18 NOTE — Progress Notes (Signed)
Pharmacy Antibiotic Note  Daniel Dunlap is a 41 y.o. male admitted on 12/16/2021 with abdominal wall cellulitis with abscess s/p I&D.  Pharmacy has been consulted for vancomycin dosing.  Plan: Vancomycin 1250mg  IV q12h for estimated AUC 468 using SCr 1.11, Vd 0.72 Check vancomycin levels as needed, goal AUC 400-550 Follow up renal function & cultures  Height: 5\' 10"  (177.8 cm) Weight: 93.3 kg (205 lb 11.2 oz) IBW/kg (Calculated) : 73  Temp (24hrs), Avg:98.2 F (36.8 C), Min:97.7 F (36.5 C), Max:98.9 F (37.2 C)  Recent Labs  Lab 12/16/21 1743 12/16/21 2025 12/17/21 0437 12/18/21 0504  WBC 11.3*  --  7.2 5.0  CREATININE 1.16  --  0.96 1.11  LATICACIDVEN  --  1.4  --   --     Estimated Creatinine Clearance: 100.5 mL/min (by C-G formula based on SCr of 1.11 mg/dL).    No Known Allergies  Antimicrobials this admission: 8/23 Vancomycin >> 8/23 Cefepime >> 8/23 Flagyl >>  Dose adjustments this admission:  Microbiology results: 8/23 BCx:  Thank you for allowing pharmacy to be a part of this patient's care.  9/23, PharmD, BCPS Pharmacy: [phone] 12/18/2021 4:28 PM

## 2021-12-19 DIAGNOSIS — L02211 Cutaneous abscess of abdominal wall: Secondary | ICD-10-CM

## 2021-12-19 LAB — PHOSPHORUS: Phosphorus: 3.4 mg/dL (ref 2.5–4.6)

## 2021-12-19 LAB — CBC WITH DIFFERENTIAL/PLATELET
Abs Immature Granulocytes: 0.01 10*3/uL (ref 0.00–0.07)
Basophils Absolute: 0 10*3/uL (ref 0.0–0.1)
Basophils Relative: 0 %
Eosinophils Absolute: 0.1 10*3/uL (ref 0.0–0.5)
Eosinophils Relative: 3 %
HCT: 38.6 % — ABNORMAL LOW (ref 39.0–52.0)
Hemoglobin: 12.7 g/dL — ABNORMAL LOW (ref 13.0–17.0)
Immature Granulocytes: 0 %
Lymphocytes Relative: 23 %
Lymphs Abs: 0.8 10*3/uL (ref 0.7–4.0)
MCH: 28.4 pg (ref 26.0–34.0)
MCHC: 32.9 g/dL (ref 30.0–36.0)
MCV: 86.4 fL (ref 80.0–100.0)
Monocytes Absolute: 0.4 10*3/uL (ref 0.1–1.0)
Monocytes Relative: 12 %
Neutro Abs: 2.2 10*3/uL (ref 1.7–7.7)
Neutrophils Relative %: 62 %
Platelets: 213 10*3/uL (ref 150–400)
RBC: 4.47 MIL/uL (ref 4.22–5.81)
RDW: 14.6 % (ref 11.5–15.5)
WBC: 3.5 10*3/uL — ABNORMAL LOW (ref 4.0–10.5)
nRBC: 0 % (ref 0.0–0.2)

## 2021-12-19 LAB — COMPREHENSIVE METABOLIC PANEL
ALT: 204 U/L — ABNORMAL HIGH (ref 0–44)
AST: 208 U/L — ABNORMAL HIGH (ref 15–41)
Albumin: 2.8 g/dL — ABNORMAL LOW (ref 3.5–5.0)
Alkaline Phosphatase: 95 U/L (ref 38–126)
Anion gap: 5 (ref 5–15)
BUN: 13 mg/dL (ref 6–20)
CO2: 25 mmol/L (ref 22–32)
Calcium: 8.5 mg/dL — ABNORMAL LOW (ref 8.9–10.3)
Chloride: 108 mmol/L (ref 98–111)
Creatinine, Ser: 1.08 mg/dL (ref 0.61–1.24)
GFR, Estimated: >60 mL/min (ref >60)
Glucose, Bld: 98 mg/dL (ref 70–99)
Potassium: 4 mmol/L (ref 3.5–5.1)
Sodium: 138 mmol/L (ref 135–145)
Total Bilirubin: 1 mg/dL (ref 0.3–1.2)
Total Protein: 7.4 g/dL (ref 6.5–8.1)

## 2021-12-19 LAB — MAGNESIUM: Magnesium: 2 mg/dL (ref 1.7–2.4)

## 2021-12-19 NOTE — Progress Notes (Signed)
Progress Note     Subjective: Pain is improved following drainage yesterday. No nausea/emesis, fever/chills  Objective: Vital signs in last 24 hours: Temp:  [97.9 F (36.6 C)-98.7 F (37.1 C)] 97.9 F (36.6 C) (08/24 0551) Pulse Rate:  [53-63] 53 (08/24 0551) Resp:  [14-18] 18 (08/24 0551) BP: (114-121)/(77-83) 114/83 (08/24 0551) SpO2:  [93 %-100 %] 100 % (08/24 0551) Last BM Date : 12/18/21  Intake/Output from previous day: 08/23 0701 - 08/24 0700 In: 2608.6 [P.O.:1440; I.V.:868.6; IV Piggyback:300] Out: 500 [Urine:500] Intake/Output this shift: No intake/output data recorded.  Physical Exam: General: pleasant, WD, male who is laying in bed in NAD Lungs: Respiratory effort nonlabored Abd: soft, NT, ND MSK: all 4 extremities are symmetrical with no cyanosis, clubbing, or edema. Skin: warm and dry. Left lower abdminal wall with significantly improved erythema and induration surrounding site of I&D. Packing in place with some bloody purulent drainage from wound Neuro: Cranial nerves 2-12 grossly intact, sensation is normal throughout Psych: A&Ox3 with an appropriate affect   Lab Results:  Recent Labs    12/18/21 0504 12/19/21 0437  WBC 5.0 3.5*  HGB 13.1 12.7*  HCT 40.0 38.6*  PLT 176 213   BMET Recent Labs    12/18/21 0504 12/19/21 0437  NA 133* 138  K 3.4* 4.0  CL 100 108  CO2 27 25  GLUCOSE 98 98  BUN 19 13  CREATININE 1.11 1.08  CALCIUM 8.3* 8.5*   PT/INR No results for input(s): "LABPROT", "INR" in the last 72 hours. CMP     Component Value Date/Time   NA 138 12/19/2021 0437   K 4.0 12/19/2021 0437   CL 108 12/19/2021 0437   CO2 25 12/19/2021 0437   GLUCOSE 98 12/19/2021 0437   BUN 13 12/19/2021 0437   CREATININE 1.08 12/19/2021 0437   CREATININE 0.79 12/27/2020 1105   CALCIUM 8.5 (L) 12/19/2021 0437   PROT 7.4 12/19/2021 0437   ALBUMIN 2.8 (L) 12/19/2021 0437   AST 208 (H) 12/19/2021 0437   ALT 204 (H) 12/19/2021 0437   ALKPHOS 95  12/19/2021 0437   BILITOT 1.0 12/19/2021 0437   GFRNONAA >60 12/19/2021 0437   GFRNONAA 99 07/31/2020 1027   GFRAA 115 07/31/2020 1027   Lipase  No results found for: "LIPASE"     Studies/Results: US Abdomen Limited RUQ (LIVER/GB)  Result Date: 12/18/2021 CLINICAL DATA:  [id number]; abnormal LFTs EXAM: ULTRASOUND ABDOMEN LIMITED RIGHT UPPER QUADRANT COMPARISON:  None Available. FINDINGS: Gallbladder: There is some sludge seen in the dependent mildly enlarged gallbladder. Gallbladder wall measures 2.7 mm and is within normal limits. No pericholecystic fluid. No sonographic Murphy sign noted by sonographer. Common bile duct: Diameter: 2.7 mm Liver: No focal lesion identified. Within normal limits in parenchymal echogenicity. Portal vein is patent on color Doppler imaging with normal direction of blood flow towards the liver. Other: None. IMPRESSION: Biliary sludge without evidence of cholecystitis. Gallbladder is mildly dilated. Electronically Signed   By: Marjo Bicker M.D.   On: 12/18/2021 16:34    Anti-infectives: Anti-infectives (From admission, onward)    Start     Dose/Rate Route Frequency Ordered Stop   12/18/21 1800  ceFEPIme (MAXIPIME) 2 g in sodium chloride 0.9 % 100 mL IVPB        2 g 200 mL/hr over 30 Minutes Intravenous Every 8 hours 12/18/21 1625     12/18/21 1800  metroNIDAZOLE (FLAGYL) IVPB 500 mg        500 mg 100 mL/hr  over 60 Minutes Intravenous Every 12 hours 12/18/21 1625     12/18/21 1800  vancomycin (VANCOREADY) IVPB 1250 mg/250 mL        1,250 mg 166.7 mL/hr over 90 Minutes Intravenous Every 12 hours 12/18/21 1626     12/17/21 2200  cefTRIAXone (ROCEPHIN) 1 g in sodium chloride 0.9 % 100 mL IVPB  Status:  Discontinued        1 g 200 mL/hr over 30 Minutes Intravenous Every 24 hours 12/16/21 2257 12/18/21 1624   12/17/21 1000  dolutegravir (TIVICAY) tablet 50 mg        50 mg Oral Daily 12/16/21 2301     12/17/21 0800  Darunavir-Cobicistat-Emtricitabine-Tenofovir  Alafenamide (SYMTUZA) 800-150-200-10 MG TABS 1 tablet        1 tablet Oral Daily with breakfast 12/16/21 2301     12/16/21 2115  vancomycin (VANCOREADY) IVPB 1750 mg/350 mL        1,750 mg 175 mL/hr over 120 Minutes Intravenous  Once 12/16/21 2100 12/17/21 0708   12/16/21 2115  cefTRIAXone (ROCEPHIN) 2 g in sodium chloride 0.9 % 100 mL IVPB        2 g 200 mL/hr over 30 Minutes Intravenous  Once 12/16/21 2110 12/16/21 2215   12/16/21 2115  metroNIDAZOLE (FLAGYL) IVPB 500 mg        500 mg 100 mL/hr over 60 Minutes Intravenous  Once 12/16/21 2110 12/17/21 0708        Assessment/Plan  Abdominal wall cellulitis with abscess - s/p bedside I&D 8/23 - WBC 3.5 (5.0) - continue abx  Surrounding erythema and induration significantly improved. Packing to remain today and will replace tomorrow. He will wound follow up in our clinic after discharge  FEN: regular ID: ceftriaxone cefepime, flagyl, vanc. Tivicay/symtuza VTE: lovenox  Per primary HIV Syncope Abnormal LFTs  I reviewed hospitalist notes, last 24 h vitals and pain scores, last 48 h intake and output, last 24 h labs and trends, and last 24 h imaging results.   LOS: 2 days   Eric Form, [facility] Surgery 12/19/2021, 9:47 AM Please see Amion for pager number during day hours 7:00am-4:30pm

## 2021-12-19 NOTE — Progress Notes (Signed)
PROGRESS NOTE    An Mendolia  BLT:[id number] DOB: 1980/06/26 DOA: 12/16/2021 PCP: Pcp, No   Brief Narrative:  Daniel Dunlap is a 41 y.o. male with medical history significant for HIV (RNA 561, CD4 268 on 04/17/2021) on Symtuza/Tivicay who is admitted with left lower abdominal wall pain with concern for cellulitis and induration.  He noticed some swelling to the left lower abdomen approximately 3 to 4 days ago and he noticed a small bump in the area concerning for folliculitis.  He tried to manipulate the area and remove the hair without success and had progressive swelling and induration of his left lower abdomen with erythema.  On the day prior to admission on the morning morning he became lightheaded when he stood up and did lose consciousness.  He did hit his head just above his left eye and states he was out for about 30 minutes and felt diaphoretic prior to passing out.  He denies chest pain or shortness of breath.  When presenting to the ED he was noted to have elevated WBC and a Tmax of 99.9.  Blood cultures were obtained and a HIV RNA and CD4 ordered and a head CT scan was done which was negative for any acute intracranial findings.  CT of the abdomen pelvis was done with contrast that showed mild circumferential thickening of the rectum with mild perirectal haziness but no bowel obstruction and the appendix appeared normal.  There was left anterior pelvic wall inflammatory changes suspicious for cellulitis noted without drainable abscess or fluid collection.  He was given IV vancomycin, ceftriaxone and Flagyl and the hospitalist team was consulted.  His antibiotics were de-escalated to IV ceftriaxone and given slow improvement General surgery was consulted for further evaluation of his induration and cellulitis.  Assessment and Plan: * Cellulitis of abdominal wall with abscess -Left lower abdominal cellulitis after initial folliculitis.   -Has mild leukocytosis with WBC 11.3 which is now  improved to 5.0 yesterday and today is 3.5 -Continued IV ceftriaxone 1 g every 24 hours but surgery recommended broadening antibiotics and added metronidazole 500 mg every 12, and IV vancomycin every 12 -Continue with pain control and supportive care -Discontinued IV ketorolac -Continue with IV morphine 1 mg every 3 as needed for severe pain along with oxycodone IR 5 mg every 4 as needed for moderate pain -Continue with supportive care and antiemetics with ondansetron 4 mg p.o./IV every 6 as needed for nausea  -Surgery consulted for further evaluation in the area of fluctuance on exam was consistent with abscess and they did a bedside I&D with had copious purulence expressed; his abscess cavity was packed with iodoform gauze and he feels much better now -The erythema and induration is significantly improved and packing was recommended to be remaining today and replaced tomorrow and he will follow-up with the general surgery clinic after discharge -Unfortunately no cultures were sent when the patient's abscess was drained so I have consulted ID for further antibiotic recommendations  Syncope -History suggestive of orthostatic syncope.   -Reports hitting his head without significant injury.   -CT head negative for acute findings. -Continued IV fluid hydration and will resume IV fluids at 75 MLS per hour with normal saline -Check orthostatic vital signs -Consider PT OT evaluation but will hold off for now   Hyponatremia -Mild with sodium 127 on admission.  Suspect volume depletion -Improved with IV fluid hydration.  Sodium is now gone from 127 -> 133 -> 138 -Continue to monitor and trend and  repeat CMP in a.m.   Abnormal LFTs -Unclear etiology -AST has trended up to 160 and ALT was 192; now AST is 208 and ALT is 204 -Right upper quadrant ultrasound done and showed "Biliary sludge without evidence of cholecystitis. Gallbladder is mildly dilated" -Acute hepatitis panel is negative but hepatitis  B surface antigen is pending -Continue monitor and trend liver function carefully and repeat CMP in the a.m. -Because LFTs went up GI Dr. Elnoria Howard has been consulted for further evaluation and recommendations   Hypoalbuminemia -Patient's albumin level is now 2.8 -Continue to monitor and trend and repeat CMP in the a.m.   Hypokalemia -Potassium was 3.4 and improved to 4.0 -Replete with po Kcl 40 mEQ BID x2 -Checked Mag Level and was 2.0 -Continue to Monitor and Trend and Replete as Necessary -Repeat CMP in the AM   HIV disease (HCC) -CD4 was 149 -Continue home Antivirals with Symtuza and Dolutegravir 50 mg p.o. daily  Normocytic Anemia -Patient's hemoglobin/hematocrit has dropped from 14.8/43.7 -> 13.4/40.3 -> 13.1/40.0 -> 12.7/38.6 -Check Anemia Panel in the AM -Continue to monitor for signs and symptoms; No overt bleeding noted -Repeat CBC in the a.m.   Overweight -Complicates overall prognosis and care -Estimated body mass index is 29.51 kg/m as calculated from the following:   Height as of this encounter: 5\' 10"  (1.778 m).   Weight as of this encounter: 93.3 kg.  -Weight Loss and Dietary Counseling given  DVT prophylaxis: enoxaparin (LOVENOX) injection 40 mg Start: 12/17/21 1000    Code Status: Full Code Family Communication: No family present at bedside   Disposition Plan:  Level of care: Med-Surg Status is: Inpatient Remains inpatient appropriate because: Needs further General Surgery and GI Clearance   Consultants:  General Surgery  Gastroenterology  Infectious Diseases   Procedures:  Incision and Drainage  Antimicrobials:  Anti-infectives (From admission, onward)    Start     Dose/Rate Route Frequency Ordered Stop   12/18/21 1800  ceFEPIme (MAXIPIME) 2 g in sodium chloride 0.9 % 100 mL IVPB        2 g 200 mL/hr over 30 Minutes Intravenous Every 8 hours 12/18/21 1625     12/18/21 1800  metroNIDAZOLE (FLAGYL) IVPB 500 mg        500 mg 100 mL/hr over 60  Minutes Intravenous Every 12 hours 12/18/21 1625     12/18/21 1800  vancomycin (VANCOREADY) IVPB 1250 mg/250 mL        1,250 mg 166.7 mL/hr over 90 Minutes Intravenous Every 12 hours 12/18/21 1626     12/17/21 2200  cefTRIAXone (ROCEPHIN) 1 g in sodium chloride 0.9 % 100 mL IVPB  Status:  Discontinued        1 g 200 mL/hr over 30 Minutes Intravenous Every 24 hours 12/16/21 2257 12/18/21 1624   12/17/21 1000  dolutegravir (TIVICAY) tablet 50 mg        50 mg Oral Daily 12/16/21 2301     12/17/21 0800  Darunavir-Cobicistat-Emtricitabine-Tenofovir Alafenamide (SYMTUZA) 800-150-200-10 MG TABS 1 tablet        1 tablet Oral Daily with breakfast 12/16/21 2301     12/16/21 2115  vancomycin (VANCOREADY) IVPB 1750 mg/350 mL        1,750 mg 175 mL/hr over 120 Minutes Intravenous  Once 12/16/21 2100 12/17/21 0708   12/16/21 2115  cefTRIAXone (ROCEPHIN) 2 g in sodium chloride 0.9 % 100 mL IVPB        2 g 200 mL/hr over 30 Minutes Intravenous  Once 12/16/21 2110 12/16/21 2215   12/16/21 2115  metroNIDAZOLE (FLAGYL) IVPB 500 mg        500 mg 100 mL/hr over 60 Minutes Intravenous  Once 12/16/21 2110 12/17/21 0708       Subjective: Seen and examined at bedside and he thinks he is feeling much better.  Denies chest pain or shortness of breath.  States that the pain medication is causing mild constipation.  Does not want the ketorolac because it thinks it makes his urine dark.  Asking about his LFTs.  Denies any other concerns or complaints at this time and is improved.  Objective: Vitals:   12/18/21 0605 12/18/21 1350 12/18/21 2215 12/19/21 0551  BP: 100/69 119/81 121/77 114/83  Pulse: (!) 55 61 63 (!) 53  Resp: 18 14 18 18   Temp: 97.7 F (36.5 C) 98 F (36.7 C) 98.7 F (37.1 C) 97.9 F (36.6 C)  TempSrc: Oral Oral Oral Oral  SpO2: 98% 97% 93% 100%  Weight:      Height:        Intake/Output Summary (Last 24 hours) at 12/19/2021 1310 Last data filed at 12/19/2021 1000 Gross per 24 hour   Intake 2488.63 ml  Output 500 ml  Net 1988.63 ml   Filed Weights   12/16/21 1718  Weight: 93.3 kg   Examination: Physical Exam:  Constitutional: WN/WD overweight African-American male currently in no acute distress Respiratory: Diminished to auscultation bilaterally, no wheezing, rales, rhonchi or crackles. Normal respiratory effort and patient is not tachypenic. No accessory muscle use.  Unlabored breathing Cardiovascular: RRR, no murmurs / rubs / gallops. S1 and S2 auscultated. No extremity edema.  Abdomen: Soft, non-tender, slightly distended secondary body habitus and area of incision is covered.  Erythema is improved.  Bowel sounds positive.  GU: Deferred. Musculoskeletal: No clubbing / cyanosis of digits/nails. No joint deformity upper and lower extremities Skin: No rashes, lesions, ulcers on a limited skin evaluation. No induration; Warm and dry.  Neurologic: CN 2-12 grossly intact with no focal deficits. Romberg sign and cerebellar reflexes not assessed.  Psychiatric: Normal judgment and insight. Alert and oriented x 3. Normal mood and appropriate affect.   Data Reviewed: I have personally reviewed following labs and imaging studies  CBC: Recent Labs  Lab 12/16/21 1743 12/17/21 0437 12/18/21 0504 12/19/21 0437  WBC 11.3* 7.2 5.0 3.5*  NEUTROABS  --   --   --  2.2  HGB 14.8 13.4 13.1 12.7*  HCT 43.7 40.3 40.0 38.6*  MCV 83.7 86.5 86.2 86.4  PLT 227 179 176 213   Basic Metabolic Panel: Recent Labs  Lab 12/16/21 1743 12/17/21 0437 12/18/21 0504 12/19/21 0437  NA 127* 131* 133* 138  K 3.3* 3.5 3.4* 4.0  CL 95* 100 100 108  CO2 22 26 27 25   GLUCOSE 127* 96 98 98  BUN 16 14 19 13   CREATININE 1.16 0.96 1.11 1.08  CALCIUM 9.0 8.3* 8.3* 8.5*  MG  --  1.9 2.2 2.0  PHOS  --   --  4.1 3.4   GFR: Estimated Creatinine Clearance: 103.3 mL/min (by C-G formula based on SCr of 1.08 mg/dL). Liver Function Tests: Recent Labs  Lab 12/18/21 0504 12/19/21 0437  AST  160* 208*  ALT 192* 204*  ALKPHOS 74 95  BILITOT 0.8 1.0  PROT 7.7 7.4  ALBUMIN 2.9* 2.8*   No results for input(s): "LIPASE", "AMYLASE" in the last 168 hours. No results for input(s): "AMMONIA" in the last 168 hours.  Coagulation Profile: No results for input(s): "INR", "PROTIME" in the last 168 hours. Cardiac Enzymes: No results for input(s): "CKTOTAL", "CKMB", "CKMBINDEX", "TROPONINI" in the last 168 hours. BNP (last 3 results) No results for input(s): "PROBNP" in the last 8760 hours. HbA1C: No results for input(s): "HGBA1C" in the last 72 hours. CBG: Recent Labs  Lab 12/16/21 1802  GLUCAP 126*   Lipid Profile: No results for input(s): "CHOL", "HDL", "LDLCALC", "TRIG", "CHOLHDL", "LDLDIRECT" in the last 72 hours. Thyroid Function Tests: No results for input(s): "TSH", "T4TOTAL", "FREET4", "T3FREE", "THYROIDAB" in the last 72 hours. Anemia Panel: No results for input(s): "VITAMINB12", "FOLATE", "FERRITIN", "TIBC", "IRON", "RETICCTPCT" in the last 72 hours. Sepsis Labs: Recent Labs  Lab 12/16/21 2025  LATICACIDVEN 1.4    Recent Results (from the past 240 hour(s))  Blood culture (routine x 2)     Status: None (Preliminary result)   Collection Time: 12/16/21  9:51 PM   Specimen: BLOOD  Result Value Ref Range Status   Specimen Description   Final    BLOOD RIGHT ANTECUBITAL Performed at [facility], [address], [place], [place] [address]    Special Requests   Final    BOTTLES DRAWN AEROBIC AND ANAEROBIC Blood Culture adequate volume Performed at [facility], [address], [place], [place] [address]    Culture   Final    NO GROWTH 3 DAYS Performed at [facility], [address] [place], [place] [address]    Report Status PENDING  Incomplete  Blood culture (routine x 2)     Status: None (Preliminary result)   Collection Time: 12/16/21  9:52 PM   Specimen: BLOOD  Result Value Ref Range Status   Specimen  Description   Final    BLOOD BLOOD RIGHT FOREARM Performed at [facility], [address] [place], [place] [address]    Special Requests   Final    BOTTLES DRAWN AEROBIC AND ANAEROBIC Blood Culture adequate volume Performed at [facility], [address] [place], [place] [address]    Culture   Final    NO GROWTH 3 DAYS Performed at [facility], [address], [place], [place] [address]    Report Status PENDING  Incomplete     Radiology Studies: US Abdomen Limited RUQ (LIVER/GB)  Result Date: 12/18/2021 CLINICAL DATA:  [id number]; abnormal LFTs EXAM: ULTRASOUND ABDOMEN LIMITED RIGHT UPPER QUADRANT COMPARISON:  None Available. FINDINGS: Gallbladder: There is some sludge seen in the dependent mildly enlarged gallbladder. Gallbladder wall measures 2.7 mm and is within normal limits. No pericholecystic fluid. No sonographic Murphy sign noted by sonographer. Common bile duct: Diameter: 2.7 mm Liver: No focal lesion identified. Within normal limits in parenchymal echogenicity. Portal vein is patent on color Doppler imaging with normal direction of blood flow towards the liver. Other: None. IMPRESSION: Biliary sludge without evidence of cholecystitis. Gallbladder is mildly dilated. Electronically Signed   By: Marjo Bicker M.D.   On: 12/18/2021 16:34    Scheduled Meds:  Darunavir-Cobicistat-Emtricitabine-Tenofovir Alafenamide  1 tablet Oral Q breakfast   dolutegravir  50 mg Oral Daily   enoxaparin (LOVENOX) injection  40 mg Subcutaneous Q24H   Continuous Infusions:  sodium chloride 75 mL/hr at 12/19/21 0634   ceFEPime (MAXIPIME) IV 2 g (12/19/21 0925)   metronidazole 500 mg (12/19/21 0521)   vancomycin 1,250 mg (12/19/21 2440)    LOS: 2 days   Marguerita Merles, DO [facility] Available via Epic  secure chat 7am-7pm After these hours, please refer to coverage provider listed on [email or web address] 12/19/2021, 1:10 PM

## 2021-12-20 ENCOUNTER — Other Ambulatory Visit (HOSPITAL_COMMUNITY): Payer: Self-pay

## 2021-12-20 ENCOUNTER — Telehealth (HOSPITAL_COMMUNITY): Payer: Self-pay | Admitting: Pharmacy Technician

## 2021-12-20 DIAGNOSIS — B169 Acute hepatitis B without delta-agent and without hepatic coma: Secondary | ICD-10-CM

## 2021-12-20 DIAGNOSIS — R7989 Other specified abnormal findings of blood chemistry: Secondary | ICD-10-CM

## 2021-12-20 DIAGNOSIS — R7401 Elevation of levels of liver transaminase levels: Secondary | ICD-10-CM

## 2021-12-20 LAB — COMPREHENSIVE METABOLIC PANEL
ALT: 252 U/L — ABNORMAL HIGH (ref 0–44)
AST: 282 U/L — ABNORMAL HIGH (ref 15–41)
Albumin: 3 g/dL — ABNORMAL LOW (ref 3.5–5.0)
Alkaline Phosphatase: 97 U/L (ref 38–126)
Anion gap: 3 — ABNORMAL LOW (ref 5–15)
BUN: 10 mg/dL (ref 6–20)
CO2: 27 mmol/L (ref 22–32)
Calcium: 8.5 mg/dL — ABNORMAL LOW (ref 8.9–10.3)
Chloride: 107 mmol/L (ref 98–111)
Creatinine, Ser: 1.06 mg/dL (ref 0.61–1.24)
GFR, Estimated: >60 mL/min (ref >60)
Glucose, Bld: 102 mg/dL — ABNORMAL HIGH (ref 70–99)
Potassium: 4 mmol/L (ref 3.5–5.1)
Sodium: 137 mmol/L (ref 135–145)
Total Bilirubin: 0.7 mg/dL (ref 0.3–1.2)
Total Protein: 7.7 g/dL (ref 6.5–8.1)

## 2021-12-20 LAB — CBC WITH DIFFERENTIAL/PLATELET
Abs Immature Granulocytes: 0.01 10*3/uL (ref 0.00–0.07)
Basophils Absolute: 0 10*3/uL (ref 0.0–0.1)
Basophils Relative: 1 %
Eosinophils Absolute: 0.2 10*3/uL (ref 0.0–0.5)
Eosinophils Relative: 5 %
HCT: 39.5 % (ref 39.0–52.0)
Hemoglobin: 12.9 g/dL — ABNORMAL LOW (ref 13.0–17.0)
Immature Granulocytes: 0 %
Lymphocytes Relative: 31 %
Lymphs Abs: 0.9 10*3/uL (ref 0.7–4.0)
MCH: 28.5 pg (ref 26.0–34.0)
MCHC: 32.7 g/dL (ref 30.0–36.0)
MCV: 87.4 fL (ref 80.0–100.0)
Monocytes Absolute: 0.4 10*3/uL (ref 0.1–1.0)
Monocytes Relative: 14 %
Neutro Abs: 1.4 10*3/uL — ABNORMAL LOW (ref 1.7–7.7)
Neutrophils Relative %: 49 %
Platelets: 216 10*3/uL (ref 150–400)
RBC: 4.52 MIL/uL (ref 4.22–5.81)
RDW: 14.7 % (ref 11.5–15.5)
WBC: 2.9 10*3/uL — ABNORMAL LOW (ref 4.0–10.5)
nRBC: 0 % (ref 0.0–0.2)

## 2021-12-20 LAB — HEPATITIS PANEL, ACUTE
HCV Ab: NONREACTIVE
Hep A IgM: NONREACTIVE
Hep B C IgM: NONREACTIVE
Hepatitis B Surface Ag: REACTIVE — AB

## 2021-12-20 LAB — MAGNESIUM: Magnesium: 1.9 mg/dL (ref 1.7–2.4)

## 2021-12-20 LAB — PHOSPHORUS: Phosphorus: 3.2 mg/dL (ref 2.5–4.6)

## 2021-12-20 MED ORDER — AMOXICILLIN-POT CLAVULANATE 875-125 MG PO TABS
1.0000 | ORAL_TABLET | Freq: Two times a day (BID) | ORAL | 0 refills | Status: AC
  Filled 2021-12-20: qty 24, 12d supply, fill #0

## 2021-12-20 MED ORDER — DOXYCYCLINE HYCLATE 100 MG PO TABS
100.0000 mg | ORAL_TABLET | Freq: Two times a day (BID) | ORAL | Status: DC
  Administered 2021-12-20 – 2021-12-22 (×5): 100 mg via ORAL
  Filled 2021-12-20 (×5): qty 1

## 2021-12-20 MED ORDER — TIVICAY 50 MG PO TABS
50.0000 mg | ORAL_TABLET | Freq: Every day | ORAL | 0 refills | Status: DC
  Filled 2021-12-20: qty 30, 30d supply, fill #0

## 2021-12-20 MED ORDER — AMOXICILLIN-POT CLAVULANATE 875-125 MG PO TABS
1.0000 | ORAL_TABLET | Freq: Two times a day (BID) | ORAL | Status: DC
  Administered 2021-12-20 – 2021-12-22 (×5): 1 via ORAL
  Filled 2021-12-20 (×5): qty 1

## 2021-12-20 MED ORDER — SULFAMETHOXAZOLE-TRIMETHOPRIM 800-160 MG PO TABS
1.0000 | ORAL_TABLET | Freq: Two times a day (BID) | ORAL | Status: DC
Start: 2021-12-20 — End: 2021-12-20
  Filled 2021-12-20: qty 1

## 2021-12-20 MED ORDER — SYMTUZA 800-150-200-10 MG PO TABS
1.0000 | ORAL_TABLET | Freq: Every day | ORAL | 0 refills | Status: DC
  Filled 2021-12-20: qty 30, 30d supply, fill #0

## 2021-12-20 MED ORDER — DOXYCYCLINE HYCLATE 100 MG PO TABS
100.0000 mg | ORAL_TABLET | Freq: Two times a day (BID) | ORAL | 0 refills | Status: AC
  Filled 2021-12-20: qty 24, 12d supply, fill #0

## 2021-12-20 NOTE — Progress Notes (Signed)
PROGRESS NOTE    Daniel Dunlap  VQM:[id number] DOB: 1980/05/20 DOA: 12/16/2021 PCP: Pcp, No   Brief Narrative:  Daniel Dunlap is a 41 y.o. male with medical history significant for HIV (RNA 561, CD4 268 on 04/17/2021) on Symtuza/Tivicay who is admitted with left lower abdominal wall pain with concern for cellulitis and induration.  He noticed some swelling to the left lower abdomen approximately 3 to 4 days ago and he noticed a small bump in the area concerning for folliculitis.  He tried to manipulate the area and remove the hair without success and had progressive swelling and induration of his left lower abdomen with erythema.  On the day prior to admission on the morning morning he became lightheaded when he stood up and did lose consciousness.  He did hit his head just above his left eye and states he was out for about 30 minutes and felt diaphoretic prior to passing out.  He denies chest pain or shortness of breath.  When presenting to the ED he was noted to have elevated WBC and a Tmax of 99.9.  Blood cultures were obtained and a HIV RNA and CD4 ordered and a head CT scan was done which was negative for any acute intracranial findings.  CT of the abdomen pelvis was done with contrast that showed mild circumferential thickening of the rectum with mild perirectal haziness but no bowel obstruction and the appendix appeared normal.  There was left anterior pelvic wall inflammatory changes suspicious for cellulitis noted without drainable abscess or fluid collection.  He was given IV vancomycin, ceftriaxone and Flagyl and the hospitalist team was consulted.  His antibiotics were de-escalated to IV ceftriaxone and given slow improvement General surgery was consulted for further evaluation of his induration and cellulitis.  ID was also consulted and GI was also consulted and patient is improving and LFTs are slightly worsening so we will observe and monitor overnight and once cleared by GI can be discharged  home   Assessment and Plan:  * Cellulitis of abdominal wall with abscess -Left lower abdominal cellulitis after initial folliculitis.   -Has mild leukocytosis with WBC 11.3 which is now improved to 5.0 yesterday and today is 3.5 -Continued IV ceftriaxone 1 g every 24 hours but surgery recommended broadening antibiotics and added metronidazole 500 mg every 12, and IV vancomycin every 12h this is now been stopped by ID -Continue with pain control and supportive care -Discontinued IV ketorolac -Continue with IV morphine 1 mg every 3 as needed for severe pain along with oxycodone IR 5 mg every 4 as needed for moderate pain -Continue with supportive care and antiemetics with ondansetron 4 mg p.o./IV every 6 as needed for nausea  -Surgery consulted for further evaluation in the area of fluctuance on exam was consistent with abscess and they did a bedside I&D with had copious purulence expressed; his abscess cavity was packed with iodoform gauze and he feels much better now -The erythema and induration is significantly improved and packing was recommended to be remaining today and replaced tomorrow and he will follow-up with the general surgery clinic after discharge -Unfortunately no cultures were sent when the patient's abscess was drained so I have consulted ID for further antibiotic recommendations -ID evaluated and have stopped his antibiotics as above and have discontinued vancomycin and cefepime and started him on doxycycline and Augmentin for 2-week course of treatment from debridement with estimated length of treatment finishing 01/01/2022  Questionable proctitis -CT scan mentioned "Mild circumferential thickening of  the rectum with mild perirectal haziness. Clinical  correlation is recommended to evaluate for possibility of proctitis. No bowel obstruction. Normal appendix. Inflammatory changes of the skin and subcutaneous soft tissues of the left anterior pelvic wall suspicious for cellulitis. No  drainable fluid collection or abscess" -Patient denies any symptoms -GI consulted and recommending outpatient colonoscopy at some point   Syncope -History suggestive of orthostatic syncope.   -Reports hitting his head without significant injury.   -CT head negative for acute findings. -Continued IV fluid hydration and will resume IV fluids at 75 MLS per hour with normal saline -Check orthostatic vital signs in the morning -Consider PT OT evaluation but will hold off for now   Hyponatremia -Mild with sodium 127 on admission.  Suspect volume depletion -Improved with IV fluid hydration.  Sodium is now gone from 127 -> 133 -> 138 and is now 137 -Continue to monitor and trend and repeat CMP in a.m.   Abnormal LFTs -Unclear etiology -AST has trended up to 160 and ALT was 192; now AST is worse and is 282 and ALT is 252 -Right upper quadrant ultrasound done and showed "Biliary sludge without evidence of cholecystitis. Gallbladder is mildly dilated" -Acute hepatitis panel is negative but hepatitis B surface antigen is pending -Continue monitor and trend liver function carefully and repeat CMP in the a.m. -Because LFTs went up GI Dr. Elnoria Howard has been consulted for further evaluation and recommendations and Dr. Elnoria Howard feels that this could be medication induced but recommends continue to monitor LFTs and feels at some point he will need a colonoscopy to further evaluate his CT findings of possible proctitis despite him having any symptoms -HBV DNA is pending and this is to be helpful for the patient -Infectious disease suspects that acute illness may have led to increase in his hepatitis B viral load location and elevated LFTs and they are recommending continue follow-up carefully and I have ordered a ultra quant and HBV antibody   Hypoalbuminemia -Patient's albumin level is now 3.0 -Continue to monitor and trend and repeat CMP in the a.m.   Hypokalemia -Potassium is now 4.0 again -Checked Mag Level  and was 1.9 -Continue to Monitor and Trend and Replete as Necessary -Repeat CMP in the AM   HIV disease (HCC) -CD4 was 149 -Continue home Antivirals with Symtuza and Dolutegravir 50 mg p.o. daily -Infectious disease was consulted and they are recommending holding off starting PJP prophylaxis until G6PD returns given that all 3 options for the PJP prophylaxis can lead to liver injury -Patient has a scheduled appointment Marcos Eke on 12/25/2021 for repeat CD4 in case that his acute illness is contributing to drop in CD4 but if CD4 count is less than 200 there we will start Bactrim in outpatient setting   Normocytic Anemia -Patient's hemoglobin/hematocrit has dropped from 14.8/43.7 -> 13.4/40.3 -> 13.1/40.0 -> 12.7/38.6 and is now 12.9/39.5 -Check Anemia Panel in the AM -Continue to monitor for signs and symptoms; No overt bleeding noted -Repeat CBC in the a.m.   Overweight -Complicates overall prognosis and care -Estimated body mass index is 29.51 kg/m as calculated from the following:   Height as of this encounter: 5\' 10"  (1.778 m).   Weight as of this encounter: 93.3 kg.  -Weight Loss and Dietary Counseling given  DVT prophylaxis: enoxaparin (LOVENOX) injection 40 mg Start: 12/17/21 1000    Code Status: Full Code Family Communication: No family currently at bedside  Disposition Plan:  Level of care: Med-Surg Status is: Inpatient  Remains inpatient appropriate because: LFTs continue to rise and needs GI clearance.  Surgery and ID have signed off the case   Consultants:  Infectious diseases General surgery Gastroenterology  Procedures:  Incision and drainage  Antimicrobials:  Anti-infectives (From admission, onward)    Start     Dose/Rate Route Frequency Ordered Stop   12/20/21 1400  sulfamethoxazole-trimethoprim (BACTRIM DS) 800-160 MG per tablet 1 tablet  Status:  Discontinued        1 tablet Oral Every 12 hours 12/20/21 1029 12/20/21 1133   12/20/21 1400   amoxicillin-clavulanate (AUGMENTIN) 875-125 MG per tablet 1 tablet        1 tablet Oral Every 12 hours 12/20/21 1029 01/01/22 2359   12/20/21 1400  doxycycline (VIBRA-TABS) tablet 100 mg        100 mg Oral Every 12 hours 12/20/21 1133 01/01/22 2359   12/20/21 0000  Darunavir-Cobicistat-Emtricitabine-Tenofovir Alafenamide (SYMTUZA) 800-150-200-10 MG TABS       Note to Pharmacy: Copay card to be added in [place]- ?'s Sharin Mons, PharmD   1 tablet Oral Daily with breakfast 12/20/21 1314     12/20/21 0000  dolutegravir (TIVICAY) 50 MG tablet       Note to Pharmacy: Copay card to be added in [facility]   50 mg Oral Daily 12/20/21 1314     12/20/21 0000  amoxicillin-clavulanate (AUGMENTIN) 875-125 MG tablet        1 tablet Oral Every 12 hours 12/20/21 1314 01/01/22 2359   12/20/21 0000  doxycycline (VIBRA-TABS) 100 MG tablet       Note to Pharmacy: Meds to Beds please if possible.   100 mg Oral Every 12 hours 12/20/21 1314 01/01/22 2359   12/18/21 1800  ceFEPIme (MAXIPIME) 2 g in sodium chloride 0.9 % 100 mL IVPB  Status:  Discontinued        2 g 200 mL/hr over 30 Minutes Intravenous Every 8 hours 12/18/21 1625 12/20/21 1029   12/18/21 1800  metroNIDAZOLE (FLAGYL) IVPB 500 mg  Status:  Discontinued        500 mg 100 mL/hr over 60 Minutes Intravenous Every 12 hours 12/18/21 1625 12/20/21 1029   12/18/21 1800  vancomycin (VANCOREADY) IVPB 1250 mg/250 mL  Status:  Discontinued        1,250 mg 166.7 mL/hr over 90 Minutes Intravenous Every 12 hours 12/18/21 1626 12/20/21 1029   12/17/21 2200  cefTRIAXone (ROCEPHIN) 1 g in sodium chloride 0.9 % 100 mL IVPB  Status:  Discontinued        1 g 200 mL/hr over 30 Minutes Intravenous Every 24 hours 12/16/21 2257 12/18/21 1624   12/17/21 1000  dolutegravir (TIVICAY) tablet 50 mg        50 mg Oral Daily 12/16/21 2301     12/17/21 0800  Darunavir-Cobicistat-Emtricitabine-Tenofovir Alafenamide (SYMTUZA) 800-150-200-10 MG TABS 1 tablet        1 tablet Oral Daily with  breakfast 12/16/21 2301     12/16/21 2115  vancomycin (VANCOREADY) IVPB 1750 mg/350 mL        1,750 mg 175 mL/hr over 120 Minutes Intravenous  Once 12/16/21 2100 12/17/21 0708   12/16/21 2115  cefTRIAXone (ROCEPHIN) 2 g in sodium chloride 0.9 % 100 mL IVPB        2 g 200 mL/hr over 30 Minutes Intravenous  Once 12/16/21 2110 12/16/21 2215   12/16/21 2115  metroNIDAZOLE (FLAGYL) IVPB 500 mg        500 mg 100 mL/hr over 60  Minutes Intravenous  Once 12/16/21 2110 12/17/21 0708       Subjective: Seen and examined at bedside he is sitting in the chair and states that he is feeling much better after the drainage.  Denies any chest pain or shortness of breath.  No nausea or vomiting.  Feels well but his LFTs are continue to rise so we will need to observe and have GI clearance prior to discharge.  Objective: Vitals:   12/19/21 1346 12/19/21 2145 12/20/21 0621 12/20/21 1350  BP: 118/78 113/81 115/81 113/82  Pulse: (!) 53 (!) 50 (!) 50 60  Resp: 18 18 16 18   Temp: 97.7 F (36.5 C) 97.6 F (36.4 C) 97.8 F (36.6 C)   TempSrc: Oral Oral Oral   SpO2: 98% 97% 98% 98%  Weight:      Height:       No intake or output data in the 24 hours ending 12/20/21 1708 Filed Weights   12/16/21 1718  Weight: 93.3 kg   Examination: Physical Exam:  Constitutional: WN/WD overweight African-American male currently no acute distress appears calm sitting in a chair bedside Respiratory: Diminished to auscultation bilaterally with coarse breath sounds, no wheezing, rales, rhonchi or crackles. Normal respiratory effort and patient is not tachypenic. No accessory muscle use.  Unlabored breathing Cardiovascular: RRR, no murmurs / rubs / gallops. S1 and S2 auscultated.  No appreciable extremity edema Abdomen: Soft, slightly-tender, distended second body habitus and has an abdominal incision from his incision and drainage. Bowel sounds positive.  GU: Deferred. Musculoskeletal: No clubbing / cyanosis of  digits/nails. No joint deformity upper and lower extremities.  Skin: No rashes, lesions, ulcers on limited skin evaluation. No induration; Warm and dry.  Neurologic: CN 2-12 grossly intact with no focal deficits. Romberg sign and cerebellar reflexes not assessed.  Psychiatric: Normal judgment and insight. Alert and oriented x 3. Normal mood and appropriate affect.   Data Reviewed: I have personally reviewed following labs and imaging studies  CBC: Recent Labs  Lab 12/16/21 1743 12/17/21 0437 12/18/21 0504 12/19/21 0437 12/20/21 0454  WBC 11.3* 7.2 5.0 3.5* 2.9*  NEUTROABS  --   --   --  2.2 1.4*  HGB 14.8 13.4 13.1 12.7* 12.9*  HCT 43.7 40.3 40.0 38.6* 39.5  MCV 83.7 86.5 86.2 86.4 87.4  PLT 227 179 176 213 216   Basic Metabolic Panel: Recent Labs  Lab 12/16/21 1743 12/17/21 0437 12/18/21 0504 12/19/21 0437 12/20/21 0454  NA 127* 131* 133* 138 137  K 3.3* 3.5 3.4* 4.0 4.0  CL 95* 100 100 108 107  CO2 22 26 27 25 27   GLUCOSE 127* 96 98 98 102*  BUN 16 14 19 13 10   CREATININE 1.16 0.96 1.11 1.08 1.06  CALCIUM 9.0 8.3* 8.3* 8.5* 8.5*  MG  --  1.9 2.2 2.0 1.9  PHOS  --   --  4.1 3.4 3.2   GFR: Estimated Creatinine Clearance: 105.2 mL/min (by C-G formula based on SCr of 1.06 mg/dL). Liver Function Tests: Recent Labs  Lab 12/18/21 0504 12/19/21 0437 12/20/21 0454  AST 160* 208* 282*  ALT 192* 204* 252*  ALKPHOS 74 95 97  BILITOT 0.8 1.0 0.7  PROT 7.7 7.4 7.7  ALBUMIN 2.9* 2.8* 3.0*   No results for input(s): "LIPASE", "AMYLASE" in the last 168 hours. No results for input(s): "AMMONIA" in the last 168 hours. Coagulation Profile: No results for input(s): "INR", "PROTIME" in the last 168 hours. Cardiac Enzymes: No results for input(s): "  CKTOTAL", "CKMB", "CKMBINDEX", "TROPONINI" in the last 168 hours. BNP (last 3 results) No results for input(s): "PROBNP" in the last 8760 hours. HbA1C: No results for input(s): "HGBA1C" in the last 72 hours. CBG: Recent Labs   Lab 12/16/21 1802  GLUCAP 126*   Lipid Profile: No results for input(s): "CHOL", "HDL", "LDLCALC", "TRIG", "CHOLHDL", "LDLDIRECT" in the last 72 hours. Thyroid Function Tests: No results for input(s): "TSH", "T4TOTAL", "FREET4", "T3FREE", "THYROIDAB" in the last 72 hours. Anemia Panel: No results for input(s): "VITAMINB12", "FOLATE", "FERRITIN", "TIBC", "IRON", "RETICCTPCT" in the last 72 hours. Sepsis Labs: Recent Labs  Lab 12/16/21 2025  LATICACIDVEN 1.4    Recent Results (from the past 240 hour(s))  Blood culture (routine x 2)     Status: None (Preliminary result)   Collection Time: 12/16/21  9:51 PM   Specimen: BLOOD  Result Value Ref Range Status   Specimen Description   Final    BLOOD RIGHT ANTECUBITAL Performed at [facility], [address] [place], [place] [address]    Special Requests   Final    BOTTLES DRAWN AEROBIC AND ANAEROBIC Blood Culture adequate volume Performed at [facility], [address], [place], [place] [address]    Culture   Final    NO GROWTH 4 DAYS Performed at [facility], [address] [place], [place] [address]    Report Status PENDING  Incomplete  Blood culture (routine x 2)     Status: None (Preliminary result)   Collection Time: 12/16/21  9:52 PM   Specimen: BLOOD  Result Value Ref Range Status   Specimen Description   Final    BLOOD BLOOD RIGHT FOREARM Performed at [facility], [address] [place], [place] [address]    Special Requests   Final    BOTTLES DRAWN AEROBIC AND ANAEROBIC Blood Culture adequate volume Performed at [facility], [address] [place], [place] [address]    Culture   Final    NO GROWTH 4 DAYS Performed at [facility], [address], [place], [place] [address]    Report Status PENDING  Incomplete     Radiology Studies: No results found.  Scheduled Meds:  amoxicillin-clavulanate  1 tablet Oral Q12H    Darunavir-Cobicistat-Emtricitabine-Tenofovir Alafenamide  1 tablet Oral Q breakfast   dolutegravir  50 mg Oral Daily   doxycycline  100 mg Oral Q12H   enoxaparin (LOVENOX) injection  40 mg Subcutaneous Q24H   Continuous Infusions:  sodium chloride 75 mL/hr at 12/19/21 2148    LOS: 3 days   Marguerita Merles, DO [facility] Available via Epic secure chat 7am-7pm After these hours, please refer to coverage provider listed on [email or web address] 12/20/2021, 5:08 PM

## 2021-12-20 NOTE — Consult Note (Signed)
Reason for Consult: Abnormal liver enzymes Referring Physician: [facility]  Crit Elsayed HPI: This is a 42 year old male with HIV, HPV, and HBV admitted for an abdominal wall cellulitis.  Three days prior to admission it started out as a small bump and it progressed after he tried to manipulate the area.  On the day of admission he had a syncopal episode and there a mild trauma above his left eye.  Further treatment was applied in the hospital with I&D.  He was treated with vancomycin and cefepime, but ID discontinued the medications and recommended doxycycline and Augmentin for two weeks.  On a routine basis he takes [place] and Tivicay for his HIV, which also covers his HBV.  His last HBV DNA on 07/20/2019 was <10 IU/mL.  During this hospitalization his liver enzymes elevated.  Since 2017 he maintained normal liver enzymes.  His last value in Epic was on 12/27/2020 with an AST of 18 and ALT at 12.  In 2017 his liver enzymes were elevated at 183 and 233, but his HBV viral load was markedly elevated.  ID feels that the acute illness was the source of the elevated liver enzymes in that it allowed a higher HBV viral replication.  An HBV ultraquant is pending as well as HBsAb, but his HBsAg yesterday was positive.  Past Medical History:  Diagnosis Date   Genital warts    HIV infection (HCC)    HPV (human papilloma virus) infection    Thyroid disease     History reviewed. No pertinent surgical history.  Family History  Problem Relation Age of Onset   Healthy Mother    Healthy Father     Social History:  reports that he has been smoking cigarettes. He has never used smokeless tobacco. He reports current alcohol use of about 1.0 standard drink of alcohol per week. He reports that he does not use drugs.  Allergies: No Known Allergies  Medications: Scheduled:  amoxicillin-clavulanate  1 tablet Oral Q12H   Darunavir-Cobicistat-Emtricitabine-Tenofovir Alafenamide  1 tablet Oral Q breakfast    dolutegravir  50 mg Oral Daily   doxycycline  100 mg Oral Q12H   enoxaparin (LOVENOX) injection  40 mg Subcutaneous Q24H   Continuous:  sodium chloride 75 mL/hr at 12/19/21 2148    Results for orders placed or performed during the hospital encounter of 12/16/21 (from the past 24 hour(s))  CBC with Differential/Platelet     Status: Abnormal   Collection Time: 12/20/21  4:54 AM  Result Value Ref Range   WBC 2.9 (L) 4.0 - 10.5 K/uL   RBC 4.52 4.22 - 5.81 MIL/uL   Hemoglobin 12.9 (L) 13.0 - 17.0 g/dL   HCT 19.5 09.3 - 26.7 %   MCV 87.4 80.0 - 100.0 fL   MCH 28.5 26.0 - 34.0 pg   MCHC 32.7 30.0 - 36.0 g/dL   RDW 12.4 58.0 - 99.8 %   Platelets 216 150 - 400 K/uL   nRBC 0.0 0.0 - 0.2 %   Neutrophils Relative % 49 %   Neutro Abs 1.4 (L) 1.7 - 7.7 K/uL   Lymphocytes Relative 31 %   Lymphs Abs 0.9 0.7 - 4.0 K/uL   Monocytes Relative 14 %   Monocytes Absolute 0.4 0.1 - 1.0 K/uL   Eosinophils Relative 5 %   Eosinophils Absolute 0.2 0.0 - 0.5 K/uL   Basophils Relative 1 %   Basophils Absolute 0.0 0.0 - 0.1 K/uL   Immature Granulocytes 0 %  Abs Immature Granulocytes 0.01 0.00 - 0.07 K/uL  Comprehensive metabolic panel     Status: Abnormal   Collection Time: 12/20/21  4:54 AM  Result Value Ref Range   Sodium 137 135 - 145 mmol/L   Potassium 4.0 3.5 - 5.1 mmol/L   Chloride 107 98 - 111 mmol/L   CO2 27 22 - 32 mmol/L   Glucose, Bld 102 (H) 70 - 99 mg/dL   BUN 10 6 - 20 mg/dL   Creatinine, Ser 4.62 0.61 - 1.24 mg/dL   Calcium 8.5 (L) 8.9 - 10.3 mg/dL   Total Protein 7.7 6.5 - 8.1 g/dL   Albumin 3.0 (L) 3.5 - 5.0 g/dL   AST 703 (H) 15 - 41 U/L   ALT 252 (H) 0 - 44 U/L   Alkaline Phosphatase 97 38 - 126 U/L   Total Bilirubin 0.7 0.3 - 1.2 mg/dL   GFR, Estimated >50 >09 mL/min   Anion gap 3 (L) 5 - 15  Magnesium     Status: None   Collection Time: 12/20/21  4:54 AM  Result Value Ref Range   Magnesium 1.9 1.7 - 2.4 mg/dL  Phosphorus     Status: None   Collection Time: 12/20/21   4:54 AM  Result Value Ref Range   Phosphorus 3.2 2.5 - 4.6 mg/dL     US Abdomen Limited RUQ (LIVER/GB)  Result Date: 12/18/2021 CLINICAL DATA:  [id number]; abnormal LFTs EXAM: ULTRASOUND ABDOMEN LIMITED RIGHT UPPER QUADRANT COMPARISON:  None Available. FINDINGS: Gallbladder: There is some sludge seen in the dependent mildly enlarged gallbladder. Gallbladder wall measures 2.7 mm and is within normal limits. No pericholecystic fluid. No sonographic Murphy sign noted by sonographer. Common bile duct: Diameter: 2.7 mm Liver: No focal lesion identified. Within normal limits in parenchymal echogenicity. Portal vein is patent on color Doppler imaging with normal direction of blood flow towards the liver. Other: None. IMPRESSION: Biliary sludge without evidence of cholecystitis. Gallbladder is mildly dilated. Electronically Signed   By: Marjo Bicker M.D.   On: 12/18/2021 16:34    ROS:  As stated above in the HPI otherwise negative.  Blood pressure 113/82, pulse 60, temperature 97.8 F (36.6 C), temperature source Oral, resp. rate 18, height 5\' 10"  (1.778 m), weight 93.3 kg, SpO2 98 %.    PE: Gen: NAD, Alert and Oriented HEENT:  [place]/AT, EOMI Neck: Supple, no LAD Lungs: CTA Bilaterally CV: RRR without M/G/R ABD: Soft, NTND, +BS Ext: No C/C/E  Assessment/Plan: 1) Abnormal liver enzymes. 2) HIV. 3) Chronic HBV. 4) ? Proctitis - he does not report any symptoms.   The elevation in his liver enzymes may be as a result of a drug reaction.  Clinically he feels well and he wants to leave AMA.  An HBV DNA is pending and this will be helpful.  Being off of vancomycin and cefepime may resolve the situation for him.  Plan: 1) Monitor liver enzymes. 2) At some point he will need a colonoscopy to further evaluate the recent CT scan findings. 3) If he leaves AMA he states that he will follow up in the office.  Murl Golladay D 12/20/2021, 3:44 PM

## 2021-12-20 NOTE — Telephone Encounter (Signed)
Patient Advocate Encounter   Was successful in obtaining a Associate Professor  copay card for [employer].  This copay card will make the patients copay $0.00.    RxBin: [id number] PCN: [id number] Member ID: [id number] Group ID: [id number]    Roland Earl, CPhT Pharmacy Patient Advocate Specialist [facility] Patient Advocate Team Direct Number: [phone]  Fax: [phone]

## 2021-12-20 NOTE — Telephone Encounter (Signed)
Patient Advocate Encounter   Was successful in obtaining a Engineer, materials copay card for [employer].  This copay card will make the patients copay $0.0.    RxBin: [id number] Member ID: [id number] Group ID: [id number]     Roland Earl, CPhT Pharmacy Patient Advocate Specialist [facility] Patient Advocate Team Direct Number: [phone]  Fax: [phone]

## 2021-12-20 NOTE — Discharge Instructions (Signed)
WOUND CARE: - packing in wound to be changed once daily and overlying dressing once daily or more as needed for soilage - supplies: sterile saline, kerlix, scissors, gauze, tape  - remove dressing and all packing carefully, moistening with sterile saline as needed to avoid packing/internal dressing sticking to the wound. - clean edges of skin around the wound with water/gauze, making sure there is no tape debris or leakage left on skin that could cause skin irritation or breakdown. - dampen and clean kerlix with sterile saline and pack wound from wound base to skin level. Wound can be packed loosely. Trim kerlix to size if a whole kerlix is not required. - cover wound with a dry dressing and secure with tape.  - change dressing as needed if leakage occurs, wound gets contaminated - you can shower daily with wound open and following the shower the wound should be dried and a clean dressing placed.

## 2021-12-20 NOTE — Progress Notes (Signed)
Progress Note     Subjective: Pain continues to improve.  Continues to have some drainage from site  Objective: Vital signs in last 24 hours: Temp:  [97.6 F (36.4 C)-97.8 F (36.6 C)] 97.8 F (36.6 C) (08/25 0621) Pulse Rate:  [50-53] 50 (08/25 0621) Resp:  [16-18] 16 (08/25 0621) BP: (113-118)/(78-81) 115/81 (08/25 0621) SpO2:  [97 %-98 %] 98 % (08/25 0621) Last BM Date : 12/18/21  Intake/Output from previous day: 08/24 0701 - 08/25 0700 In: 1511.4 [P.O.:120; I.V.:591.4; IV Piggyback:800] Out: -  Intake/Output this shift: No intake/output data recorded.  Physical Exam: General: pleasant, WD, male who is laying in bed in NAD Lungs: Respiratory effort nonlabored Abd: soft, NT, ND MSK: all 4 extremities are symmetrical with no cyanosis, clubbing, or edema. Skin: warm and dry. Left lower abdminal wall with improved erythema and induration surrounding site of I&D. Packing in place with some bloody purulent drainage from wound.  Packing removed.  Wound margins are clean.  Wound repacked Psych: A&Ox3 with an appropriate affect   Lab Results:  Recent Labs    12/19/21 0437 12/20/21 0454  WBC 3.5* 2.9*  HGB 12.7* 12.9*  HCT 38.6* 39.5  PLT 213 216    BMET Recent Labs    12/19/21 0437 12/20/21 0454  NA 138 137  K 4.0 4.0  CL 108 107  CO2 25 27  GLUCOSE 98 102*  BUN 13 10  CREATININE 1.08 1.06  CALCIUM 8.5* 8.5*    PT/INR No results for input(s): "LABPROT", "INR" in the last 72 hours. CMP     Component Value Date/Time   NA 137 12/20/2021 0454   K 4.0 12/20/2021 0454   CL 107 12/20/2021 0454   CO2 27 12/20/2021 0454   GLUCOSE 102 (H) 12/20/2021 0454   BUN 10 12/20/2021 0454   CREATININE 1.06 12/20/2021 0454   CREATININE 0.79 12/27/2020 1105   CALCIUM 8.5 (L) 12/20/2021 0454   PROT 7.7 12/20/2021 0454   ALBUMIN 3.0 (L) 12/20/2021 0454   AST 282 (H) 12/20/2021 0454   ALT 252 (H) 12/20/2021 0454   ALKPHOS 97 12/20/2021 0454   BILITOT 0.7 12/20/2021  0454   GFRNONAA >60 12/20/2021 0454   GFRNONAA 99 07/31/2020 1027   GFRAA 115 07/31/2020 1027   Lipase  No results found for: "LIPASE"     Studies/Results: US Abdomen Limited RUQ (LIVER/GB)  Result Date: 12/18/2021 CLINICAL DATA:  280034; abnormal LFTs EXAM: ULTRASOUND ABDOMEN LIMITED RIGHT UPPER QUADRANT COMPARISON:  None Available. FINDINGS: Gallbladder: There is some sludge seen in the dependent mildly enlarged gallbladder. Gallbladder wall measures 2.7 mm and is within normal limits. No pericholecystic fluid. No sonographic Murphy sign noted by sonographer. Common bile duct: Diameter: 2.7 mm Liver: No focal lesion identified. Within normal limits in parenchymal echogenicity. Portal vein is patent on color Doppler imaging with normal direction of blood flow towards the liver. Other: None. IMPRESSION: Biliary sludge without evidence of cholecystitis. Gallbladder is mildly dilated. Electronically Signed   By: Marjo Bicker M.D.   On: 12/18/2021 16:34    Anti-infectives: Anti-infectives (From admission, onward)    Start     Dose/Rate Route Frequency Ordered Stop   12/20/21 1400  sulfamethoxazole-trimethoprim (BACTRIM DS) 800-160 MG per tablet 1 tablet  Status:  Discontinued        1 tablet Oral Every 12 hours 12/20/21 1029 12/20/21 1133   12/20/21 1400  amoxicillin-clavulanate (AUGMENTIN) 875-125 MG per tablet 1 tablet  1 tablet Oral Every 12 hours 12/20/21 1029 01/01/22 2359   12/20/21 1400  doxycycline (VIBRA-TABS) tablet 100 mg        100 mg Oral Every 12 hours 12/20/21 1133 01/01/22 2359   12/18/21 1800  ceFEPIme (MAXIPIME) 2 g in sodium chloride 0.9 % 100 mL IVPB  Status:  Discontinued        2 g 200 mL/hr over 30 Minutes Intravenous Every 8 hours 12/18/21 1625 12/20/21 1029   12/18/21 1800  metroNIDAZOLE (FLAGYL) IVPB 500 mg  Status:  Discontinued        500 mg 100 mL/hr over 60 Minutes Intravenous Every 12 hours 12/18/21 1625 12/20/21 1029   12/18/21 1800  vancomycin  (VANCOREADY) IVPB 1250 mg/250 mL  Status:  Discontinued        1,250 mg 166.7 mL/hr over 90 Minutes Intravenous Every 12 hours 12/18/21 1626 12/20/21 1029   12/17/21 2200  cefTRIAXone (ROCEPHIN) 1 g in sodium chloride 0.9 % 100 mL IVPB  Status:  Discontinued        1 g 200 mL/hr over 30 Minutes Intravenous Every 24 hours 12/16/21 2257 12/18/21 1624   12/17/21 1000  dolutegravir (TIVICAY) tablet 50 mg        50 mg Oral Daily 12/16/21 2301     12/17/21 0800  Darunavir-Cobicistat-Emtricitabine-Tenofovir Alafenamide (SYMTUZA) 800-150-200-10 MG TABS 1 tablet        1 tablet Oral Daily with breakfast 12/16/21 2301     12/16/21 2115  vancomycin (VANCOREADY) IVPB 1750 mg/350 mL        1,750 mg 175 mL/hr over 120 Minutes Intravenous  Once 12/16/21 2100 12/17/21 0708   12/16/21 2115  cefTRIAXone (ROCEPHIN) 2 g in sodium chloride 0.9 % 100 mL IVPB        2 g 200 mL/hr over 30 Minutes Intravenous  Once 12/16/21 2110 12/16/21 2215   12/16/21 2115  metroNIDAZOLE (FLAGYL) IVPB 500 mg        500 mg 100 mL/hr over 60 Minutes Intravenous  Once 12/16/21 2110 12/17/21 0708        Assessment/Plan  Abdominal wall cellulitis with abscess - s/p bedside I&D 8/23 - continue abx as per ID recommendations  Surrounding erythema and induration continues to improve.  Packing changed today.  He will continue once daily packing/dressing changes at home which I instructed him on today -NS moistened Kerlix.  He will follow-up with our office in 2 weeks  FEN: regular ID: ceftriaxone cefepime, flagyl, vanc. Tivicay/symtuza VTE: lovenox  Per primary HIV Syncope Abnormal LFTs  I reviewed Consultant infectious disease notes, hospitalist notes, last 24 h vitals and pain scores, last 48 h intake and output, last 24 h labs and trends, and last 24 h imaging results.   LOS: 3 days   Eric Form, [facility] 12/20/2021, 1:05 PM Please see Amion for pager number during day hours  7:00am-4:30pm

## 2021-12-20 NOTE — Consult Note (Addendum)
[facility]    Date of Admission:  12/16/2021   Total days of inpatient antibiotics 4        Reason for Consult: Abdominal abscess    Principal Problem:   Cellulitis of abdominal wall Active Problems:   HIV disease (HCC)   Syncope   Hyponatremia   Hypokalemia   Cellulitis   Assessment: 41 year old male admitted with:  #Abdominal wall cellulitis #HIV/AIDS #History of intermittent adherence to ART -CT abdomen pelvis showed mild circumferential thickening of the rectum with mild perirectal haziness, possible proctitis. Inflammatory changes of the skin and subcu soft tissue of the left anterior pelvic wall suspicious for cellulitis. Abdominal ultrasound showed biliary sludge without cholecystitis -General surgery was consulted and on 8/23 patient underwent debridement at bedside of abdominal wall cellulitis/abscess.  Noted to have purulent drainage.  No culture sent.   - CD4 149, viral load pending. Reports missing ART about 3x week Recommendations:  - D/C vancomycin cefepime - Start doxycycline and Augmentin, plan for 2 weeks of antibiotics from debridement(EOT 9/6). - Viral load, RPR and GC urine pending.  - Hold off on starting PJP PPX until G6PD returns.  All three options for PJP PPX can lead to liver injury.  Scheduled appointment with Marcos Eke on 8/30, repeat cd4(in case acute illness is contributing to drop in cd4) and LFTs at that point. If cd4  less than <200 then start bactrim.  -Counseled on ART adherence - Follow blood cultures - Continue our regimen with Symtuza and Tivicay - Follow-up in ID clinic  #Hepatitis B infection #Levated LFT I suspect that acute illness may have lead to increase to Hep B viral replication and elevated LFTs. Repeat LFTs at ID visit as above Recommendation -ultaquant and HBV ab ordered -Pt given follow-up appointment with [name] Calone(pt requested) at [facility] on  8/30(repeat Lfts)  I spent more than  81 minutes for this patient encounter including reviewing data/chart, and coordinating care and >50% direct face to face time providing counseling/discussing diagnostics/treatment plan with patient  Microbiology:   Antibiotics: Vancomycin 8/21-p Cefepime 8/23-p  Metronidazole 8/21, 8/23-p CTX 8/21-22 Cultures: Blood 8/21 NG    HPI: Daniel Dunlap is a 41 y.o. male with HIV viral load 516, CD4 268 on 04/18/2019 on Symtuza/Tivicay admitted with abdominal cellulitis.  Patient presented with abdominal pain.  He had left lower abdominal pain x2 days.  He had noticed a small bump concerning for folliculitis, he tried to manipulate the area and remove hair without success.  He had progressive swelling and induration in his left upper abdomen and erythema.  He says in the morning he stood up and had lightheadedness but did not lose consciousness.  He presented to the ED on arrival patient afebrile, WBC 11 K.  Blood cultures ordered.  CT abdomen pelvis showed mild circumferential thickening nondirective concerning for proximal Titus and abdominal wall cellulitis.  General surgery was consulted and patient underwent bedside debridement.  Continues on vancomycin cefepime and metronidazole.  ID engaged.   Review of Systems: Review of Systems  All other systems reviewed and are negative.   Past Medical History:  Diagnosis Date   Genital warts    HIV infection (HCC)    HPV (human papilloma virus) infection    Thyroid disease     Social History   Tobacco Use   Smoking status: Every Day    Types: Cigarettes   Smokeless tobacco: Never   Tobacco comments:  1-2 cigarettes a day   Vaping Use   Vaping Use: Never used  Substance Use Topics   Alcohol use: Yes    Alcohol/week: 1.0 standard drink of alcohol    Types: 1 Glasses of wine per week   Drug use: No    Family History  Problem Relation Age of Onset   Healthy Mother    Healthy Father    Scheduled Meds:   Darunavir-Cobicistat-Emtricitabine-Tenofovir Alafenamide  1 tablet Oral Q breakfast   dolutegravir  50 mg Oral Daily   enoxaparin (LOVENOX) injection  40 mg Subcutaneous Q24H   Continuous Infusions:  sodium chloride 75 mL/hr at 12/19/21 2148   ceFEPime (MAXIPIME) IV 2 g (12/20/21 0230)   metronidazole 500 mg (12/20/21 0443)   vancomycin 1,250 mg (12/20/21 0600)   PRN Meds:.acetaminophen **OR** acetaminophen, morphine injection, ondansetron **OR** ondansetron (ZOFRAN) IV, oxyCODONE, senna-docusate No Known Allergies  OBJECTIVE: Blood pressure 115/81, pulse (!) 50, temperature 97.8 F (36.6 C), temperature source Oral, resp. rate 16, height 5\' 10"  (1.778 m), weight 93.3 kg, SpO2 98 %.  Physical Exam Constitutional:      General: He is not in acute distress.    Appearance: He is normal weight. He is not toxic-appearing.  HENT:     Head: Normocephalic and atraumatic.     Right Ear: External ear normal.     Left Ear: External ear normal.     Nose: No congestion or rhinorrhea.     Mouth/Throat:     Mouth: Mucous membranes are moist.     Pharynx: Oropharynx is clear.  Eyes:     Extraocular Movements: Extraocular movements intact.     Conjunctiva/sclera: Conjunctivae normal.     Pupils: Pupils are equal, round, and reactive to light.  Cardiovascular:     Rate and Rhythm: Normal rate and regular rhythm.     Heart sounds: No murmur heard.    No friction rub. No gallop.  Pulmonary:     Effort: Pulmonary effort is normal.     Breath sounds: Normal breath sounds.  Abdominal:     General: Abdomen is flat. Bowel sounds are normal.     Palpations: Abdomen is soft.  Musculoskeletal:        General: No swelling. Normal range of motion.     Cervical back: Normal range of motion and neck supple.  Skin:    General: Skin is warm and dry.  Neurological:     General: No focal deficit present.     Mental Status: He is oriented to person, place, and time.  Psychiatric:        Mood and  Affect: Mood normal.     Lab Results Lab Results  Component Value Date   WBC 2.9 (L) 12/20/2021   HGB 12.9 (L) 12/20/2021   HCT 39.5 12/20/2021   MCV 87.4 12/20/2021   PLT 216 12/20/2021    Lab Results  Component Value Date   CREATININE 1.06 12/20/2021   BUN 10 12/20/2021   NA 137 12/20/2021   K 4.0 12/20/2021   CL 107 12/20/2021   CO2 27 12/20/2021    Lab Results  Component Value Date   ALT 252 (H) 12/20/2021   AST 282 (H) 12/20/2021   ALKPHOS 97 12/20/2021   BILITOT 0.7 12/20/2021       12/22/2021, MD [facility] 12/20/2021, 8:31 AM

## 2021-12-21 ENCOUNTER — Other Ambulatory Visit (HOSPITAL_COMMUNITY): Payer: Self-pay

## 2021-12-21 LAB — RETICULOCYTES
Immature Retic Fract: 5.9 % (ref 2.3–15.9)
RBC.: 4.94 MIL/uL (ref 4.22–5.81)
Retic Count, Absolute: 39 K/uL (ref 19.0–186.0)
Retic Ct Pct: 0.8 % (ref 0.4–3.1)

## 2021-12-21 LAB — CULTURE, BLOOD (ROUTINE X 2)
Culture: NO GROWTH
Culture: NO GROWTH
Special Requests: ADEQUATE
Special Requests: ADEQUATE

## 2021-12-21 LAB — CBC WITH DIFFERENTIAL/PLATELET
Abs Immature Granulocytes: 0.01 10*3/uL (ref 0.00–0.07)
Basophils Absolute: 0 10*3/uL (ref 0.0–0.1)
Basophils Relative: 0 %
Eosinophils Absolute: 0.1 10*3/uL (ref 0.0–0.5)
Eosinophils Relative: 4 %
HCT: 42.1 % (ref 39.0–52.0)
Hemoglobin: 14 g/dL (ref 13.0–17.0)
Immature Granulocytes: 0 %
Lymphocytes Relative: 42 %
Lymphs Abs: 1.5 10*3/uL (ref 0.7–4.0)
MCH: 28.4 pg (ref 26.0–34.0)
MCHC: 33.3 g/dL (ref 30.0–36.0)
MCV: 85.4 fL (ref 80.0–100.0)
Monocytes Absolute: 0.4 10*3/uL (ref 0.1–1.0)
Monocytes Relative: 12 %
Neutro Abs: 1.5 10*3/uL — ABNORMAL LOW (ref 1.7–7.7)
Neutrophils Relative %: 42 %
Platelets: 257 10*3/uL (ref 150–400)
RBC: 4.93 MIL/uL (ref 4.22–5.81)
RDW: 14.6 % (ref 11.5–15.5)
WBC: 3.6 10*3/uL — ABNORMAL LOW (ref 4.0–10.5)
nRBC: 0 % (ref 0.0–0.2)

## 2021-12-21 LAB — IRON AND TIBC
Iron: 97 ug/dL (ref 45–182)
Saturation Ratios: 27 % (ref 17.9–39.5)
TIBC: 367 ug/dL (ref 250–450)
UIBC: 270 ug/dL

## 2021-12-21 LAB — COMPREHENSIVE METABOLIC PANEL
ALT: 327 U/L — ABNORMAL HIGH (ref 0–44)
AST: 387 U/L — ABNORMAL HIGH (ref 15–41)
Albumin: 3.3 g/dL — ABNORMAL LOW (ref 3.5–5.0)
Alkaline Phosphatase: 106 U/L (ref 38–126)
Anion gap: 8 (ref 5–15)
BUN: 13 mg/dL (ref 6–20)
CO2: 25 mmol/L (ref 22–32)
Calcium: 8.6 mg/dL — ABNORMAL LOW (ref 8.9–10.3)
Chloride: 100 mmol/L (ref 98–111)
Creatinine, Ser: 1.11 mg/dL (ref 0.61–1.24)
GFR, Estimated: >60 mL/min (ref >60)
Glucose, Bld: 107 mg/dL — ABNORMAL HIGH (ref 70–99)
Potassium: 3.3 mmol/L — ABNORMAL LOW (ref 3.5–5.1)
Sodium: 133 mmol/L — ABNORMAL LOW (ref 135–145)
Total Bilirubin: 0.8 mg/dL (ref 0.3–1.2)
Total Protein: 8.5 g/dL — ABNORMAL HIGH (ref 6.5–8.1)

## 2021-12-21 LAB — HIV-1 RNA QUANT-NO REFLEX-BLD
HIV 1 RNA Quant: 450 copies/mL
LOG10 HIV-1 RNA: 2.653 log10copy/mL

## 2021-12-21 LAB — MAGNESIUM: Magnesium: 1.9 mg/dL (ref 1.7–2.4)

## 2021-12-21 LAB — PHOSPHORUS: Phosphorus: 3.5 mg/dL (ref 2.5–4.6)

## 2021-12-21 LAB — RPR
RPR Ser Ql: REACTIVE — AB
RPR Titer: 1:4 {titer}

## 2021-12-21 LAB — HEPATITIS B SURFACE ANTIBODY, QUANTITATIVE: Hep B S AB Quant (Post): <3.1 m[IU]/mL — ABNORMAL LOW (ref >9.9)

## 2021-12-21 LAB — FERRITIN: Ferritin: 205 ng/mL (ref 24–336)

## 2021-12-21 LAB — FOLATE: Folate: 14.2 ng/mL (ref >5.9)

## 2021-12-21 LAB — HEPATITIS B DNA, ULTRAQUANTITATIVE, PCR
HBV DNA SERPL PCR-ACNC: 242000 IU/mL
HBV DNA SERPL PCR-LOG IU: 5.384 log10 IU/mL

## 2021-12-21 LAB — VITAMIN B12: Vitamin B-12: 1117 pg/mL — ABNORMAL HIGH (ref 180–914)

## 2021-12-21 MED ORDER — POTASSIUM CHLORIDE CRYS ER 20 MEQ PO TBCR
40.0000 meq | EXTENDED_RELEASE_TABLET | Freq: Two times a day (BID) | ORAL | Status: AC
Start: 2021-12-21 — End: 2021-12-21
  Administered 2021-12-21 (×2): 40 meq via ORAL
  Filled 2021-12-21 (×2): qty 2

## 2021-12-21 MED ORDER — PENICILLIN G BENZATHINE 1200000 UNIT/2ML IM SUSY
2.4000 10*6.[IU] | PREFILLED_SYRINGE | Freq: Once | INTRAMUSCULAR | Status: AC
Start: 2021-12-21 — End: 2021-12-21
  Administered 2021-12-21: 2.4 10*6.[IU] via INTRAMUSCULAR
  Filled 2021-12-21: qty 4

## 2021-12-21 NOTE — Progress Notes (Signed)
Pharmacy: [facility]  [facility] pharmacy filled his Symtuza & Tivcay 12/21/2021  Meds are in [facility] main pharmacy & should be sent home w/ pt at discharge  Herby Abraham, Pharm.D 12/21/2021 3:39 PM

## 2021-12-21 NOTE — Progress Notes (Signed)
UNASSIGNED PATIENT Subjective: Patient is a 41 year old black male with history of HIV on Symtuza/Tivicay who presented with left lower quadrant pain with diagnosed with cellulitis and induration he has had incision and drainage and has wound packing done and seems to be doing very well since then he denies having abdominal pain nausea vomiting at this time his LFTs have been slowly rising which has been a concern but the exact reason for this is unclear.  Hepatitis B surface antigen is reactive.  He denies having any right upper quadrant pain or previous history of liver disease.  A CT scan of the abdomen pelvis CT done to evaluate his abdominal wall induration also showed some perirectal wall thickening but he denies any diarrhea or perirectal complaints at this time he is on IV antibiotics and seems to be doing well.  Objective: Vital signs in last 24 hours: Temp:  [97.9 F (36.6 C)-98.2 F (36.8 C)] 98.2 F (36.8 C) (08/26 1411) Pulse Rate:  [54-55] 55 (08/26 1411) Resp:  [18-20] 20 (08/26 1411) BP: (122-138)/(81-95) 122/81 (08/26 1411) SpO2:  [97 %-99 %] 98 % (08/26 1411) Last BM Date : 12/18/21  Intake/Output from previous day: 08/25 0701 - 08/26 0700 In: 120 [P.O.:120] Out: -  Intake/Output this shift: No intake/output data recorded.  General appearance: alert, cooperative, appears stated age, and no distress Resp: clear to auscultation bilaterally Cardio: regular rate and rhythm, S1, S2 normal, no murmur, click, rub or gallop GI: soft, non-tender; bowel sounds normal; no masses,  no organomegaly  Lab Results: Recent Labs    12/19/21 0437 12/20/21 0454 12/21/21 0517  WBC 3.5* 2.9* 3.6*  HGB 12.7* 12.9* 14.0  HCT 38.6* 39.5 42.1  PLT 213 216 257   BMET Recent Labs    12/19/21 0437 12/20/21 0454 12/21/21 0517  NA 138 137 133*  K 4.0 4.0 3.3*  CL 108 107 100  CO2 25 27 25   GLUCOSE 98 102* 107*  BUN 13 10 13   CREATININE 1.08 1.06 1.11  CALCIUM 8.5* 8.5* 8.6*    LFT Recent Labs    12/21/21 0517  PROT 8.5*  ALBUMIN 3.3*  AST 387*  ALT 327*  ALKPHOS 106  BILITOT 0.8   PT/INR No results for input(s): "LABPROT", "INR" in the last 72 hours. Hepatitis Panel Recent Labs    12/19/21 0437  HEPBSAG Reactive*  HCVAB NON REACTIVE  HEPAIGM NON REACTIVE  HEPBIGM NON REACTIVE   Studies/Results: No results found.  Medications: I have reviewed the patient's current medications. Prior to Admission:  Medications Prior to Admission  Medication Sig Dispense Refill Last Dose   [DISCONTINUED] Darunavir-Cobicistat-Emtricitabine-Tenofovir Alafenamide (SYMTUZA) 800-150-200-10 MG TABS Take 1 tablet by mouth daily with breakfast. 30 tablet 3 12/15/2021   [DISCONTINUED] dolutegravir (TIVICAY) 50 MG tablet Take 1 tablet (50 mg total) by mouth daily. 30 tablet 3 12/15/2021   Scheduled:  amoxicillin-clavulanate  1 tablet Oral Q12H   Darunavir-Cobicistat-Emtricitabine-Tenofovir Alafenamide  1 tablet Oral Q breakfast   dolutegravir  50 mg Oral Daily   doxycycline  100 mg Oral Q12H   enoxaparin (LOVENOX) injection  40 mg Subcutaneous Q24H   potassium chloride  40 mEq Oral BID   Continuous:  sodium chloride 75 mL/hr at 12/19/21 2148    Assessment/Plan: 1) Cellulitis of the anterior abdominal wall with an abscess status post I&D with packing of the wound on broad-spectrum antibiotics. 2) Chronic HBV infection-recent elevation  of liver enzymes with a positive Hepatitis B surface antigen. Right upper quadrant ultrasound  shows some sludge with no evidence of cholecystitis and the gallbladder is mildly dilated.  Acute hepatitis panel is otherwise negative except for the hepatitis B surface antigen as mentioned above.  We will continue to monitor his LFTs closely. 3) Abnormal CT scan of the abdomen and pelvis ??proctitis-he will need a colonoscopy in the near future.   LOS: 4 days   Charna Elizabeth 12/21/2021, 2:52 PM

## 2021-12-21 NOTE — Progress Notes (Signed)
PROGRESS NOTE    Daniel Dunlap  DUK:[id number] DOB: [birth year] DOA: 12/16/2021 PCP: Pcp, No   Brief Narrative:  Daniel Dunlap is a 41 y.o. male with medical history significant for HIV (RNA 561, CD4 268 on 04/17/2021) on Symtuza/Tivicay who is admitted with left lower abdominal wall pain with concern for cellulitis and induration.  He noticed some swelling to the left lower abdomen approximately 3 to 4 days ago and he noticed a small bump in the area concerning for folliculitis.  He tried to manipulate the area and remove the hair without success and had progressive swelling and induration of his left lower abdomen with erythema.  On the day prior to admission on the morning morning he became lightheaded when he stood up and did lose consciousness.  He did hit his head just above his left eye and states he was out for about 30 minutes and felt diaphoretic prior to passing out.  He denies chest pain or shortness of breath.  When presenting to the ED he was noted to have elevated WBC and a Tmax of 99.9.  Blood cultures were obtained and a HIV RNA and CD4 ordered and a head CT scan was done which was negative for any acute intracranial findings.  CT of the abdomen pelvis was done with contrast that showed mild circumferential thickening of the rectum with mild perirectal haziness but no bowel obstruction and the appendix appeared normal.  There was left anterior pelvic wall inflammatory changes suspicious for cellulitis noted without drainable abscess or fluid collection.  He was given IV vancomycin, ceftriaxone and Flagyl and the hospitalist team was consulted.  His antibiotics were de-escalated to IV ceftriaxone and given slow improvement General surgery was consulted for further evaluation of his induration and cellulitis.  ID was also consulted and GI was also consulted and patient is improving and LFTs are continuing to worsen again so we will observe and monitor overnight and once cleared by GI can be  discharged home.   Assessment and Plan: * Cellulitis of abdominal wall with abscess -Left lower abdominal cellulitis after initial folliculitis.   -Has mild leukocytosis with WBC 11.3 which is now improved to 5.0 yesterday and today is 3.5 -Continued IV ceftriaxone 1 g every 24 hours but surgery recommended broadening antibiotics and added metronidazole 500 mg every 12, and IV vancomycin every 12h this is now been stopped by ID -Continue with pain control and supportive care -Discontinued IV ketorolac -Continue with IV morphine 1 mg every 3 as needed for severe pain along with oxycodone IR 5 mg every 4 as needed for moderate pain -Continue with supportive care and antiemetics with ondansetron 4 mg p.o./IV every 6 as needed for nausea  -Surgery consulted for further evaluation in the area of fluctuance on exam was consistent with abscess and they did a bedside I&D with had copious purulence expressed; his abscess cavity was packed with iodoform gauze and he feels much better now -The erythema and induration is significantly improved and packing was recommended to be remaining today and replaced tomorrow and he will follow-up with the general surgery clinic after discharge -Unfortunately no cultures were sent when the patient's abscess was drained so I have consulted ID for further antibiotic recommendations -ID evaluated and have stopped his antibiotics as above and have discontinued vancomycin and cefepime and started him on doxycycline and Augmentin for 2-week course of treatment from debridement with estimated length of treatment finishing 01/01/2022   Questionable proctitis -CT scan mentioned "Mild circumferential  thickening of the rectum with mild perirectal haziness. Clinical  correlation is recommended to evaluate for possibility of proctitis. No bowel obstruction. Normal appendix. Inflammatory changes of the skin and subcutaneous soft tissues of the left anterior pelvic wall suspicious for  cellulitis. No drainable fluid collection or abscess" -Patient denies any symptoms -GI consulted and recommending outpatient colonoscopy at some point   Syncope -History suggestive of orthostatic syncope.   -Reports hitting his head without significant injury.   -CT head negative for acute findings. -Continued IV fluid hydration and will resume IV fluids at 75 MLS per hour with normal saline -Check orthostatic vital signs in the morning -Consider PT OT evaluation but will hold off for now   Hyponatremia -Mild with sodium 127 on admission.  Suspect volume depletion -Improved with IV fluid hydration.  Sodium is now gone from 127 -> 133 -> 138 -> 137 -> 133 -Continue to monitor and trend and repeat CMP in a.m.   Abnormal LFTs -Unclear etiology -AST has trended up to 160 and ALT was 192; now continuing to worsen AST is worse  and is 387 and ALT is 327 -Right upper quadrant ultrasound done and showed "Biliary sludge without evidence of cholecystitis. Gallbladder is mildly dilated" -Acute hepatitis panel is negative but hepatitis B surface antigen is pending -Continue monitor and trend liver function carefully and repeat CMP in the a.m. -Because LFTs went up GI has been consulted for further evaluation and recommendations and Dr. Elnoria HowardHung feels that this could be medication induced but recommends continue to monitor LFTs and feels at some point he will need a colonoscopy to further evaluate his CT findings of possible proctitis despite him having any symptoms -HBV DNA was 242,000, HBV DNA SERPL PCR Log IU was 5.384 and HB S AB Quant was <3.1 -Infectious disease suspects that acute illness may have led to increase in his hepatitis B viral load location and elevated LFTs and they are recommending continue follow-up carefully and I have ordered a ultra quant and HBV antibody  ? Syphilis -RPR was Reactive -ID recommending Penicllin 24 million Units IM -RPR titer came back later and showed a Titer of  1:4  Hypoalbuminemia -Patient's albumin level is now 3.0 -Continue to monitor and trend and repeat CMP in the a.m.   Hypokalemia -Potassium is now 3.3 again -Checked Mag Level and was 1.9 -Replete with p.o. KCl 40 mg twice daily x2 doses -Continue to Monitor and Trend and Replete as Necessary -Repeat CMP in the AM   HIV disease (HCC) -CD4 was 149 -Continue home Antivirals with Symtuza and Dolutegravir 50 mg p.o. daily -Infectious disease was consulted and they are recommending holding off starting PJP prophylaxis until G6PD returns given that all 3 options for the PJP prophylaxis can lead to liver injury -Patient has a scheduled appointment Marcos EkeGreg Calone on 12/25/2021 for repeat CD4 in case that his acute illness is contributing to drop in CD4 but if CD4 count is less than 200 there we will start Bactrim in outpatient setting   Normocytic Anemia -Patient's hemoglobin/hematocrit has dropped from 14.8/43.7 -> 13.4/40.3 -> 13.1/40.0 -> 12.7/38.6 -> 12.9/39.5 -> 14.0/42.1  -Checked Anemia Panel showed an iron level of 97, U IBC of 270, TIBC of 367, saturation ratio 27%, ferritin level of 205, folate of 14.2, vitamin B12 1117 -Continue to monitor for signs and symptoms; No overt bleeding noted -Repeat CBC in the a.m.   Overweight  -Complicates overall prognosis and care -Estimated body mass index is 29.51 kg/m  as calculated from the following:   Height as of this encounter: 5\' 10"  (1.778 m).   Weight as of this encounter: 93.3 kg.  -Weight Loss and Dietary Counseling given  DVT prophylaxis: enoxaparin (LOVENOX) injection 40 mg Start: 12/17/21 1000    Code Status: Full Code Family Communication: No family currently at bedside  Disposition Plan:  Level of care: Med-Surg Status is: Inpatient Remains inpatient appropriate because: Seen and examined at bedside and states he is feeling much better.  Concerned about his LFTs continue to climb.  No lightheadedness or dizziness.  Denies any  chest pain or shortness of breath.   Consultants:  Infectious diseases General surgery Gastroenterology  Procedures:  I and D RUQ U/S   Antimicrobials:  Anti-infectives (From admission, onward)    Start     Dose/Rate Route Frequency Ordered Stop   12/21/21 0930  penicillin g benzathine (BICILLIN LA) 1200000 UNIT/2ML injection 2.4 Million Units        2.4 Million Units Intramuscular  Once 12/21/21 0842 12/21/21 1044   12/20/21 1400  sulfamethoxazole-trimethoprim (BACTRIM DS) 800-160 MG per tablet 1 tablet  Status:  Discontinued        1 tablet Oral Every 12 hours 12/20/21 1029 12/20/21 1133   12/20/21 1400  amoxicillin-clavulanate (AUGMENTIN) 875-125 MG per tablet 1 tablet        1 tablet Oral Every 12 hours 12/20/21 1029 01/01/22 2359   12/20/21 1400  doxycycline (VIBRA-TABS) tablet 100 mg        100 mg Oral Every 12 hours 12/20/21 1133 01/01/22 2359   12/20/21 0000  Darunavir-Cobicistat-Emtricitabine-Tenofovir Alafenamide (SYMTUZA) 800-150-200-10 MG TABS       Note to Pharmacy: Copay card to be added in [place] [place] [facility], PharmD   1 tablet Oral Daily with breakfast 12/20/21 1314     12/20/21 0000  dolutegravir (TIVICAY) 50 MG tablet       Note to Pharmacy: Copay card to be added in [facility]   50 mg Oral Daily 12/20/21 1314     12/20/21 0000  amoxicillin-clavulanate (AUGMENTIN) 875-125 MG tablet        1 tablet Oral Every 12 hours 12/20/21 1314 01/02/22 2359   12/20/21 0000  doxycycline (VIBRA-TABS) 100 MG tablet       Note to Pharmacy: Meds to Beds please if possible.   100 mg Oral Every 12 hours 12/20/21 1314 01/02/22 2359   12/18/21 1800  ceFEPIme (MAXIPIME) 2 g in sodium chloride 0.9 % 100 mL IVPB  Status:  Discontinued        2 g 200 mL/hr over 30 Minutes Intravenous Every 8 hours 12/18/21 1625 12/20/21 1029   12/18/21 1800  metroNIDAZOLE (FLAGYL) IVPB 500 mg  Status:  Discontinued        500 mg 100 mL/hr over 60 Minutes Intravenous Every 12 hours 12/18/21 1625 12/20/21  1029   12/18/21 1800  vancomycin (VANCOREADY) IVPB 1250 mg/250 mL  Status:  Discontinued        1,250 mg 166.7 mL/hr over 90 Minutes Intravenous Every 12 hours 12/18/21 1626 12/20/21 1029   12/17/21 2200  cefTRIAXone (ROCEPHIN) 1 g in sodium chloride 0.9 % 100 mL IVPB  Status:  Discontinued        1 g 200 mL/hr over 30 Minutes Intravenous Every 24 hours 12/16/21 2257 12/18/21 1624   12/17/21 1000  dolutegravir (TIVICAY) tablet 50 mg        50 mg Oral Daily 12/16/21 2301  12/17/21 0800  Darunavir-Cobicistat-Emtricitabine-Tenofovir Alafenamide (SYMTUZA) 800-150-200-10 MG TABS 1 tablet        1 tablet Oral Daily with breakfast 12/16/21 2301     12/16/21 2115  vancomycin (VANCOREADY) IVPB 1750 mg/350 mL        1,750 mg 175 mL/hr over 120 Minutes Intravenous  Once 12/16/21 2100 12/17/21 0708   12/16/21 2115  cefTRIAXone (ROCEPHIN) 2 g in sodium chloride 0.9 % 100 mL IVPB        2 g 200 mL/hr over 30 Minutes Intravenous  Once 12/16/21 2110 12/16/21 2215   12/16/21 2115  metroNIDAZOLE (FLAGYL) IVPB 500 mg        500 mg 100 mL/hr over 60 Minutes Intravenous  Once 12/16/21 2110 12/17/21 0708       Subjective: Seen and examined at bedside and he was doing fairly well and or any chest pain or shortness of breath.  Concerned about his liver functions continued to rise.  Otherwise thinks his abdomen is doing better and states that abdominal pain is much improved.  No other concerns or complaints at this time and hoping to go home soon.  Objective: Vitals:   12/20/21 2119 12/21/21 0431 12/21/21 0431 12/21/21 1411  BP: 138/89 (!) 127/95 (!) 127/95 122/81  Pulse: (!) 54 (!) 54 (!) 54 (!) 55  Resp: 18 18  20   Temp: 98.2 F (36.8 C) 97.9 F (36.6 C) 97.9 F (36.6 C) 98.2 F (36.8 C)  TempSrc: Oral Oral Oral Oral  SpO2: 97% 99% 99% 98%  Weight:      Height:        Intake/Output Summary (Last 24 hours) at 12/21/2021 1616 Last data filed at 12/21/2021 1000 Gross per 24 hour  Intake 120 ml   Output 0 ml  Net 120 ml   Filed Weights   12/16/21 1718  Weight: 93.3 kg   Examination: Physical Exam:  Constitutional: WN/WD overweight African-American male currently no acute distress sitting at her bedside appears calm Respiratory: Diminished to auscultation bilaterally, no wheezing, rales, rhonchi or crackles. Normal respiratory effort and patient is not tachypenic. No accessory muscle use.  Cardiovascular: RRR, no murmurs / rubs / gallops. S1 and S2 auscultated. No extremity edema. Abdomen: Soft, non-tender, stented secondary body habitus and abdominal incision is covered. Bowel sounds positive.  GU: Deferred. Musculoskeletal: No clubbing / cyanosis of digits/nails. No joint deformity upper and lower extremities.  Skin: No rashes, lesions, ulcers on limited skin evaluation. No induration; Warm and dry.  Neurologic: CN 2-12 grossly intact with no focal deficits. Romberg sign and cerebellar reflexes not assessed.  Psychiatric: Normal judgment and insight. Alert and oriented x 3. Normal mood and appropriate affect.   Data Reviewed: I have personally reviewed following labs and imaging studies  CBC: Recent Labs  Lab 12/17/21 0437 12/18/21 0504 12/19/21 0437 12/20/21 0454 12/21/21 0517  WBC 7.2 5.0 3.5* 2.9* 3.6*  NEUTROABS  --   --  2.2 1.4* 1.5*  HGB 13.4 13.1 12.7* 12.9* 14.0  HCT 40.3 40.0 38.6* 39.5 42.1  MCV 86.5 86.2 86.4 87.4 85.4  PLT 179 176 213 216 257   Basic Metabolic Panel: Recent Labs  Lab 12/17/21 0437 12/18/21 0504 12/19/21 0437 12/20/21 0454 12/21/21 0517  NA 131* 133* 138 137 133*  K 3.5 3.4* 4.0 4.0 3.3*  CL 100 100 108 107 100  CO2 26 27 25 27 25   GLUCOSE 96 98 98 102* 107*  BUN 14 19 13 10 13   CREATININE 0.96  1.11 1.08 1.06 1.11  CALCIUM 8.3* 8.3* 8.5* 8.5* 8.6*  MG 1.9 2.2 2.0 1.9 1.9  PHOS  --  4.1 3.4 3.2 3.5   GFR: Estimated Creatinine Clearance: 100.5 mL/min (by C-G formula based on SCr of 1.11 mg/dL). Liver Function  Tests: Recent Labs  Lab 12/18/21 0504 12/19/21 0437 12/20/21 0454 12/21/21 0517  AST 160* 208* 282* 387*  ALT 192* 204* 252* 327*  ALKPHOS 74 95 97 106  BILITOT 0.8 1.0 0.7 0.8  PROT 7.7 7.4 7.7 8.5*  ALBUMIN 2.9* 2.8* 3.0* 3.3*   No results for input(s): "LIPASE", "AMYLASE" in the last 168 hours. No results for input(s): "AMMONIA" in the last 168 hours. Coagulation Profile: No results for input(s): "INR", "PROTIME" in the last 168 hours. Cardiac Enzymes: No results for input(s): "CKTOTAL", "CKMB", "CKMBINDEX", "TROPONINI" in the last 168 hours. BNP (last 3 results) No results for input(s): "PROBNP" in the last 8760 hours. HbA1C: No results for input(s): "HGBA1C" in the last 72 hours. CBG: Recent Labs  Lab 12/16/21 1802  GLUCAP 126*   Lipid Profile: No results for input(s): "CHOL", "HDL", "LDLCALC", "TRIG", "CHOLHDL", "LDLDIRECT" in the last 72 hours. Thyroid Function Tests: No results for input(s): "TSH", "T4TOTAL", "FREET4", "T3FREE", "THYROIDAB" in the last 72 hours. Anemia Panel: Recent Labs    12/21/21 0517  VITAMINB12 1,117*  FOLATE 14.2  FERRITIN 205  TIBC 367  IRON 97  RETICCTPCT 0.8   Sepsis Labs: Recent Labs  Lab 12/16/21 2025  LATICACIDVEN 1.4    Recent Results (from the past 240 hour(s))  Blood culture (routine x 2)     Status: None   Collection Time: 12/16/21  9:51 PM   Specimen: BLOOD  Result Value Ref Range Status   Specimen Description   Final    BLOOD RIGHT ANTECUBITAL Performed at [facility], [address], [place], [place] [address]    Special Requests   Final    BOTTLES DRAWN AEROBIC AND ANAEROBIC Blood Culture adequate volume Performed at [facility], [address] [place], [place] [address]    Culture   Final    NO GROWTH 5 DAYS Performed at [facility], [address], [place], [place] [address]    Report Status 12/21/2021 FINAL  Final  Blood culture (routine x 2)      Status: None   Collection Time: 12/16/21  9:52 PM   Specimen: BLOOD  Result Value Ref Range Status   Specimen Description   Final    BLOOD BLOOD RIGHT FOREARM Performed at [facility], [address], [place], [place] [address]    Special Requests   Final    BOTTLES DRAWN AEROBIC AND ANAEROBIC Blood Culture adequate volume Performed at [facility], [address] [place], [place] [address]    Culture   Final    NO GROWTH 5 DAYS Performed at [facility], [address] [place], [place] [address]    Report Status 12/21/2021 FINAL  Final     Radiology Studies: No results found.  Scheduled Meds:  amoxicillin-clavulanate  1 tablet Oral Q12H   Darunavir-Cobicistat-Emtricitabine-Tenofovir Alafenamide  1 tablet Oral Q breakfast   dolutegravir  50 mg Oral Daily   doxycycline  100 mg Oral Q12H   enoxaparin (LOVENOX) injection  40 mg Subcutaneous Q24H   potassium chloride  40 mEq Oral BID   Continuous Infusions:  sodium chloride 75 mL/hr at 12/19/21 2148    LOS:  4 days   Marguerita Merles, DO [facility] Available via Epic secure chat 7am-7pm After these hours, please refer to coverage provider listed on [email or web address] 12/21/2021, 4:16 PM

## 2021-12-22 LAB — CBC WITH DIFFERENTIAL/PLATELET
Abs Immature Granulocytes: 0.03 10*3/uL (ref 0.00–0.07)
Basophils Absolute: 0 10*3/uL (ref 0.0–0.1)
Basophils Relative: 0 %
Eosinophils Absolute: 0.2 10*3/uL (ref 0.0–0.5)
Eosinophils Relative: 4 %
HCT: 40.4 % (ref 39.0–52.0)
Hemoglobin: 13.2 g/dL (ref 13.0–17.0)
Immature Granulocytes: 1 %
Lymphocytes Relative: 31 %
Lymphs Abs: 1.2 10*3/uL (ref 0.7–4.0)
MCH: 28.1 pg (ref 26.0–34.0)
MCHC: 32.7 g/dL (ref 30.0–36.0)
MCV: 86.1 fL (ref 80.0–100.0)
Monocytes Absolute: 0.5 10*3/uL (ref 0.1–1.0)
Monocytes Relative: 13 %
Neutro Abs: 2 10*3/uL (ref 1.7–7.7)
Neutrophils Relative %: 51 %
Platelets: 256 10*3/uL (ref 150–400)
RBC: 4.69 MIL/uL (ref 4.22–5.81)
RDW: 14.7 % (ref 11.5–15.5)
WBC: 3.9 10*3/uL — ABNORMAL LOW (ref 4.0–10.5)
nRBC: 0 % (ref 0.0–0.2)

## 2021-12-22 LAB — COMPREHENSIVE METABOLIC PANEL
ALT: 361 U/L — ABNORMAL HIGH (ref 0–44)
AST: 384 U/L — ABNORMAL HIGH (ref 15–41)
Albumin: 3.4 g/dL — ABNORMAL LOW (ref 3.5–5.0)
Alkaline Phosphatase: 97 U/L (ref 38–126)
Anion gap: 5 (ref 5–15)
BUN: 13 mg/dL (ref 6–20)
CO2: 27 mmol/L (ref 22–32)
Calcium: 8.9 mg/dL (ref 8.9–10.3)
Chloride: 103 mmol/L (ref 98–111)
Creatinine, Ser: 1.04 mg/dL (ref 0.61–1.24)
GFR, Estimated: >60 mL/min (ref >60)
Glucose, Bld: 100 mg/dL — ABNORMAL HIGH (ref 70–99)
Potassium: 4.3 mmol/L (ref 3.5–5.1)
Sodium: 135 mmol/L (ref 135–145)
Total Bilirubin: 0.5 mg/dL (ref 0.3–1.2)
Total Protein: 8.4 g/dL — ABNORMAL HIGH (ref 6.5–8.1)

## 2021-12-22 LAB — MAGNESIUM: Magnesium: 2.2 mg/dL (ref 1.7–2.4)

## 2021-12-22 LAB — PHOSPHORUS: Phosphorus: 3.6 mg/dL (ref 2.5–4.6)

## 2021-12-22 MED ORDER — ONDANSETRON HCL 4 MG PO TABS
4.0000 mg | ORAL_TABLET | Freq: Four times a day (QID) | ORAL | 0 refills | Status: DC | PRN
  Filled 2021-12-22: qty 20, 5d supply, fill #0

## 2021-12-22 MED ORDER — OXYCODONE HCL 5 MG PO TABS
5.0000 mg | ORAL_TABLET | Freq: Four times a day (QID) | ORAL | 0 refills | Status: DC | PRN
  Filled 2021-12-22: qty 10, 3d supply, fill #0

## 2021-12-22 MED ORDER — SENNOSIDES-DOCUSATE SODIUM 8.6-50 MG PO TABS
1.0000 | ORAL_TABLET | Freq: Every evening | ORAL | 0 refills | Status: DC | PRN
  Filled 2021-12-22: qty 30, 30d supply, fill #0

## 2021-12-22 NOTE — Plan of Care (Signed)
  Problem: Education: Goal: Knowledge of General Education information will improve Description: Including pain rating scale, medication(s)/side effects and non-pharmacologic comfort measures Outcome: Adequate for Discharge   Problem: Health Behavior/Discharge Planning: Goal: Ability to manage health-related needs will improve Outcome: Adequate for Discharge   Problem: Clinical Measurements: Goal: Ability to maintain clinical measurements within normal limits will improve Outcome: Adequate for Discharge Goal: Will remain free from infection Outcome: Adequate for Discharge Goal: Diagnostic test results will improve Outcome: Adequate for Discharge Goal: Respiratory complications will improve Outcome: Adequate for Discharge Goal: Cardiovascular complication will be avoided Outcome: Adequate for Discharge   Problem: Activity: Goal: Risk for activity intolerance will decrease Outcome: Adequate for Discharge   Problem: Nutrition: Goal: Adequate nutrition will be maintained Outcome: Adequate for Discharge   Problem: Coping: Goal: Level of anxiety will decrease Outcome: Adequate for Discharge   Problem: Elimination: Goal: Will not experience complications related to bowel motility Outcome: Adequate for Discharge Goal: Will not experience complications related to urinary retention Outcome: Adequate for Discharge   Problem: Pain Managment: Goal: General experience of comfort will improve Outcome: Adequate for Discharge   Problem: Safety: Goal: Ability to remain free from injury will improve Outcome: Adequate for Discharge   Problem: Skin Integrity: Goal: Risk for impaired skin integrity will decrease Outcome: Adequate for Discharge   Problem: Clinical Measurements: Goal: Ability to avoid or minimize complications of infection will improve Outcome: Adequate for Discharge   Problem: Skin Integrity: Goal: Skin integrity will improve Outcome: Adequate for Discharge    Problem: Education: Goal: Knowledge of General Education information will improve Description: Including pain rating scale, medication(s)/side effects and non-pharmacologic comfort measures Outcome: Adequate for Discharge   Problem: Health Behavior/Discharge Planning: Goal: Ability to manage health-related needs will improve Outcome: Adequate for Discharge   Problem: Clinical Measurements: Goal: Ability to maintain clinical measurements within normal limits will improve Outcome: Adequate for Discharge Goal: Will remain free from infection Outcome: Adequate for Discharge Goal: Diagnostic test results will improve Outcome: Adequate for Discharge Goal: Respiratory complications will improve Outcome: Adequate for Discharge Goal: Cardiovascular complication will be avoided Outcome: Adequate for Discharge   Problem: Activity: Goal: Risk for activity intolerance will decrease Outcome: Adequate for Discharge   Problem: Nutrition: Goal: Adequate nutrition will be maintained Outcome: Adequate for Discharge   Problem: Coping: Goal: Level of anxiety will decrease Outcome: Adequate for Discharge   Problem: Elimination: Goal: Will not experience complications related to bowel motility Outcome: Adequate for Discharge Goal: Will not experience complications related to urinary retention Outcome: Adequate for Discharge   Problem: Pain Managment: Goal: General experience of comfort will improve Outcome: Adequate for Discharge   Problem: Safety: Goal: Ability to remain free from injury will improve Outcome: Adequate for Discharge   Problem: Skin Integrity: Goal: Risk for impaired skin integrity will decrease Outcome: Adequate for Discharge

## 2021-12-22 NOTE — Discharge Summary (Signed)
Physician Discharge Summary   Patient: Daniel Dunlap MRN: [id number] DOB: [birth year]  Admit date:     12/16/2021  Discharge date: 12/22/21  Discharge Physician: Marguerita Merles, DO   PCP: Pcp, No   Recommendations at discharge:   Follow-up with PCP within 1 to 2 weeks and repeat CBC, CMP, mag, Phos within 1 week Follow-up with general surgery within 1 to 2 weeks and continue wound care per their protocol Follow-up with infectious diseases within 1 week and appointment has been scheduled for 12/25/2021 with Marcos Eke Follow-up with gastroenterology Dr. Elnoria Howard within 1 week for repeat LFTs and close monitoring of his liver function outpatient evaluation and colonoscopy for proctitis  Discharge Diagnoses: Principal Problem:   Cellulitis of abdominal wall Active Problems:   Syncope   Hyponatremia   Hypokalemia   HIV disease (HCC)   Cellulitis  Resolved Problems:   * No resolved hospital problems. *  Hospital Course: Jeramey Lanuza is a 41 y.o. male with medical history significant for HIV (RNA 561, CD4 268 on 04/17/2021) on Symtuza/Tivicay who is admitted with left lower abdominal wall pain with concern for cellulitis and induration.  He noticed some swelling to the left lower abdomen approximately 3 to 4 days ago and he noticed a small bump in the area concerning for folliculitis.  He tried to manipulate the area and remove the hair without success and had progressive swelling and induration of his left lower abdomen with erythema.  On the day prior to admission on the morning morning he became lightheaded when he stood up and did lose consciousness.  He did hit his head just above his left eye and states he was out for about 30 minutes and felt diaphoretic prior to passing out.  He denies chest pain or shortness of breath.  When presenting to the ED he was noted to have elevated WBC and a Tmax of 99.9.  Blood cultures were obtained and a HIV RNA and CD4 ordered and a head CT scan was done  which was negative for any acute intracranial findings.  CT of the abdomen pelvis was done with contrast that showed mild circumferential thickening of the rectum with mild perirectal haziness but no bowel obstruction and the appendix appeared normal.  There was left anterior pelvic wall inflammatory changes suspicious for cellulitis noted without drainable abscess or fluid collection.  He was given IV vancomycin, ceftriaxone and Flagyl and the hospitalist team was consulted.  His antibiotics were de-escalated to IV ceftriaxone and given slow improvement General surgery was consulted for further evaluation of his induration and cellulitis.  ID was also consulted and GI was also consulted and patient is improving and LFTs are continuing to worsen again but have now stabilized and about the same as yesterday so GI felt that he could be safely discharged home and follow-up closely with outpatient LFTs within 3 days.  Assessment and Plan: * Cellulitis of abdominal wall with abscess -Left lower abdominal cellulitis after initial folliculitis.   -Has mild leukocytosis with WBC 11.3 which is now improved to 5.0 yesterday and today is 3.5 -Continued IV ceftriaxone 1 g every 24 hours but surgery recommended broadening antibiotics and added metronidazole 500 mg every 12, and IV vancomycin every 12h this is now been stopped by ID -Continue with pain control and supportive care -Discontinued IV ketorolac -Continue with IV morphine 1 mg every 3 as needed for severe pain along with oxycodone IR 5 mg every 4 as needed for moderate pain -Continue with  supportive care and antiemetics with ondansetron 4 mg p.o./IV every 6 as needed for nausea  -Surgery consulted for further evaluation in the area of fluctuance on exam was consistent with abscess and they did a bedside I&D with had copious purulence expressed; his abscess cavity was packed with iodoform gauze and he feels much better now -The erythema and induration is  significantly improved and packing was recommended to be remaining today and replaced tomorrow and he will follow-up with the general surgery clinic after discharge -Unfortunately no cultures were sent when the patient's abscess was drained so I have consulted ID for further antibiotic recommendations -ID evaluated and have stopped his antibiotics as above and have discontinued vancomycin and cefepime and started him on doxycycline and Augmentin for 2-week course of treatment from debridement with estimated length of treatment finishing 01/01/2022   Questionable proctitis -CT scan mentioned "Mild circumferential thickening of the rectum with mild perirectal haziness. Clinical  correlation is recommended to evaluate for possibility of proctitis. No bowel obstruction. Normal appendix. Inflammatory changes of the skin and subcutaneous soft tissues of the left anterior pelvic wall suspicious for cellulitis. No drainable fluid collection or abscess" -Patient denies any symptoms -GI consulted and recommending outpatient colonoscopy at some point and this can be done at discharge   Syncope -History suggestive of orthostatic syncope.   -Reports hitting his head without significant injury.   -CT head negative for acute findings. -Continued IV fluid hydration and will resume IV fluids at 75 MLS per hour with normal saline -Check orthostatic vital signs in the morning -Consider PT OT evaluation but will hold off for now as he did well and did not feel dizzy   Hyponatremia -Mild with sodium 127 on admission.  Suspect volume depletion -Improved with IV fluid hydration.  Sodium is now gone from 127 -> 133 -> 138 -> 137 -> 133 and at the time of discharge is 135 -Continue to monitor and trend and repeat CMP in a.m.   Abnormal LFTs -Unclear etiology -AST has trended up to 160 and ALT was 192; now continuing to worsen AST is worse  and is 387 yesterday and today is 384 and ALT is 327 yesterday and is now  361 -Right upper quadrant ultrasound done and showed "Biliary sludge without evidence of cholecystitis. Gallbladder is mildly dilated" -Acute hepatitis panel is negative but hepatitis B surface antigen is pending -Continue monitor and trend liver function carefully and repeat CMP in the a.m. -Because LFTs went up GI has been consulted for further evaluation and recommendations and Dr. Elnoria Howard feels that this could be medication induced but recommends continue to monitor LFTs and feels at some point he will need a colonoscopy to further evaluate his CT findings of possible proctitis despite him having any symptoms -HBV DNA was 242,000, HBV DNA SERPL PCR Log IU was 5.384 and HB S AB Quant was <3.1 -Infectious disease suspects that acute illness may have led to increase in his hepatitis B viral load location and elevated LFTs and they are recommending continue follow-up carefully and I have ordered a ultra quant and HBV antibody -GI feels that he can be safely followed up in the outpatient setting with repeat LFTs within 3 days   ? Syphilis -RPR was Reactive -ID recommending Penicllin 24 million Units IM -RPR titer came back later and showed a Titer of 1:4 -Follow-up with ID in 3 days   Hypoalbuminemia -Patient's albumin level is now 3.0 the day before yesterday and yesterday was 3.3  and today is now 3.4 -Continue to monitor and trend and repeat CMP in the a.m.   Hypokalemia -Potassium is now 4.3 -Checked Mag Level and was 2.2 -Continue to Monitor and Trend and Replete as Necessary -Repeat CMP in the AM   HIV disease (HCC) -CD4 was 149 -Continue home Antivirals with Symtuza and Dolutegravir 50 mg p.o. daily -Infectious disease was consulted and they are recommending holding off starting PJP prophylaxis until G6PD returns given that all 3 options for the PJP prophylaxis can lead to liver injury -Patient has a scheduled appointment Marcos Eke on 12/25/2021 for repeat CD4 in case that his acute  illness is contributing to drop in CD4 but if CD4 count is less than 200 there we will start Bactrim in outpatient setting   Normocytic Anemia -Patient's hemoglobin/hematocrit has dropped from 14.8/43.7 -> 13.4/40.3 -> 13.1/40.0 -> 12.7/38.6 -> 12.9/39.5 -> 14.0/42.1 and is now 13.2/40.4 -Checked Anemia Panel showed an iron level of 97, U IBC of 270, TIBC of 367, saturation ratio 27%, ferritin level of 205, folate of 14.2, vitamin B12 1117 -Continue to monitor for signs and symptoms; No overt bleeding noted -Repeat CBC in the a.m.   Overweight  -Complicates overall prognosis and care -Estimated body mass index is 29.51 kg/m as calculated from the following:   Height as of this encounter: 5\' 10"  (1.778 m).   Weight as of this encounter: 93.3 kg.  -Weight Loss and Dietary Counseling given   Consultants: General surgery, infectious diseases, gastroenterology Procedures performed: Right upper quadrant ultrasound and incision and drainage of his abdominal wall abscess Disposition: Home Diet recommendation:  Discharge Diet Orders (From admission, onward)     Start     Ordered   12/22/21 0000  Diet - low sodium heart healthy        12/22/21 1144           Cardiac diet DISCHARGE MEDICATION: Allergies as of 12/22/2021   No Known Allergies      Medication List     TAKE these medications    amoxicillin-clavulanate 875-125 MG tablet Commonly known as: AUGMENTIN Take 1 tablet by mouth every 12 (twelve) hours for 12 days.   doxycycline 100 MG tablet Commonly known as: VIBRA-TABS Take 1 tablet (100 mg total) by mouth every 12 (twelve) hours for 12 days.   ondansetron 4 MG tablet Commonly known as: ZOFRAN Take 1 tablet (4 mg total) by mouth every 6 (six) hours as needed for nausea.   oxyCODONE 5 MG immediate release tablet Commonly known as: Oxy IR/ROXICODONE Take 1 tablet (5 mg total) by mouth every 6 (six) hours as needed for moderate pain.   senna-docusate 8.6-50 MG  tablet Commonly known as: Senokot-S Take 1 tablet by mouth at bedtime as needed for mild constipation.   Symtuza 800-150-200-10 MG Tabs Generic drug: Darunavir-Cobicistat-Emtricitabine-Tenofovir Alafenamide Take 1 tablet by mouth daily with breakfast.   Tivicay 50 MG tablet Generic drug: dolutegravir Take 1 tablet (50 mg total) by mouth daily.               Discharge Care Instructions  (From admission, onward)           Start     Ordered   12/22/21 0000  Discharge wound care:       Comments: Per General Surgery Recc's   12/22/21 1144            Follow-up Information     Maczis, 12/24/21, PA-C. Go to.   Specialty:  General Surgery Why: follow up on 01/09/22 at 3:45 pm. Please arrive 30 minutes early to complete check in process Contact information: [address] [place] [address] [phone]                Discharge Exam: Ceasar Mons Weights   12/16/21 1718  Weight: 93.3 kg   Vitals:   12/21/21 2152 12/22/21 0515  BP: 123/86 122/81  Pulse: (!) 54 (!) 54  Resp: 18 18  Temp: 98.4 F (36.9 C) 97.9 F (36.6 C)  SpO2: 99% 100%   Examination: Physical Exam:  Constitutional: WN/WD overweight African-American male currently no acute distress appears calm Respiratory: Diminished to auscultation bilaterally, no wheezing, rales, rhonchi or crackles. Normal respiratory effort and patient is not tachypenic. No accessory muscle use.  Unlabored breathing Cardiovascular: RRR, no murmurs / rubs / gallops. S1 and S2 auscultated. Abdomen: Soft, non-tender, slightly distended secondary body habitus and his abdominal incision is covered. Bowel sounds positive.  GU: Deferred. Musculoskeletal: No clubbing / cyanosis of digits/nails. No joint deformity upper and lower extremities. Skin: No rashes, lesions, ulcers on limited skin evaluation. No induration; Warm and dry.  Neurologic: CN 2-12 grossly intact with no focal deficits.  Romberg sign and  cerebellar reflexes not assessed.  Psychiatric: Normal judgment and insight. Alert and oriented x 3. Normal mood and appropriate affect.   Condition at discharge: stable  The results of significant diagnostics from this hospitalization (including imaging, microbiology, ancillary and laboratory) are listed below for reference.   Imaging Studies: US Abdomen Limited RUQ (LIVER/GB)  Result Date: 12/18/2021 CLINICAL DATA:  [id number]; abnormal LFTs EXAM: ULTRASOUND ABDOMEN LIMITED RIGHT UPPER QUADRANT COMPARISON:  None Available. FINDINGS: Gallbladder: There is some sludge seen in the dependent mildly enlarged gallbladder. Gallbladder wall measures 2.7 mm and is within normal limits. No pericholecystic fluid. No sonographic Murphy sign noted by sonographer. Common bile duct: Diameter: 2.7 mm Liver: No focal lesion identified. Within normal limits in parenchymal echogenicity. Portal vein is patent on color Doppler imaging with normal direction of blood flow towards the liver. Other: None. IMPRESSION: Biliary sludge without evidence of cholecystitis. Gallbladder is mildly dilated. Electronically Signed   By: Marjo Bicker M.D.   On: 12/18/2021 16:34   CT ABDOMEN PELVIS W CONTRAST  Result Date: 12/16/2021 CLINICAL DATA:  Left lower quadrant abdominal pain. EXAM: CT ABDOMEN AND PELVIS WITH CONTRAST TECHNIQUE: Multidetector CT imaging of the abdomen and pelvis was performed using the standard protocol following bolus administration of intravenous contrast. RADIATION DOSE REDUCTION: This exam was performed according to the departmental dose-optimization program which includes automated exposure control, adjustment of the mA and/or kV according to patient size and/or use of iterative reconstruction technique. CONTRAST:  OMNIPAQUE IOHEXOL 300 MG/ML  SOLN COMPARISON:  None Available. FINDINGS: Lower chest: The visualized lung bases are clear. No intra-abdominal free air or free fluid. Hepatobiliary: Mild fatty  liver. No biliary dilatation. The gallbladder is unremarkable. Pancreas: Unremarkable. No pancreatic ductal dilatation or surrounding inflammatory changes. Spleen: Normal in size without focal abnormality. Adrenals/Urinary Tract: The adrenal glands unremarkable. The kidneys, visualized ureters, and urinary bladder appear unremarkable. Stomach/Bowel: There is mild circumferential thickening of the rectum with mild perirectal haziness. Clinical correlation is recommended to evaluate for possibility of proctitis. There is no bowel obstruction. The appendix is normal. Vascular/Lymphatic: The abdominal aorta and IVC unremarkable. No portal venous gas. Left external iliac chain adenopathy measures 19 mm, likely reactive. Reproductive: The prostate and seminal vesicles are grossly unremarkable.  No pelvic mass. Other: Inflammatory changes of the skin and subcutaneous soft tissues of the left anterior pelvic wall suspicious for cellulitis. No drainable fluid collection or abscess. No soft tissue gas. Musculoskeletal: No acute or significant osseous findings. IMPRESSION: 1. Mild circumferential thickening of the rectum with mild perirectal haziness. Clinical correlation is recommended to evaluate for possibility of proctitis. 2. No bowel obstruction. Normal appendix. 3. Inflammatory changes of the skin and subcutaneous soft tissues of the left anterior pelvic wall suspicious for cellulitis. No drainable fluid collection or abscess. Electronically Signed   By: Elgie Collard M.D.   On: 12/16/2021 20:57   CT HEAD WO CONTRAST ( )  Result Date: 12/16/2021 CLINICAL DATA:  Trauma EXAM: CT HEAD WITHOUT CONTRAST TECHNIQUE: Contiguous axial images were obtained from the base of the skull through the vertex without intravenous contrast. RADIATION DOSE REDUCTION: This exam was performed according to the departmental dose-optimization program which includes automated exposure control, adjustment of the mA and/or kV according to  patient size and/or use of iterative reconstruction technique. COMPARISON:  None Available. FINDINGS: Brain: No acute intracranial findings are seen. There are no signs of bleeding within the cranium. Ventricles are not dilated. There is no focal edema or mass effect. Vascular: Unremarkable. Skull: No fracture is seen in calvarium. Sinuses/Orbits: Unremarkable. Other: None. IMPRESSION: No acute intracranial findings are seen in noncontrast CT brain. Electronically Signed   By: Ernie Avena M.D.   On: 12/16/2021 17:59    Microbiology: Results for orders placed or performed during the hospital encounter of 12/16/21  Blood culture (routine x 2)     Status: None   Collection Time: 12/16/21  9:51 PM   Specimen: BLOOD  Result Value Ref Range Status   Specimen Description   Final    BLOOD RIGHT ANTECUBITAL Performed at [facility], [address], [place], [place] [address]    Special Requests   Final    BOTTLES DRAWN AEROBIC AND ANAEROBIC Blood Culture adequate volume Performed at [facility], [address] [place], [place] [address]    Culture   Final    NO GROWTH 5 DAYS Performed at [facility], [address], [place], [place] [address]    Report Status 12/21/2021 FINAL  Final  Blood culture (routine x 2)     Status: None   Collection Time: 12/16/21  9:52 PM   Specimen: BLOOD  Result Value Ref Range Status   Specimen Description   Final    BLOOD BLOOD RIGHT FOREARM Performed at [facility], [address] [place], [place] [address]    Special Requests   Final    BOTTLES DRAWN AEROBIC AND ANAEROBIC Blood Culture adequate volume Performed at [facility], [address] [place], [place] [address]    Culture   Final    NO GROWTH 5 DAYS Performed at [facility], [address], [place], [place] [address]    Report Status 12/21/2021 FINAL  Final    Labs: CBC: Recent Labs  Lab  12/18/21 0504 12/19/21 0437 12/20/21 0454 12/21/21 0517 12/22/21 0442  WBC 5.0 3.5* 2.9* 3.6* 3.9*  NEUTROABS  --  2.2 1.4* 1.5* 2.0  HGB 13.1 12.7* 12.9* 14.0 13.2  HCT 40.0 38.6* 39.5 42.1 40.4  MCV 86.2 86.4 87.4 85.4 86.1  PLT 176 213 216 257 256   Basic Metabolic Panel: Recent Labs  Lab 12/18/21 0504 12/19/21 0437 12/20/21 0454 12/21/21 0517 12/22/21 0442  NA 133* 138 137 133* 135  K 3.4* 4.0 4.0 3.3* 4.3  CL 100 108 107 100 103  CO2 27 25 27 25 27   GLUCOSE 98 98 102* 107* 100*  BUN 19 13 10 13 13   CREATININE 1.11 1.08 1.06 1.11 1.04  CALCIUM 8.3* 8.5* 8.5* 8.6* 8.9  MG 2.2 2.0 1.9 1.9 2.2  PHOS 4.1 3.4 3.2 3.5 3.6   Liver Function Tests: Recent Labs  Lab 12/18/21 0504 12/19/21 0437 12/20/21 0454 12/21/21 0517 12/22/21 0442  AST 160* 208* 282* 387* 384*  ALT 192* 204* 252* 327* 361*  ALKPHOS 74 95 97 106 97  BILITOT 0.8 1.0 0.7 0.8 0.5  PROT 7.7 7.4 7.7 8.5* 8.4*  ALBUMIN 2.9* 2.8* 3.0* 3.3* 3.4*   CBG: Recent Labs  Lab 12/16/21 1802  GLUCAP 126*    Discharge time spent: greater than 30 minutes.  Signed: Marguerita Merlesmair Hairo Garraway, DO [facility] 12/22/2021

## 2021-12-22 NOTE — Progress Notes (Signed)
UNASSIGNED PATIENT Subjective: Since I last evaluated the patient, there has been essentially no changes in overall condition he denies having abdominal pain, nausea or vomiting.  His LFTs have trended up a little bit more since yesterday with his AST was 384 up from 387 yesterday up from 282 on 12/20/2021 with ALT of 361 up from 327 yesterday was 252 on 12/20/2021 total bili is 0.5 alkaline phosphatase is within normal range.  Objective: Vital signs in last 24 hours: Temp:  [97.9 F (36.6 C)-98.4 F (36.9 C)] 97.9 F (36.6 C) (08/27 0515) Pulse Rate:  [54-55] 54 (08/27 0515) Resp:  [18-20] 18 (08/27 0515) BP: (122-123)/(81-86) 122/81 (08/27 0515) SpO2:  [98 %-100 %] 100 % (08/27 0515) Last BM Date : 12/18/21  Intake/Output from previous day: 08/26 0701 - 08/27 0700 In: 680 [P.O.:680] Out: 0  Intake/Output this shift: Total I/O In: 240 [P.O.:240] Out: -   General appearance: alert, cooperative, and no distress Resp: clear to auscultation bilaterally Cardio: regular rate and rhythm, S1, S2 normal, no murmur, click, rub or gallop GI: soft, non-tender; bowel sounds normal; no masses,  no organomegaly Extremities: extremities normal, atraumatic, no cyanosis or edema  Lab Results: Recent Labs    12/20/21 0454 12/21/21 0517 12/22/21 0442  WBC 2.9* 3.6* 3.9*  HGB 12.9* 14.0 13.2  HCT 39.5 42.1 40.4  PLT 216 257 256   BMET Recent Labs    12/20/21 0454 12/21/21 0517 12/22/21 0442  NA 137 133* 135  K 4.0 3.3* 4.3  CL 107 100 103  CO2 27 25 27   GLUCOSE 102* 107* 100*  BUN 10 13 13   CREATININE 1.06 1.11 1.04  CALCIUM 8.5* 8.6* 8.9   LFT Recent Labs    12/22/21 0442  PROT 8.4*  ALBUMIN 3.4*  AST 384*  ALT 361*  ALKPHOS 97  BILITOT 0.5   Studies/Results: No results found.  Medications: I have reviewed the patient's current medications. Prior to Admission:  Medications Prior to Admission  Medication Sig Dispense Refill Last Dose   [DISCONTINUED]  Darunavir-Cobicistat-Emtricitabine-Tenofovir Alafenamide (SYMTUZA) 800-150-200-10 MG TABS Take 1 tablet by mouth daily with breakfast. 30 tablet 3 12/15/2021   [DISCONTINUED] dolutegravir (TIVICAY) 50 MG tablet Take 1 tablet (50 mg total) by mouth daily. 30 tablet 3 12/15/2021   Scheduled:  amoxicillin-clavulanate  1 tablet Oral Q12H   Darunavir-Cobicistat-Emtricitabine-Tenofovir Alafenamide  1 tablet Oral Q breakfast   dolutegravir  50 mg Oral Daily   doxycycline  100 mg Oral Q12H   enoxaparin (LOVENOX) injection  40 mg Subcutaneous Q24H   Continuous: 12/17/2021 **OR** ondansetron (ZOFRAN) IV, oxyCODONE, senna-docusate  Assessment/Plan: 1) Cellulitis of the anterior abdominal wall with an abscess status post I&D with packing of the wound on broad-spectrum antibiotics. 2) Chronic HBV infection-recent elevation  of liver enzymes. Right upper quadrant ultrasound shows some sludge with no evidence of cholecystitis and the gallbladder is mildly dilated. Acute hepatitis panel is otherwise negative except for the hepatitis B surface antigen as mentioned above.  His LFTs continue to rise reason for this is not clear to me but the patient feels well and denies any complaints. These enzymes may peak a little more before they normalize. I think it safe to discharge the patient today for him to follow-up closely to have this reevaluated within the 3 to 4 days. 3) Abnormal CT scan of the abdomen and pelvis ?proctitis-he will need other evaluation in the future.   LOS: 5 days   12/17/2021 12/22/2021, 10:30 AM

## 2021-12-22 NOTE — Progress Notes (Signed)
Assessment unchanged. Pt verbalized understanding of dc instructions including medications and follow up care and when to call the doctor. Verbalized understanding of dressing change and supplies provided. Discharged to front entrance accompanied by friend.

## 2021-12-23 ENCOUNTER — Other Ambulatory Visit (HOSPITAL_COMMUNITY): Payer: Self-pay

## 2021-12-23 LAB — T.PALLIDUM AB, TOTAL: T Pallidum Abs: REACTIVE — AB

## 2021-12-24 LAB — GLUCOSE 6 PHOSPHATE DEHYDROGENASE
G6PDH: 7.4 U/g{Hb} (ref 3.8–14.2)
Hemoglobin: 13.6 g/dL (ref 13.0–17.7)

## 2021-12-25 ENCOUNTER — Ambulatory Visit (INDEPENDENT_AMBULATORY_CARE_PROVIDER_SITE_OTHER): Payer: 59 | Admitting: Family

## 2021-12-25 ENCOUNTER — Encounter: Payer: Self-pay | Admitting: Family

## 2021-12-25 ENCOUNTER — Other Ambulatory Visit (HOSPITAL_COMMUNITY): Payer: Self-pay

## 2021-12-25 ENCOUNTER — Other Ambulatory Visit: Payer: Self-pay

## 2021-12-25 VITALS — BP 137/85 | HR 98 | Temp 97.8°F | Wt 210.0 lb

## 2021-12-25 DIAGNOSIS — B20 Human immunodeficiency virus [HIV] disease: Secondary | ICD-10-CM

## 2021-12-25 DIAGNOSIS — L03311 Cellulitis of abdominal wall: Secondary | ICD-10-CM

## 2021-12-25 DIAGNOSIS — B181 Chronic viral hepatitis B without delta-agent: Secondary | ICD-10-CM | POA: Diagnosis not present

## 2021-12-25 DIAGNOSIS — A528 Late syphilis, latent: Secondary | ICD-10-CM | POA: Diagnosis not present

## 2021-12-25 MED ORDER — SYMTUZA 800-150-200-10 MG PO TABS
1.0000 | ORAL_TABLET | Freq: Every day | ORAL | 3 refills | Status: DC
  Filled 2021-12-25 – 2022-01-15 (×5): qty 30, 30d supply, fill #0
  Filled 2022-02-04: qty 30, 30d supply, fill #1
  Filled 2022-03-25 – 2022-04-04 (×2): qty 30, 30d supply, fill #2

## 2021-12-25 MED ORDER — TIVICAY 50 MG PO TABS
50.0000 mg | ORAL_TABLET | Freq: Every day | ORAL | 3 refills | Status: DC
  Filled 2021-12-25 – 2022-01-15 (×3): qty 30, 30d supply, fill #0
  Filled 2022-02-04: qty 30, 30d supply, fill #1
  Filled 2022-03-25 – 2022-04-04 (×2): qty 30, 30d supply, fill #2

## 2021-12-25 NOTE — Assessment & Plan Note (Signed)
Daniel Dunlap previous had a titer last year of 1:64 and was treated and then treated again in December 2022 with titer of 1:16. Most recent titer is 1:4 which indicates no need for treatment at this time. Will continue to monitor.

## 2021-12-25 NOTE — Patient Instructions (Addendum)
Nice to see you.  We will check your lab work today.  Continue to take your medication daily as prescribed.  Refills have been sent to the pharmacy.  Plan for follow up in 1 months or sooner if needed with lab work on the same day.  Have a great day and stay safe!  

## 2021-12-25 NOTE — Assessment & Plan Note (Signed)
Demetirus has elevated liver enzymes in the setting of chronic Hepatitis B and recent imaging with no significant findings and Hepatitis B DNA level of 240,000. Would suspect with his less than optimal adherence to his Symtuza the stopping and starting has led to at least a mild flare of his Hepatitis C although cannot rule out possibility of medication side effect. Counseled on importance of not stopping and starting medication due to increased risk. Hepatitis B will be covered with Symtuza for HIV. Check hepatic panel today. If LFTs continue to increase will recheck in 1 week if stable to decreasing will recheck in 1 month.

## 2021-12-25 NOTE — Assessment & Plan Note (Signed)
Daniel Dunlap's wound appears to be healing adequately with no clear evidence of infection. Instructed to complete Augmentin and Doxycycline as prescribed and continue with basic wound care as instructed.

## 2021-12-25 NOTE — Assessment & Plan Note (Signed)
Daniel Dunlap has been taking his Symtuza and Tivicay since being hospitalized. Reviewed lab work and discussed plan of care and educated regarding the importance of taking his medication on a daily basis as he already has some resistance present and goal is to reduce risk of disease progression or complications. Unclear if his drop in CD4 count is reactive or true drop. Recheck lab work today. Continue current dose of Tivicay and Symtuza. Plan for follow up in 1 month or sooner if needed.

## 2021-12-25 NOTE — Progress Notes (Signed)
Brief Narrative   Patient ID: Daniel Dunlap, male    DOB: Aug 25, 1980, 41 y.o.   MRN: [id number]  Daniel Dunlap is a 41 y/o AA male diagnosed with AIDS/HIV in January 2009 with risk factor of MSM. Initial viral load was >100,000 and CD4 count 17. Cumulative Genosure with M184V (lamivudine and emtricitabine) and E138A (prob. Rilpivirine). History of PJP at diagnosis. Coinfected with Hepatitis B. [id number] negative. Entered care at [facility] Stage 3. ART experience with Atripla, Darunavir/ritonavir, Tenofovir, Abacavir, and now Symtuza/Tivicay.    Subjective:    Chief Complaint  Patient presents with   HIV Positive/AIDS   Hepatitis B   Hospitalization Follow-up   Cellulitis    HPI:  Daniel Dunlap is a 41 y.o. male with HIV disease and chronic Hepatitis B last seen on 12/27/20 with poorly controlled virus secondary to continued less than optimal adherence to Tivicay and Symtuza. Viral load was 40,400 and CD4 count 192. Kidney function, liver function and electrolytes were within normal ranges. In the interim he has been hospitalized from 12/16/21 to 12/22/21 with cellulitis with abdominal wall abscess s/p bedside I&D. No cultures were obtained. CT did show questionable proctitis. Viral load was 450 and CD4 count 149. Question if drop of CD4 count was reactionary to infection. PJP prophylaxis held secondary to concerns for liver function elevation.  Liver function also elevated with AST 384 and ALT 381 with Hepatitis B DNA level of 240,000. Liver ultrasound with bilary sludge and no hepatic lesions. He as discharged on broad spectrum coverage with doxycycline and Augmentin to be completed through 12/31/21.   Daniel Dunlap is feeling better since leaving the hospital. He is taking his Symtuza, Tivicay and doxycycline and stopped the Augment secondary to concern it was making his skin turn yellow. Wound is slowly healing with no current discharge or drainage. Performing routine wound care as instructed. Denies fevers,  chills, night sweats, headaches, changes in vision, neck pain/stiffness, nausea, diarrhea, vomiting, abdominal pain, jaundice, scleral icterus or rashes. Condoms offered.    No Known Allergies    Outpatient Medications Prior to Visit  Medication Sig Dispense Refill   doxycycline (VIBRA-TABS) 100 MG tablet Take 1 tablet (100 mg total) by mouth every 12 (twelve) hours for 12 days. 24 tablet 0   ondansetron (ZOFRAN) 4 MG tablet Take 1 tablet (4 mg total) by mouth every 6 (six) hours as needed for nausea. 20 tablet 0   Darunavir-Cobicistat-Emtricitabine-Tenofovir Alafenamide (SYMTUZA) 800-150-200-10 MG TABS Take 1 tablet by mouth daily with breakfast. 30 tablet 0   dolutegravir (TIVICAY) 50 MG tablet Take 1 tablet (50 mg total) by mouth daily. 30 tablet 0   amoxicillin-clavulanate (AUGMENTIN) 875-125 MG tablet Take 1 tablet by mouth every 12 (twelve) hours for 12 days. (Patient not taking: Reported on 12/25/2021) 24 tablet 0   oxyCODONE (OXY IR/ROXICODONE) 5 MG immediate release tablet Take 1 tablet (5 mg total) by mouth every 6 (six) hours as needed for moderate pain. (Patient not taking: Reported on 12/25/2021) 10 tablet 0   senna-docusate (SENOKOT-S) 8.6-50 MG tablet Take 1 tablet by mouth at bedtime as needed for mild constipation. (Patient not taking: Reported on 12/25/2021) 30 tablet 0   No facility-administered medications prior to visit.     Past Medical History:  Diagnosis Date   Genital warts    HIV infection (HCC)    HPV (human papilloma virus) infection    Thyroid disease      History reviewed. No pertinent surgical history.  Review of Systems  Constitutional:  Negative for appetite change, chills, fatigue, fever and unexpected weight change.  Eyes:  Negative for visual disturbance.  Respiratory:  Negative for cough, chest tightness, shortness of breath and wheezing.   Cardiovascular:  Negative for chest pain and leg swelling.  Gastrointestinal:  Negative for abdominal  pain, constipation, diarrhea, nausea and vomiting.  Genitourinary:  Negative for dysuria, flank pain, frequency, genital sores, hematuria and urgency.  Skin:  Positive for wound. Negative for rash.  Allergic/Immunologic: Negative for immunocompromised state.  Neurological:  Negative for dizziness and headaches.      Objective:    BP 137/85   Pulse 98   Temp 97.8 F (36.6 C) (Oral)   Wt 210 lb (95.3 kg)   SpO2 98%   BMI 30.13 kg/m  Nursing note and vital signs reviewed.  Physical Exam Constitutional:      General: He is not in acute distress.    Appearance: He is well-developed.  Eyes:     Conjunctiva/sclera: Conjunctivae normal.  Cardiovascular:     Rate and Rhythm: Normal rate and regular rhythm.     Heart sounds: Normal heart sounds. No murmur heard.    No friction rub. No gallop.  Pulmonary:     Effort: Pulmonary effort is normal. No respiratory distress.     Breath sounds: Normal breath sounds. No wheezing or rales.  Chest:     Chest wall: No tenderness.  Abdominal:     General: Bowel sounds are normal.     Palpations: Abdomen is soft.     Tenderness: There is no abdominal tenderness.  Musculoskeletal:     Cervical back: Neck supple.  Lymphadenopathy:     Cervical: No cervical adenopathy.  Skin:    General: Skin is warm and dry.     Findings: No rash.  Neurological:     Mental Status: He is alert and oriented to person, place, and time.  Psychiatric:        Behavior: Behavior normal.        Thought Content: Thought content normal.        Judgment: Judgment normal.            12/13/2020   11:13 AM 09/11/2020    9:16 AM 08/14/2020    9:34 AM 05/24/2019    3:18 PM 01/25/2019    3:20 PM  Depression screen PHQ 2/9  Decreased Interest 0 0 0 0 0  Down, Depressed, Hopeless 0 0 0 0 0  PHQ - 2 Score 0 0 0 0 0       Assessment & Plan:    Patient Active Problem List   Diagnosis Date Noted   Cellulitis 12/17/2021   Cellulitis of abdominal wall 12/16/2021    Syncope 12/16/2021   Hyponatremia 12/16/2021   Hypokalemia 12/16/2021   Healthcare maintenance 05/24/2019   Rash 10/05/2018   Secondary syphilis 08/16/2018   Pain, dental 08/16/2018   Screening for STDs (sexually transmitted diseases) 04/26/2018   STI (sexually transmitted infection) 04/26/2018   Routine adult health maintenance 09/05/2016   HIV disease (HCC) 07/12/2015   Late latent syphilis 07/12/2015   Chronic hepatitis B (HCC) 07/12/2015   Normocytic anemia 07/12/2015   Genital herpes 07/12/2015   Hyperthyroidism 06/27/2015   Abnormal liver function test 06/27/2015   Pneumocystis jiroveci pneumonia (HCC) 06/27/2015     Problem List Items Addressed This Visit       Digestive   Chronic hepatitis B (HCC)    Demetirus has  elevated liver enzymes in the setting of chronic Hepatitis B and recent imaging with no significant findings and Hepatitis B DNA level of 240,000. Would suspect with his less than optimal adherence to his Symtuza the stopping and starting has led to at least a mild flare of his Hepatitis C although cannot rule out possibility of medication side effect. Counseled on importance of not stopping and starting medication due to increased risk. Hepatitis B will be covered with Symtuza for HIV. Check hepatic panel today. If LFTs continue to increase will recheck in 1 week if stable to decreasing will recheck in 1 month.       Relevant Medications   Darunavir-Cobicistat-Emtricitabine-Tenofovir Alafenamide (SYMTUZA) 800-150-200-10 MG TABS   dolutegravir (TIVICAY) 50 MG tablet   Other Relevant Orders   Basic metabolic panel   Hepatic function panel     Other   HIV disease [facility])    Mr. Grasser has been taking his [place] since being hospitalized. Reviewed lab work and discussed plan of care and educated regarding the importance of taking his medication on a daily basis as he already has some resistance present and goal is to reduce risk of disease progression  or complications. Unclear if his drop in CD4 count is reactive or true drop. Recheck lab work today. Continue current dose of Tivicay and Symtuza. Plan for follow up in 1 month or sooner if needed.       Relevant Medications   Darunavir-Cobicistat-Emtricitabine-Tenofovir Alafenamide (SYMTUZA) 800-150-200-10 MG TABS   dolutegravir (TIVICAY) 50 MG tablet   Other Relevant Orders   T-helper cell (CD4)- ([facility] clinic only)   HIV-1 RNA quant-no reflex-bld   Basic metabolic panel   Late latent syphilis    Demetirus previous had a titer last year of 1:64 and was treated and then treated again in December 2022 with titer of 1:16. Most recent titer is 1:4 which indicates no need for treatment at this time. Will continue to monitor.       Relevant Medications   Darunavir-Cobicistat-Emtricitabine-Tenofovir Alafenamide (SYMTUZA) 800-150-200-10 MG TABS   dolutegravir (TIVICAY) 50 MG tablet   Cellulitis of abdominal wall - Primary    Zeb's wound appears to be healing adequately with no clear evidence of infection. Instructed to complete Augmentin and Doxycycline as prescribed and continue with basic wound care as instructed.         I am having Robbie Alcorn maintain his amoxicillin-clavulanate, doxycycline, ondansetron, senna-docusate, oxyCODONE, Symtuza, and Tivicay.   Meds ordered this encounter  Medications   Darunavir-Cobicistat-Emtricitabine-Tenofovir Alafenamide (SYMTUZA) 800-150-200-10 MG TABS    Sig: Take 1 tablet by mouth daily with breakfast.    Dispense:  30 tablet    Refill:  3    Copay card to be added in [place]- ?'s Jimmy Footman, PharmD    Order Specific Question:   Supervising Provider    Answer:   Carlyle Basques [[id number]]   dolutegravir (TIVICAY) 50 MG tablet    Sig: Take 1 tablet (50 mg total) by mouth daily.    Dispense:  30 tablet    Refill:  3    Copay card to be added in [place]    Order Specific Question:   Supervising Provider    Answer:   Carlyle Basques [[id number]]      Follow-up: Return in about 1 month (around 01/25/2022), or if symptoms worsen or fail to improve.   Terri Piedra, MSN, FNP-C Nurse Practitioner [facility] for Infectious Disease [place]  number: [phone]

## 2021-12-26 LAB — T-HELPER CELL (CD4) - (RCID CLINIC ONLY)
CD4 % Helper T Cell: 13 % — ABNORMAL LOW (ref 33–65)
CD4 T Cell Abs: 137 /uL — ABNORMAL LOW (ref 400–1790)

## 2021-12-29 LAB — BASIC METABOLIC PANEL WITH GFR
BUN: 15 mg/dL (ref 7–25)
CO2: 29 mmol/L (ref 20–32)
Calcium: 9.3 mg/dL (ref 8.6–10.3)
Chloride: 99 mmol/L (ref 98–110)
Creat: 0.99 mg/dL (ref 0.60–1.29)
Glucose, Bld: 79 mg/dL (ref 65–99)
Potassium: 4.3 mmol/L (ref 3.5–5.3)
Sodium: 135 mmol/L (ref 135–146)

## 2021-12-29 LAB — HEPATIC FUNCTION PANEL
AG Ratio: 0.9 (calc) — ABNORMAL LOW (ref 1.0–2.5)
ALT: 334 U/L — ABNORMAL HIGH (ref 9–46)
AST: 237 U/L — ABNORMAL HIGH (ref 10–40)
Albumin: 3.9 g/dL (ref 3.6–5.1)
Alkaline phosphatase (APISO): 115 U/L (ref 36–130)
Bilirubin, Direct: 0.2 mg/dL (ref 0.0–0.2)
Globulin: 4.5 g/dL (calc) — ABNORMAL HIGH (ref 1.9–3.7)
Indirect Bilirubin: 0.2 mg/dL (calc) (ref 0.2–1.2)
Total Bilirubin: 0.4 mg/dL (ref 0.2–1.2)
Total Protein: 8.4 g/dL — ABNORMAL HIGH (ref 6.1–8.1)

## 2021-12-29 LAB — HIV-1 RNA QUANT-NO REFLEX-BLD
HIV 1 RNA Quant: 118 {copies}/mL — ABNORMAL HIGH
HIV-1 RNA Quant, Log: 2.07 {Log_copies}/mL — ABNORMAL HIGH

## 2022-01-01 ENCOUNTER — Other Ambulatory Visit (HOSPITAL_COMMUNITY): Payer: Self-pay

## 2022-01-14 ENCOUNTER — Other Ambulatory Visit (HOSPITAL_COMMUNITY): Payer: Self-pay

## 2022-01-15 ENCOUNTER — Other Ambulatory Visit (HOSPITAL_COMMUNITY): Payer: Self-pay

## 2022-01-16 ENCOUNTER — Other Ambulatory Visit (HOSPITAL_COMMUNITY): Payer: Self-pay

## 2022-02-04 ENCOUNTER — Other Ambulatory Visit (HOSPITAL_COMMUNITY): Payer: Self-pay

## 2022-02-12 ENCOUNTER — Other Ambulatory Visit (HOSPITAL_COMMUNITY): Payer: Self-pay

## 2022-03-05 ENCOUNTER — Other Ambulatory Visit (HOSPITAL_COMMUNITY): Payer: Self-pay

## 2022-03-06 ENCOUNTER — Other Ambulatory Visit (HOSPITAL_COMMUNITY): Payer: Self-pay

## 2022-03-10 ENCOUNTER — Other Ambulatory Visit (HOSPITAL_COMMUNITY): Payer: Self-pay

## 2022-03-25 ENCOUNTER — Other Ambulatory Visit (HOSPITAL_COMMUNITY): Payer: Self-pay

## 2022-03-27 ENCOUNTER — Telehealth: Payer: Self-pay

## 2022-03-27 ENCOUNTER — Ambulatory Visit: Payer: Self-pay | Admitting: Family

## 2022-03-27 NOTE — Telephone Encounter (Signed)
Called patient to reschedule missed appointment from today. Not able to reach him at this time. Left voicemail to reschedule. Juanita Laster, RMA

## 2022-04-04 ENCOUNTER — Other Ambulatory Visit (HOSPITAL_COMMUNITY): Payer: Self-pay

## 2022-04-24 ENCOUNTER — Other Ambulatory Visit (HOSPITAL_COMMUNITY): Payer: Self-pay

## 2022-04-29 ENCOUNTER — Other Ambulatory Visit (HOSPITAL_COMMUNITY): Payer: Self-pay

## 2022-05-01 ENCOUNTER — Other Ambulatory Visit (HOSPITAL_COMMUNITY): Payer: Self-pay

## 2022-06-04 ENCOUNTER — Other Ambulatory Visit (HOSPITAL_COMMUNITY): Payer: Self-pay

## 2022-08-12 ENCOUNTER — Ambulatory Visit (INDEPENDENT_AMBULATORY_CARE_PROVIDER_SITE_OTHER): Payer: 59 | Admitting: Family

## 2022-08-12 ENCOUNTER — Encounter: Payer: Self-pay | Admitting: Family

## 2022-08-12 ENCOUNTER — Other Ambulatory Visit: Payer: Self-pay

## 2022-08-12 ENCOUNTER — Other Ambulatory Visit (HOSPITAL_COMMUNITY): Payer: Self-pay

## 2022-08-12 VITALS — BP 137/84 | HR 69 | Temp 97.9°F | Ht 69.0 in | Wt 218.0 lb

## 2022-08-12 DIAGNOSIS — B181 Chronic viral hepatitis B without delta-agent: Secondary | ICD-10-CM | POA: Diagnosis not present

## 2022-08-12 DIAGNOSIS — Z0000 Encounter for general adult medical examination without abnormal findings: Secondary | ICD-10-CM | POA: Diagnosis not present

## 2022-08-12 DIAGNOSIS — B349 Viral infection, unspecified: Secondary | ICD-10-CM

## 2022-08-12 DIAGNOSIS — B20 Human immunodeficiency virus [HIV] disease: Secondary | ICD-10-CM

## 2022-08-12 MED ORDER — SYMTUZA 800-150-200-10 MG PO TABS
1.0000 | ORAL_TABLET | Freq: Every day | ORAL | 5 refills | Status: DC
  Filled 2022-08-12 – 2022-08-15 (×2): qty 30, 30d supply, fill #0

## 2022-08-12 MED ORDER — LEVOCETIRIZINE DIHYDROCHLORIDE 5 MG PO TABS: 5.0000 mg | ORAL_TABLET | Freq: Every evening | ORAL | 1 refills | Status: DC

## 2022-08-12 MED ORDER — TIVICAY 50 MG PO TABS
50.0000 mg | ORAL_TABLET | Freq: Every day | ORAL | 5 refills | Status: DC
  Filled 2022-08-12 – 2022-08-18 (×2): qty 30, 30d supply, fill #0
  Filled 2022-09-09 – 2022-12-18 (×2): qty 30, 30d supply, fill #1

## 2022-08-12 NOTE — Patient Instructions (Addendum)
Nice to see you.  We will check your lab work today.  Continue to take your medication daily as prescribed.  Refills have been sent to the pharmacy.  Plan for follow up in 1 months or sooner if needed with lab work on the same day.  Have a great day and stay safe!  

## 2022-08-12 NOTE — Progress Notes (Unsigned)
Brief Narrative   Patient ID: Daniel Dunlap, male    DOB: Sep 21, 1980, 42 y.o.   MRN: [id number]  Daniel Dunlap is a 42 y/o AA male diagnosed with AIDS/HIV in January 2009 with risk factor of MSM. Initial viral load was >100,000 and CD4 count 17. Cumulative Genosure with M184V (lamivudine and emtricitabine) and E138A (prob. Rilpivirine). History of PJP at diagnosis. Coinfected with Hepatitis B. [id number] negative. Entered care at [facility] Stage 3. ART experience with Atripla, Darunavir/ritonavir, Tenofovir, Abacavir, and now Symtuza/Tivicay.   Subjective:    Chief Complaint  Patient presents with   Follow-up    Dry cough over 1 week, hoarse at night   HIV Positive/AIDS   Cough   Hepatitis B    HPI:  Daniel Dunlap is a 42 y.o. male with HIV disease and  chronic Hepatitis B last seen on 12/25/21 with adequate adherence and good tolerance to [place] and Tivicay. Viral load was 118 and CD4 count 137. LIver function tests elevated with ALT 334 and AST 237. Renal function and electrolytes within normal ranges. Has missed several follow up appointments and seen today for acute office visit.   Daniel Dunlap has been experiencing a dry cough that has been going on for about 2 weeks that initially started with nasal congestion and a fever at one point. Has not taken any medication for his symptoms and course of symptoms has continued to gradually improve over time.  Has had less than optimal adherence with good tolerance to his Tivicay and Symtuza. 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 Insurance coverage through [employer].  Denies fevers, chills, night sweats, headaches, changes in vision, neck pain/stiffness, nausea, diarrhea, vomiting, lesions or rashes.  Denies abdominal pain, nausea, vomiting, fatigue, fever, scleral icterus or jaundice.   No Known Allergies    Outpatient Medications Prior to Visit  Medication Sig Dispense Refill    Darunavir-Cobicistat-Emtricitabine-Tenofovir Alafenamide (SYMTUZA) 800-150-200-10 MG TABS Take 1 tablet by mouth daily with breakfast. 30 tablet 3   dolutegravir (TIVICAY) 50 MG tablet Take 1 tablet (50 mg total) by mouth daily. 30 tablet 3   ondansetron (ZOFRAN) 4 MG tablet Take 1 tablet (4 mg total) by mouth every 6 (six) hours as needed for nausea. (Patient not taking: Reported on 08/12/2022) 20 tablet 0   senna-docusate (SENOKOT-S) 8.6-50 MG tablet Take 1 tablet by mouth at bedtime as needed for mild constipation. (Patient not taking: Reported on 12/25/2021) 30 tablet 0   oxyCODONE (OXY IR/ROXICODONE) 5 MG immediate release tablet Take 1 tablet (5 mg total) by mouth every 6 (six) hours as needed for moderate pain. (Patient not taking: Reported on 12/25/2021) 10 tablet 0   No facility-administered medications prior to visit.     Past Medical History:  Diagnosis Date   Genital warts    HIV infection    HPV (human papilloma virus) infection    Thyroid disease      History reviewed. No pertinent surgical history.    Review of Systems  Constitutional:  Negative for chills, diaphoresis, fatigue and fever.  Respiratory:  Positive for cough. Negative for chest tightness, shortness of breath and wheezing.   Cardiovascular:  Negative for chest pain.  Gastrointestinal:  Negative for abdominal pain, diarrhea, nausea and vomiting.      Objective:    BP 137/84   Pulse 69   Temp 97.9 F (36.6 C) (Oral)   Ht  (1.753 m)   Wt 218 lb (98.9  kg)   SpO2 96%   BMI 32.19 kg/m  Nursing note and vital signs reviewed.  Physical Exam Constitutional:      General: He is not in acute distress.    Appearance: He is well-developed.  Eyes:     Conjunctiva/sclera: Conjunctivae normal.  Cardiovascular:     Rate and Rhythm: Normal rate and regular rhythm.     Heart sounds: Normal heart sounds. No murmur heard.    No friction rub. No gallop.  Pulmonary:     Effort: Pulmonary effort is normal.  No respiratory distress.     Breath sounds: Normal breath sounds. No wheezing or rales.  Chest:     Chest wall: No tenderness.  Abdominal:     General: Bowel sounds are normal.     Palpations: Abdomen is soft.     Tenderness: There is no abdominal tenderness.  Musculoskeletal:     Cervical back: Neck supple.  Lymphadenopathy:     Cervical: No cervical adenopathy.  Skin:    General: Skin is warm and dry.     Findings: No rash.  Neurological:     Mental Status: He is alert and oriented to person, place, and time.  Psychiatric:        Behavior: Behavior normal.        Thought Content: Thought content normal.        Judgment: Judgment normal.         08/12/2022    3:13 PM 12/13/2020   11:13 AM 09/11/2020    9:16 AM 08/14/2020    9:34 AM 05/24/2019    3:18 PM  Depression screen PHQ 2/9  Decreased Interest 0 0 0 0 0  Down, Depressed, Hopeless 0 0 0 0 0  PHQ - 2 Score 0 0 0 0 0       Assessment & Plan:    Patient Active Problem List   Diagnosis Date Noted   Viral infection 08/13/2022   Cellulitis 12/17/2021   Cellulitis of abdominal wall 12/16/2021   Syncope 12/16/2021   Hyponatremia 12/16/2021   Hypokalemia 12/16/2021   Healthcare maintenance 05/24/2019   Rash 10/05/2018   Secondary syphilis 08/16/2018   Pain, dental 08/16/2018   Screening for STDs (sexually transmitted diseases) 04/26/2018   STI (sexually transmitted infection) 04/26/2018   Routine adult health maintenance 09/05/2016   AIDS 07/12/2015   Late latent syphilis 07/12/2015   Chronic hepatitis B 07/12/2015   Normocytic anemia 07/12/2015   Genital herpes 07/12/2015   Hyperthyroidism 06/27/2015   Abnormal liver function test 06/27/2015   Pneumocystis jiroveci pneumonia 06/27/2015     Problem List Items Addressed This Visit       Digestive   Chronic hepatitis B - Primary    Daniel Dunlap has Hepatitis B e antibody positive virus and previous lab work for poorly controlled virus with elevated liver  enzymes and DNA level. His continued poor adherence to his HIV regimen of Symtuza containing tenofovir is concerning that he is placing himself at risk for Hepatitis B flare. Discussed importance of taking medication daily and consistently. Recheck Hepatitis B lab work and hepatic function. Will need abdominal ultrasound for HCC screening. Continue current dose of Symtuza for HIV.       Relevant Medications   dolutegravir (TIVICAY) 50 MG tablet   Darunavir-Cobicistat-Emtricitabine-Tenofovir Alafenamide (SYMTUZA) 800-150-200-10 MG TABS   Other Relevant Orders   Liver Fibrosis, FibroTest-ActiTest   Hepatitis B DNA, ultraquantitative, PCR   Hepatitis B e antibody   Hepatitis B e  antigen   Hepatitis B surface antigen   Hepatitis B surface antibody,qualitative   Basic metabolic panel (Completed)   Hepatic function panel (Completed)     Other   AIDS    Daniel Dunlap continues to have less than optimal adherence with good tolerance to his ART regimen of Symtuza and Tivicay. Reviewed previous lab work and discussed plan of care. Check lab work including Genotype today. Continue current dose of Tivicay and Symtuza. If CD4 count is low or viral load is present will add PCP prophylaxis. Plan for follow up in 1 month or sooner if needed with lab work on the same day.       Relevant Medications   dolutegravir (TIVICAY) 50 MG tablet   Darunavir-Cobicistat-Emtricitabine-Tenofovir Alafenamide (SYMTUZA) 800-150-200-10 MG TABS   Other Relevant Orders   T-helper cell (CD4)- ([facility] clinic only)   HIV RNA, RTPCR W/R GT (RTI, PI,INT)   Basic metabolic panel (Completed)   Hepatic function panel (Completed)   Healthcare maintenance    Discussed importance of safe sexual practice and condom use. Condoms and STD testing offered.  Decline vaccines.       Viral infection    Daniel Dunlap appears to have signs/symptoms consistent with resolving viral respiratory infection. There is little concern for any post-viral  bacterial infection given continued improvements. Continue over the counter medications as needed for symptom relief and supportive care. Start Xyzal as needed for allergies.       Relevant Medications   dolutegravir (TIVICAY) 50 MG tablet   Darunavir-Cobicistat-Emtricitabine-Tenofovir Alafenamide (SYMTUZA) 800-150-200-10 MG TABS     I have discontinued Daniel Dunlap's oxyCODONE. I am also having him start on levocetirizine. Additionally, I am having him maintain his ondansetron, senna-docusate, Tivicay, and Symtuza.   Meds ordered this encounter  Medications   dolutegravir (TIVICAY) 50 MG tablet    Sig: Take 1 tablet (50 mg total) by mouth daily.    Dispense:  30 tablet    Refill:  5    Order Specific Question:   Supervising Provider    Answer:   Drue Second, CYNTHIA [[id number]]   Darunavir-Cobicistat-Emtricitabine-Tenofovir Alafenamide (SYMTUZA) 800-150-200-10 MG TABS    Sig: Take 1 tablet by mouth daily with breakfast.    Dispense:  30 tablet    Refill:  5    Order Specific Question:   Supervising Provider    Answer:   Drue Second, CYNTHIA [[id number]]   levocetirizine (XYZAL) 5 MG tablet    Sig: Take 1 tablet (5 mg total) by mouth every evening.    Dispense:  30 tablet    Refill:  1    Order Specific Question:   Supervising Provider    Answer:   Judyann Munson [[id number]]     Follow-up: Return in about 1 month (around 09/11/2022), or if symptoms worsen or fail to improve.   Daniel Eke, MSN, FNP-C Nurse Practitioner [facility] for Infectious Disease [facility] Main number: [phone]

## 2022-08-13 ENCOUNTER — Encounter: Payer: Self-pay | Admitting: Family

## 2022-08-13 DIAGNOSIS — B349 Viral infection, unspecified: Secondary | ICD-10-CM | POA: Insufficient documentation

## 2022-08-13 LAB — HEPATIC FUNCTION PANEL
ALT: 11 U/L (ref 9–46)
Indirect Bilirubin: 0.2 mg/dL (calc) (ref 0.2–1.2)
Total Bilirubin: 0.3 mg/dL (ref 0.2–1.2)

## 2022-08-13 NOTE — Assessment & Plan Note (Signed)
Discussed importance of safe sexual practice and condom use. Condoms and STD testing offered.  Decline vaccines.

## 2022-08-13 NOTE — Assessment & Plan Note (Signed)
Pauline has Hepatitis B e antibody positive virus and previous lab work for poorly controlled virus with elevated liver enzymes and DNA level. His continued poor adherence to his HIV regimen of Symtuza containing tenofovir is concerning that he is placing himself at risk for Hepatitis B flare. Discussed importance of taking medication daily and consistently. Recheck Hepatitis B lab work and hepatic function. Will need abdominal ultrasound for HCC screening. Continue current dose of Symtuza for HIV.

## 2022-08-13 NOTE — Assessment & Plan Note (Signed)
Cordarius continues to have less than optimal adherence with good tolerance to his ART regimen of Symtuza and Tivicay. Reviewed previous lab work and discussed plan of care. Check lab work including Genotype today. Continue current dose of Tivicay and Symtuza. If CD4 count is low or viral load is present will add PCP prophylaxis. Plan for follow up in 1 month or sooner if needed with lab work on the same day.

## 2022-08-13 NOTE — Assessment & Plan Note (Signed)
Daniel Dunlap appears to have signs/symptoms consistent with resolving viral respiratory infection. There is little concern for any post-viral bacterial infection given continued improvements. Continue over the counter medications as needed for symptom relief and supportive care. Start Xyzal as needed for allergies.

## 2022-08-14 LAB — T-HELPER CELL (CD4) - (RCID CLINIC ONLY)
CD4 % Helper T Cell: 10 % — ABNORMAL LOW (ref 33–65)
CD4 T Cell Abs: 90 /uL — ABNORMAL LOW (ref 400–1790)

## 2022-08-15 ENCOUNTER — Other Ambulatory Visit: Payer: Self-pay | Admitting: Family

## 2022-08-15 ENCOUNTER — Telehealth: Payer: Self-pay

## 2022-08-15 ENCOUNTER — Other Ambulatory Visit: Payer: Self-pay

## 2022-08-15 ENCOUNTER — Other Ambulatory Visit (HOSPITAL_COMMUNITY): Payer: Self-pay

## 2022-08-15 MED ORDER — ENTECAVIR 0.5 MG PO TABS
1.0000 mg | ORAL_TABLET | Freq: Every day | ORAL | 5 refills | Status: DC
  Filled 2022-08-15: qty 30, 15d supply, fill #0

## 2022-08-15 MED ORDER — PREZCOBIX 800-150 MG PO TABS
1.0000 | ORAL_TABLET | Freq: Every day | ORAL | 5 refills | Status: DC
  Filled 2022-08-15 – 2022-08-18 (×2): qty 30, 30d supply, fill #0
  Filled 2022-09-09 – 2022-12-18 (×2): qty 30, 30d supply, fill #1

## 2022-08-15 MED ORDER — SULFAMETHOXAZOLE-TRIMETHOPRIM 800-160 MG PO TABS
1.0000 | ORAL_TABLET | Freq: Every day | ORAL | 3 refills | Status: DC
  Filled 2022-08-15: qty 30, 30d supply, fill #0

## 2022-08-15 NOTE — Addendum Note (Signed)
Addended by: Jeanine Luz D on: 08/15/2022 02:13 PM   Modules accepted: Orders

## 2022-08-15 NOTE — Progress Notes (Signed)
Symtuza is no longer covered by insurance as an exclusion and will change to Prezcobix and Tivicay. Reviewed at office visit importance of taking medication regularly and not sporadically which increases the risk of resistance.

## 2022-08-15 NOTE — Telephone Encounter (Signed)
[facility] Patient Advocate Encounter   Received notification from [facility] that prior authorization for Aymtuza is required.   PA submitted on 08/15/22 Key [id number] Status is pending    [facility] will continue to follow.   Clearance Coots, CPhT Specialty Pharmacy Patient [facility] for Infectious Disease Phone: [phone] Fax:  [phone]

## 2022-08-15 NOTE — Addendum Note (Signed)
Addended by: Jeanine Luz D on: 08/15/2022 02:11 PM   Modules accepted: Orders

## 2022-08-18 ENCOUNTER — Other Ambulatory Visit: Payer: Self-pay

## 2022-08-18 ENCOUNTER — Other Ambulatory Visit (HOSPITAL_COMMUNITY): Payer: Self-pay

## 2022-08-18 ENCOUNTER — Telehealth: Payer: Self-pay

## 2022-08-18 MED ORDER — ENTECAVIR 0.5 MG PO TABS
0.5000 mg | ORAL_TABLET | Freq: Every day | ORAL | 5 refills | Status: DC
  Filled 2022-08-18 (×2): qty 30, 30d supply, fill #0
  Filled 2022-09-09 – 2022-12-18 (×2): qty 30, 30d supply, fill #1

## 2022-08-18 NOTE — Addendum Note (Signed)
Addended by: Jeanine Luz D on: 08/18/2022 03:02 PM   Modules accepted: Orders

## 2022-08-18 NOTE — Telephone Encounter (Signed)
[facility] Patient Advocate Encounter   Was successful in obtaining a Daniel Dunlap copay card for Prezcobix.  This copay card will make the patients copay $0.00.  I have spoken with the patient.    The billing information is as follows and has been shared with [facility].         Clearance Coots, CPhT Specialty Pharmacy Patient [facility] for Infectious Disease Phone: [phone] Fax:  [phone]

## 2022-08-19 ENCOUNTER — Other Ambulatory Visit: Payer: Self-pay

## 2022-08-19 LAB — LIVER FIBROSIS, FIBROTEST-ACTITEST
ALT: 11 U/L (ref 9–46)
Alpha-2-Macroglobulin: 198 mg/dL (ref 106–279)
Apolipoprotein A1: 147 mg/dL (ref 94–176)
Bilirubin: 0.4 mg/dL (ref 0.2–1.2)
Fibrosis Score: 0.09
GGT: 12 U/L (ref 3–95)
Haptoglobin: 200 mg/dL (ref 43–212)
Necroinflammat ACT Score: 0.02
Reference ID: 4881724

## 2022-08-19 LAB — HEPATIC FUNCTION PANEL
AG Ratio: 1.1 (calc) (ref 1.0–2.5)
AST: 20 U/L (ref 10–40)
Albumin: 4.4 g/dL (ref 3.6–5.1)
Alkaline phosphatase (APISO): 69 U/L (ref 36–130)
Bilirubin, Direct: 0.1 mg/dL (ref 0.0–0.2)
Globulin: 4 g/dL (calc) — ABNORMAL HIGH (ref 1.9–3.7)
Total Protein: 8.4 g/dL — ABNORMAL HIGH (ref 6.1–8.1)

## 2022-08-19 LAB — HEPATITIS B DNA, ULTRAQUANTITATIVE, PCR
Hepatitis B DNA: 19 IU/mL — ABNORMAL HIGH
Hepatitis B virus DNA: 1.28 Log IU/mL — ABNORMAL HIGH

## 2022-08-19 LAB — BASIC METABOLIC PANEL
BUN: 17 mg/dL (ref 7–25)
CO2: 28 mmol/L (ref 20–32)
Calcium: 9.2 mg/dL (ref 8.6–10.3)
Chloride: 98 mmol/L (ref 98–110)
Creat: 1.05 mg/dL (ref 0.60–1.29)
Glucose, Bld: 74 mg/dL (ref 65–99)
Potassium: 3.3 mmol/L — ABNORMAL LOW (ref 3.5–5.3)
Sodium: 136 mmol/L (ref 135–146)

## 2022-08-19 LAB — HEPATITIS B SURFACE ANTIBODY,QUALITATIVE: Hep B S Ab: NONREACTIVE

## 2022-08-19 LAB — HEPATITIS B E ANTIGEN: Hep B E Ag: NONREACTIVE

## 2022-08-19 LAB — HEPATITIS B E ANTIBODY: Hep B E Ab: NONREACTIVE

## 2022-08-19 LAB — HIV RNA, RTPCR W/R GT (RTI, PI,INT)
HIV 1 RNA Quant: 362 copies/mL — ABNORMAL HIGH
HIV-1 RNA Quant, Log: 2.56 Log copies/mL — ABNORMAL HIGH

## 2022-08-19 LAB — HEPATITIS B SURFACE ANTIGEN: Hepatitis B Surface Ag: REACTIVE — AB

## 2022-08-28 ENCOUNTER — Other Ambulatory Visit (HOSPITAL_COMMUNITY): Payer: Self-pay

## 2022-09-08 ENCOUNTER — Telehealth: Payer: Self-pay

## 2022-09-08 NOTE — Telephone Encounter (Signed)
Detectable Viral Load Intervention   Most recent VL:  HIV 1 RNA Quant  Date Value Ref Range Status  08/12/2022 362 (H) copies/mL Final  12/25/2021 118 (H) Copies/mL Final  12/20/2021 450 copies/mL Corrected    Comment:    (NOTE) The reportable range for this assay is 20 to 10,000,000 copies HIV-1 RNA/mL.     Current ART regimen: Tivicay and Symtuza  Appointment status: patient has future appointment scheduled  Called patient to discuss medication adherence and possible barriers to care.   Medication last dispensed (per chart review):   Dispensed Days Supply Quantity Provider Pharmacy  Darunavir-Cobicistat-Emtricitabine-Tenofovir Alafenamide [facility]) 800-150-200-10 MG TABS 02/12/2022 30 30 tablet Veryl Speak, FNP [place] [facility]  Darunavir-Cobicistat-Emtricitabine-Tenofovir Alafenamide (SYMTUZA) 800-150-200-10 MG TABS 01/16/2022 30 30 tablet Veryl Speak, FNP [place] [facility]  Darunavir-Cobicistat-Emtricitabine-Tenofovir Alafenamide (SYMTUZA) 800-150-200-10 MG TABS 12/21/2021 30 30 tablet Sheikh, Omair [place], DO [place] [facility]    dolutegravir (TIVICAY) 50 MG tablet 08/19/2022 30 30 tablet Veryl Speak, FNP [place] [facility]  dolutegravir (TIVICAY) 50 MG tablet 02/12/2022 30 30 tablet Veryl Speak, FNP [place] [facility]  dolutegravir (TIVICAY) 50 MG tablet 01/16/2022 30 30 tablet Veryl Speak, FNP [place] [facility]   Medication Adherence   Unable to assess.    Barriers to Care   Unable to assess.   Interventions: Called [facility] to remind him of upcoming appointment, no answer. Left HIPAA compliant voicemail asking him to call back if he needs to reschedule.   Sandie Ano, RN

## 2022-09-09 ENCOUNTER — Other Ambulatory Visit (HOSPITAL_COMMUNITY): Payer: Self-pay

## 2022-09-10 ENCOUNTER — Other Ambulatory Visit (HOSPITAL_COMMUNITY): Payer: Self-pay

## 2022-09-11 ENCOUNTER — Other Ambulatory Visit (HOSPITAL_COMMUNITY): Payer: Self-pay

## 2022-09-11 ENCOUNTER — Ambulatory Visit: Payer: Self-pay | Admitting: Family

## 2022-09-12 ENCOUNTER — Other Ambulatory Visit (HOSPITAL_COMMUNITY): Payer: Self-pay

## 2022-09-24 ENCOUNTER — Telehealth: Payer: Self-pay

## 2022-09-24 NOTE — Telephone Encounter (Signed)
Called patient to reschedule his missed appointment, no answer. Left HIPAA compliant voicemail requesting callback.   Sandie Ano, RN

## 2022-10-07 ENCOUNTER — Other Ambulatory Visit (HOSPITAL_COMMUNITY): Payer: Self-pay

## 2022-10-08 ENCOUNTER — Other Ambulatory Visit (HOSPITAL_COMMUNITY): Payer: Self-pay

## 2022-10-31 ENCOUNTER — Other Ambulatory Visit (HOSPITAL_COMMUNITY): Payer: Self-pay

## 2022-11-20 ENCOUNTER — Other Ambulatory Visit (HOSPITAL_COMMUNITY): Payer: Self-pay

## 2022-12-04 ENCOUNTER — Telehealth: Payer: Self-pay

## 2022-12-04 ENCOUNTER — Other Ambulatory Visit (HOSPITAL_COMMUNITY): Payer: Self-pay

## 2022-12-04 NOTE — Telephone Encounter (Signed)
[facility] Patient Advocate Encounter   Received notification from [employer] that prior authorization for Prezcobix is required.   PA submitted on 12/04/22 Key [id number] Status is pending    [facility] will continue to follow.   Clearance Coots, CPhT Specialty Pharmacy Patient [facility] for [facility]ious Disease Phone: [phone] Fax:  [phone]

## 2022-12-04 NOTE — Telephone Encounter (Signed)
[facility] Patient Advocate Encounter  Prior Authorization for Prezcobix has been approved.    PA# [id number] Effective dates: 12/04/22 through 12/04/23  Patients co-pay is $225.00.   [facility] will continue to follow.  Clearance Coots, CPhT Specialty Pharmacy Patient [facility] for [facility]ious Disease Phone: [phone] Fax:  [phone]

## 2022-12-18 ENCOUNTER — Other Ambulatory Visit: Payer: Self-pay

## 2022-12-18 ENCOUNTER — Other Ambulatory Visit (HOSPITAL_COMMUNITY): Payer: Self-pay

## 2023-01-02 ENCOUNTER — Other Ambulatory Visit (HOSPITAL_COMMUNITY): Payer: Self-pay

## 2023-01-02 ENCOUNTER — Other Ambulatory Visit: Payer: Self-pay

## 2023-01-05 ENCOUNTER — Other Ambulatory Visit (HOSPITAL_COMMUNITY): Payer: Self-pay

## 2023-01-14 ENCOUNTER — Other Ambulatory Visit: Payer: Self-pay

## 2023-01-15 ENCOUNTER — Other Ambulatory Visit (HOSPITAL_COMMUNITY): Payer: Self-pay

## 2023-01-19 ENCOUNTER — Other Ambulatory Visit: Payer: Self-pay | Admitting: Pharmacist

## 2023-01-19 ENCOUNTER — Other Ambulatory Visit (HOSPITAL_COMMUNITY): Payer: Self-pay

## 2023-02-16 ENCOUNTER — Other Ambulatory Visit: Payer: Self-pay

## 2023-02-16 DIAGNOSIS — B181 Chronic viral hepatitis B without delta-agent: Secondary | ICD-10-CM

## 2023-02-16 DIAGNOSIS — Z79899 Other long term (current) drug therapy: Secondary | ICD-10-CM

## 2023-02-16 DIAGNOSIS — B20 Human immunodeficiency virus [HIV] disease: Secondary | ICD-10-CM

## 2023-02-16 DIAGNOSIS — Z113 Encounter for screening for infections with a predominantly sexual mode of transmission: Secondary | ICD-10-CM

## 2023-02-16 NOTE — Addendum Note (Signed)
Addended by: Linna Hoff D on: 02/16/2023 02:34 PM   Modules accepted: Orders

## 2023-02-17 ENCOUNTER — Other Ambulatory Visit (HOSPITAL_COMMUNITY): Payer: Self-pay

## 2023-02-17 ENCOUNTER — Other Ambulatory Visit: Payer: Self-pay

## 2023-02-24 ENCOUNTER — Ambulatory Visit: Payer: Self-pay | Admitting: Family

## 2023-03-06 ENCOUNTER — Telehealth: Payer: Self-pay

## 2023-03-06 NOTE — Telephone Encounter (Signed)
Patient called, reports he has had testicular pain and swelling for the last two days. Recommended evaluation in ED to rule out testicular torsion or other complication since clinic has no available appointments today. Patient verbalized understanding and has no further questions.    Sandie Ano, RN

## 2023-03-07 ENCOUNTER — Encounter (HOSPITAL_COMMUNITY): Payer: Self-pay | Admitting: Emergency Medicine

## 2023-03-07 ENCOUNTER — Other Ambulatory Visit: Payer: Self-pay

## 2023-03-07 ENCOUNTER — Emergency Department (HOSPITAL_COMMUNITY): Payer: BC Managed Care – PPO

## 2023-03-07 ENCOUNTER — Emergency Department (HOSPITAL_COMMUNITY)
Admission: EM | Admit: 2023-03-07 | Discharge: 2023-03-07 | Disposition: A | Discharge status: Home / Self Care | Payer: BC Managed Care – PPO | Attending: Emergency Medicine | Admitting: Emergency Medicine

## 2023-03-07 DIAGNOSIS — N453 Epididymo-orchitis: Secondary | ICD-10-CM

## 2023-03-07 DIAGNOSIS — D72829 Elevated white blood cell count, unspecified: Secondary | ICD-10-CM | POA: Diagnosis not present

## 2023-03-07 DIAGNOSIS — E871 Hypo-osmolality and hyponatremia: Secondary | ICD-10-CM | POA: Diagnosis not present

## 2023-03-07 DIAGNOSIS — N50819 Testicular pain, unspecified: Secondary | ICD-10-CM | POA: Diagnosis not present

## 2023-03-07 DIAGNOSIS — Z21 Asymptomatic human immunodeficiency virus [HIV] infection status: Secondary | ICD-10-CM | POA: Insufficient documentation

## 2023-03-07 DIAGNOSIS — E876 Hypokalemia: Secondary | ICD-10-CM | POA: Diagnosis not present

## 2023-03-07 DIAGNOSIS — N5089 Other specified disorders of the male genital organs: Secondary | ICD-10-CM | POA: Diagnosis not present

## 2023-03-07 DIAGNOSIS — N433 Hydrocele, unspecified: Secondary | ICD-10-CM | POA: Diagnosis not present

## 2023-03-07 LAB — CBC WITH DIFFERENTIAL/PLATELET
Abs Immature Granulocytes: 0.16 10*3/uL — ABNORMAL HIGH (ref 0.00–0.07)
Basophils Absolute: 0 10*3/uL (ref 0.0–0.1)
Basophils Relative: 0 %
Eosinophils Absolute: 0.1 10*3/uL (ref 0.0–0.5)
Eosinophils Relative: 0 %
HCT: 37.8 % — ABNORMAL LOW (ref 39.0–52.0)
Hemoglobin: 12.8 g/dL — ABNORMAL LOW (ref 13.0–17.0)
Immature Granulocytes: 1 %
Lymphocytes Relative: 11 %
Lymphs Abs: 1.9 10*3/uL (ref 0.7–4.0)
MCH: 28.2 pg (ref 26.0–34.0)
MCHC: 33.9 g/dL (ref 30.0–36.0)
MCV: 83.3 fL (ref 80.0–100.0)
Monocytes Absolute: 0.9 10*3/uL (ref 0.1–1.0)
Monocytes Relative: 5 %
Neutro Abs: 14.5 10*3/uL — ABNORMAL HIGH (ref 1.7–7.7)
Neutrophils Relative %: 83 %
Platelets: 287 10*3/uL (ref 150–400)
RBC: 4.54 MIL/uL (ref 4.22–5.81)
RDW: 13.1 % (ref 11.5–15.5)
WBC: 17.6 10*3/uL — ABNORMAL HIGH (ref 4.0–10.5)
nRBC: 0 % (ref 0.0–0.2)

## 2023-03-07 LAB — URINALYSIS, ROUTINE W REFLEX MICROSCOPIC
Bacteria, UA: NONE SEEN
Bilirubin Urine: NEGATIVE
Glucose, UA: NEGATIVE mg/dL
Ketones, ur: NEGATIVE mg/dL
Leukocytes,Ua: NEGATIVE
Nitrite: NEGATIVE
Protein, ur: 100 mg/dL — AB
Specific Gravity, Urine: 1.027 (ref 1.005–1.030)
pH: 5 (ref 5.0–8.0)

## 2023-03-07 LAB — COMPREHENSIVE METABOLIC PANEL
ALT: 18 U/L (ref 0–44)
AST: 25 U/L (ref 15–41)
Albumin: 3.4 g/dL — ABNORMAL LOW (ref 3.5–5.0)
Alkaline Phosphatase: 61 U/L (ref 38–126)
Anion gap: 9 (ref 5–15)
BUN: 16 mg/dL (ref 6–20)
CO2: 26 mmol/L (ref 22–32)
Calcium: 8.4 mg/dL — ABNORMAL LOW (ref 8.9–10.3)
Chloride: 91 mmol/L — ABNORMAL LOW (ref 98–111)
Creatinine, Ser: 0.95 mg/dL (ref 0.61–1.24)
GFR, Estimated: >60 mL/min (ref >60)
Glucose, Bld: 108 mg/dL — ABNORMAL HIGH (ref 70–99)
Potassium: 2.8 mmol/L — ABNORMAL LOW (ref 3.5–5.1)
Sodium: 126 mmol/L — ABNORMAL LOW (ref 135–145)
Total Bilirubin: 0.9 mg/dL (ref <1.2)
Total Protein: 9.5 g/dL — ABNORMAL HIGH (ref 6.5–8.1)

## 2023-03-07 LAB — MAGNESIUM: Magnesium: 2.3 mg/dL (ref 1.7–2.4)

## 2023-03-07 MED ORDER — IBUPROFEN 600 MG PO TABS: 600.0000 mg | ORAL_TABLET | Freq: Four times a day (QID) | ORAL | 0 refills | Status: DC | PRN

## 2023-03-07 MED ORDER — POTASSIUM CHLORIDE CRYS ER 20 MEQ PO TBCR
40.0000 meq | EXTENDED_RELEASE_TABLET | Freq: Once | ORAL | Status: AC
  Administered 2023-03-07: 40 meq via ORAL
  Filled 2023-03-07: qty 2

## 2023-03-07 MED ORDER — SODIUM CHLORIDE 0.9 % IV SOLN
1.0000 g | Freq: Once | INTRAVENOUS | Status: AC
  Administered 2023-03-07: 1 g via INTRAVENOUS
  Filled 2023-03-07: qty 10

## 2023-03-07 MED ORDER — LEVOFLOXACIN 500 MG PO TABS: 500.0000 mg | ORAL_TABLET | Freq: Every day | ORAL | 0 refills | Status: DC

## 2023-03-07 MED ORDER — POTASSIUM CHLORIDE CRYS ER 20 MEQ PO TBCR: 20.0000 meq | EXTENDED_RELEASE_TABLET | Freq: Every day | ORAL | 0 refills | Status: DC

## 2023-03-07 MED ORDER — SODIUM CHLORIDE 0.9 % IV BOLUS
1000.0000 mL | Freq: Once | INTRAVENOUS | Status: AC
Start: 2023-03-07 — End: 2023-03-07
  Administered 2023-03-07: 1000 mL via INTRAVENOUS

## 2023-03-07 MED ORDER — KETOROLAC TROMETHAMINE 30 MG/ML IJ SOLN
30.0000 mg | Freq: Once | INTRAMUSCULAR | Status: AC
  Administered 2023-03-07: 30 mg via INTRAVENOUS
  Filled 2023-03-07: qty 1

## 2023-03-07 MED ORDER — POTASSIUM CHLORIDE 10 MEQ/100ML IV SOLN
10.0000 meq | Freq: Once | INTRAVENOUS | Status: AC
  Administered 2023-03-07: 10 meq via INTRAVENOUS
  Filled 2023-03-07: qty 100

## 2023-03-07 MED ORDER — HYDROCODONE-ACETAMINOPHEN 5-325 MG PO TABS: 1.0000 | ORAL_TABLET | ORAL | 0 refills | Status: DC | PRN

## 2023-03-07 NOTE — ED Triage Notes (Signed)
Patient arrives ambulatory by POV c/o left sided testicle swelling and pain x 2 days. Denies any urinary symptoms or discharge. Pain worse when movement and standing.

## 2023-03-07 NOTE — ED Provider Triage Note (Signed)
Emergency Medicine Provider Triage Evaluation Note  Daniel Dunlap , a 42 y.o. male  was evaluated in triage.  Pt complains of testicular pain x 2 days.  Review of Systems  Positive: As above  Negative: As above  Physical Exam  BP 126/85 (BP Location: Left Arm)   Pulse 94   Temp 99.1 F (37.3 C) (Oral)   Resp 18   Ht 5\' 9"  (1.753 m)   Wt 98.9 kg   SpO2 99%   BMI 32.19 kg/m  Gen:   Awake, no distress   Resp:  Normal effort  MSK:   Moves extremities without difficulty  Other:    Medical Decision Making  Medically screening exam initiated at 3:10 PM.  Appropriate orders placed.  Daniel Dunlap was informed that the remainder of the evaluation will be completed by another provider, this initial triage assessment does not replace that evaluation, and the importance of remaining in the ED until their evaluation is complete.     Marita [place], PA-C 03/07/23 1511

## 2023-03-07 NOTE — ED Notes (Signed)
Patient in room for [place].

## 2023-03-07 NOTE — ED Provider Notes (Signed)
[place] EMERGENCY DEPARTMENT AT [facility] Provider Note   CSN: [id number] Arrival date & time: 03/07/23  1450     History  Chief Complaint  Patient presents with   Groin Swelling    Daniel Dunlap is a 42 y.o. male.  Pt is a 42 yo male with pmhx significant for HIV, hx syphilis and hep b.  Pt said he noticed some redness and swelling to the left testicle for 2 days.  It is worse today.  The pain is worse with sitting.  Pt has not had fevers.  No urinary complaints.         Home Medications Prior to Admission medications   Medication Sig Start Date End Date Taking? Authorizing Provider  HYDROcodone-acetaminophen (NORCO/VICODIN) 5-325 MG tablet Take 1 tablet by mouth every 4 (four) hours as needed. 03/07/23  Yes Jacalyn Lefevre, MD  ibuprofen (ADVIL) 600 MG tablet Take 1 tablet (600 mg total) by mouth every 6 (six) hours as needed. 03/07/23  Yes Jacalyn Lefevre, MD  levofloxacin (LEVAQUIN) 500 MG tablet Take 1 tablet (500 mg total) by mouth daily. 03/07/23  Yes Jacalyn Lefevre, MD  potassium chloride SA (KLOR-CON M) 20 MEQ tablet Take 1 tablet (20 mEq total) by mouth daily. 03/07/23  Yes Jacalyn Lefevre, MD  darunavir-cobicistat (PREZCOBIX) 800-150 MG tablet Take 1 tablet by mouth daily with breakfast. Swallow whole. Do NOT crush, break or chew tablets. Take with food. 08/15/22   Veryl Speak, FNP  dolutegravir (TIVICAY) 50 MG tablet Take 1 tablet (50 mg total) by mouth daily. 08/12/22   Veryl Speak, FNP  entecavir (BARACLUDE) 0.5 MG tablet Take 1 tablet (0.5 mg total) by mouth daily. 08/18/22   Veryl Speak, FNP  levocetirizine (XYZAL) 5 MG tablet Take 1 tablet (5 mg total) by mouth every evening. 08/12/22   Veryl Speak, FNP  ondansetron (ZOFRAN) 4 MG tablet Take 1 tablet (4 mg total) by mouth every 6 (six) hours as needed for nausea. Patient not taking: Reported on 08/12/2022 12/22/21   Marguerita Merles Latif, DO  senna-docusate (SENOKOT-S) 8.6-50 MG  tablet Take 1 tablet by mouth at bedtime as needed for mild constipation. Patient not taking: Reported on 12/25/2021 12/22/21   Marguerita Merles Latif, DO  sulfamethoxazole-trimethoprim (BACTRIM DS) 800-160 MG tablet Take 1 tablet by mouth daily. 08/15/22   Veryl Speak, FNP      Allergies    Patient has no known allergies.    Review of Systems   Review of Systems  Genitourinary:  Positive for scrotal swelling and testicular pain.  All other systems reviewed and are negative.   Physical Exam Updated Vital Signs BP 137/88 (BP Location: Left Arm)   Pulse 88   Temp 98.4 F (36.9 C) (Oral)   Resp 18   Ht 5\' 9"  (1.753 m)   Wt 98.9 kg   SpO2 100%   BMI 32.19 kg/m  Physical Exam Vitals and nursing note reviewed. Exam conducted with a chaperone present.  Constitutional:      Appearance: Normal appearance.  HENT:     Head: Normocephalic and atraumatic.     Right Ear: External ear normal.     Left Ear: External ear normal.     Nose: Nose normal.     Mouth/Throat:     Mouth: Mucous membranes are moist.     Pharynx: Oropharynx is clear.  Eyes:     Extraocular Movements: Extraocular movements intact.     Conjunctiva/sclera: Conjunctivae normal.  Pupils: Pupils are equal, round, and reactive to light.  Cardiovascular:     Rate and Rhythm: Normal rate and regular rhythm.     Pulses: Normal pulses.     Heart sounds: Normal heart sounds.  Pulmonary:     Effort: Pulmonary effort is normal.     Breath sounds: Normal breath sounds.  Abdominal:     General: Abdomen is flat. Bowel sounds are normal.     Palpations: Abdomen is soft.  Genitourinary:    Penis: Circumcised.      Testes:        Left: Tenderness and swelling present.     Epididymis:     Left: Inflamed. Tenderness present.  Musculoskeletal:        General: Normal range of motion.     Cervical back: Normal range of motion and neck supple.  Skin:    General: Skin is warm.     Capillary Refill: Capillary refill takes  less than 2 seconds.  Neurological:     General: No focal deficit present.     Mental Status: He is alert and oriented to person, place, and time.  Psychiatric:        Mood and Affect: Mood normal.        Behavior: Behavior normal.     ED Results / Procedures / Treatments   Labs (all labs ordered are listed, but only abnormal results are displayed) Labs Reviewed  URINALYSIS, ROUTINE W REFLEX MICROSCOPIC - Abnormal; Notable for the following components:      Result Value   Color, Urine AMBER (*)    APPearance HAZY (*)    Hgb urine dipstick SMALL (*)    Protein, ur 100 (*)    All other components within normal limits  CBC WITH DIFFERENTIAL/PLATELET - Abnormal; Notable for the following components:   WBC 17.6 (*)    Hemoglobin 12.8 (*)    HCT 37.8 (*)    Neutro Abs 14.5 (*)    Abs Immature Granulocytes 0.16 (*)    All other components within normal limits  COMPREHENSIVE METABOLIC PANEL - Abnormal; Notable for the following components:   Sodium 126 (*)    Potassium 2.8 (*)    Chloride 91 (*)    Glucose, Bld 108 (*)    Calcium 8.4 (*)    Total Protein 9.5 (*)    Albumin 3.4 (*)    All other components within normal limits  MAGNESIUM  GC/CHLAMYDIA PROBE AMP ([place]) NOT AT [facility]    EKG None  Radiology US SCROTUM W/DOPPLER  Result Date: 03/07/2023 CLINICAL DATA:  Testicular pain for 2 days. EXAM: SCROTAL ULTRASOUND DOPPLER ULTRASOUND OF THE TESTICLES TECHNIQUE: Complete ultrasound examination of the testicles, epididymis, and other scrotal structures was performed. Color and spectral Doppler ultrasound were also utilized to evaluate blood flow to the testicles. COMPARISON:  CT abdomen and pelvis dated 12/16/2021. FINDINGS: Right testicle Measurements: 4.9 x 2.8 x 3.5 cm. No mass or microlithiasis visualized. Left testicle Measurements: 5.4 x 3.8 x 4.2 cm. The left testicle is mildly hyperemic. No findings to suggest abscess. No mass or microlithiasis visualized. Right  epididymis:  Normal in size and appearance. Left epididymis: The left epididymis is mildly hyperemic and enlarged. Hydrocele:  There is a small left hydrocele. Varicocele:  None visualized. Pulsed Doppler interrogation of both testes demonstrates normal low resistance arterial and venous waveforms bilaterally. Other: No evidence of inguinal hernia or inguinal adenopathy on either side. IMPRESSION: Findings likely reflecting left epididymoorchitis. Electronically  Signed   By: Romona Curls M.D.   On: 03/07/2023 17:50    Procedures Procedures    Medications Ordered in ED Medications  cefTRIAXone (ROCEPHIN) 1 g in sodium chloride 0.9 % 100 mL IVPB (1 g Intravenous New Bag/Given 03/07/23 2144)  potassium chloride 10 mEq in 100 mL IVPB (10 mEq Intravenous New Bag/Given 03/07/23 2143)  ketorolac (TORADOL) 30 MG/ML injection 30 mg (30 mg Intravenous Given 03/07/23 2137)  potassium chloride SA (KLOR-CON M) CR tablet 40 mEq (40 mEq Oral Given 03/07/23 2137)  sodium chloride 0.9 % bolus 1,000 mL (1,000 mLs Intravenous New Bag/Given 03/07/23 2141)    ED Course/ Medical Decision Making/ A&P                                 Medical Decision Making Amount and/or Complexity of Data Reviewed Labs: ordered.  Risk Prescription drug management.   This patient presents to the ED for concern of left testicle pain, this involves an extensive number of treatment options, and is a complaint that carries with it a high risk of complications and morbidity.  The differential diagnosis includes torsion, orchitis, epididymitis, std   Co morbidities that complicate the patient evaluation  HIV, hx syphilis and hep b   Additional history obtained:  Additional history obtained from epic chart review  Lab Tests:  I Ordered, and personally interpreted labs.  The pertinent results include:  cbc with wbc elevated at 17.6, cmp with na low at 126 (nl in the past), k low at 2.8; ua+ hgb and pro; mg nl at 2.3   Imaging  Studies ordered:  I ordered imaging studies including [place]  I independently visualized and interpreted imaging which showed Findings likely reflecting left epididymoorchitis.  I agree with the radiologist interpretation   Cardiac Monitoring:  The patient was maintained on a cardiac monitor.  I personally viewed and interpreted the cardiac monitored which showed an underlying rhythm of: nsr   Medicines ordered and prescription drug management:  I ordered medication including rocephin/toradol  for sx  Reevaluation of the patient after these medicines showed that the patient improved I have reviewed the patients home medicines and have made adjustments as needed   Test Considered:  [place]   Critical Interventions:  Abx/pain control   Problem List / ED Course:  Left epididymoorchitis:  pt treated with rocephin in ED.  He will be d/c with levaquin.  Testing for chlamydia and gonorrhea sent.  F/u with urology Hypokalemia:  IV kcl given Hyponatremia:  IV NS given    Reevaluation:  After the interventions noted above, I reevaluated the patient and found that they have :improved   Social Determinants of Health:  Lives at home   Dispostion:  After consideration of the diagnostic results and the patients response to treatment, I feel that the patent would benefit from discharge with outpatient f/u.          Final Clinical Impression(s) / ED Diagnoses Final diagnoses:  Orchitis and epididymitis  Hypokalemia  Hyponatremia    Rx / DC Orders ED Discharge Orders          Ordered    levofloxacin (LEVAQUIN) 500 MG tablet  Daily        03/07/23 2109    ibuprofen (ADVIL) 600 MG tablet  Every 6 hours PRN        03/07/23 2109    HYDROcodone-acetaminophen (NORCO/VICODIN) 5-325 MG tablet  Every 4 hours PRN        03/07/23 2109    potassium chloride SA (KLOR-CON M) 20 MEQ tablet  Daily        03/07/23 2110              Jacalyn Lefevre, MD 03/07/23 2208

## 2023-03-09 LAB — GC/CHLAMYDIA PROBE AMP (~~LOC~~) NOT AT ARMC
Chlamydia: NEGATIVE
Comment: NEGATIVE
Comment: NORMAL
Neisseria Gonorrhea: NEGATIVE

## 2023-03-09 NOTE — Telephone Encounter (Signed)
Patient called back requesting to speak to a nurse. Patient went to ED on 11/9 for testicular swelling. Patient stated that he was told that it wasn't an ER issue. Patient wanted to know if we could give him an antibiotic injection because he doesn't like taking a bunch of medications. Informed patient I would have to ask his provider. STI results are still in process. Patient stated that the testicle swelling and pain isn't as bad and he is at work today. Advised patient to take the medications that the ED prescribed for now and he could call us when STI results are in if abnormal. Patient then stated that our office doesn't ever call him and that he always has to call. Informed patient that since the ED provider ordered tests we wouldn't know to call him but I would send a message to his provider to make him aware. If he doesn't want to wait for STI results he could make an appointment with another provider sometime this week. For now patient agreeable to take antibiotic and pain medications given by ED, will  also wait for STI results to come in and/or [place],NP responds with any other recommendations.    Myanna Ziesmer Lesli Albee, CMA

## 2023-03-11 NOTE — Telephone Encounter (Signed)
Patient aware and still having "a little" pain in testicles. Patient agreed to make an appt.  Manaia Samad Lesli Albee, CMA

## 2023-03-11 NOTE — Telephone Encounter (Signed)
No available appointments until 2 weeks out. Advised front desk staff if we have any cancellations to call patient for an appointment. Patient in the meantime can continue takinng medications prescribed by ED and go to UC if pain worsens.    Daniel Dunlap, CMA

## 2023-03-23 ENCOUNTER — Other Ambulatory Visit: Payer: Self-pay | Admitting: Pharmacist

## 2023-03-23 ENCOUNTER — Ambulatory Visit (INDEPENDENT_AMBULATORY_CARE_PROVIDER_SITE_OTHER): Payer: Self-pay | Admitting: Family

## 2023-03-23 ENCOUNTER — Other Ambulatory Visit: Payer: Self-pay

## 2023-03-23 ENCOUNTER — Telehealth: Payer: Self-pay

## 2023-03-23 ENCOUNTER — Other Ambulatory Visit (HOSPITAL_COMMUNITY)
Admission: RE | Admit: 2023-03-23 | Discharge: 2023-03-23 | Disposition: A | Discharge status: Home / Self Care | Payer: BC Managed Care – PPO | Source: Ambulatory Visit | Attending: Family | Admitting: Family

## 2023-03-23 ENCOUNTER — Encounter: Payer: Self-pay | Admitting: Family

## 2023-03-23 ENCOUNTER — Other Ambulatory Visit (HOSPITAL_COMMUNITY): Payer: Self-pay

## 2023-03-23 VITALS — BP 119/80 | HR 65 | Temp 97.3°F | Ht 69.0 in | Wt 214.0 lb

## 2023-03-23 DIAGNOSIS — Z113 Encounter for screening for infections with a predominantly sexual mode of transmission: Secondary | ICD-10-CM | POA: Insufficient documentation

## 2023-03-23 DIAGNOSIS — Z1212 Encounter for screening for malignant neoplasm of rectum: Secondary | ICD-10-CM | POA: Diagnosis not present

## 2023-03-23 DIAGNOSIS — B20 Human immunodeficiency virus [HIV] disease: Secondary | ICD-10-CM

## 2023-03-23 DIAGNOSIS — Z23 Encounter for immunization: Secondary | ICD-10-CM | POA: Diagnosis not present

## 2023-03-23 DIAGNOSIS — B181 Chronic viral hepatitis B without delta-agent: Secondary | ICD-10-CM | POA: Diagnosis not present

## 2023-03-23 DIAGNOSIS — Z1151 Encounter for screening for human papillomavirus (HPV): Secondary | ICD-10-CM | POA: Diagnosis not present

## 2023-03-23 DIAGNOSIS — Z0000 Encounter for general adult medical examination without abnormal findings: Secondary | ICD-10-CM

## 2023-03-23 MED ORDER — BIKTARVY 50-200-25 MG PO TABS
1.0000 | ORAL_TABLET | Freq: Every day | ORAL | 5 refills | Status: DC
  Filled 2023-03-23 (×2): qty 30, 30d supply, fill #0
  Filled 2023-05-21 (×2): qty 30, 30d supply, fill #1

## 2023-03-23 NOTE — Progress Notes (Signed)
Specialty Pharmacy Initial Fill Coordination Note  Daniel Dunlap is a 43 y.o. male contacted today regarding initial fill of specialty medication(s) Bictegravir-Emtricitab-Tenofov   Patient requested Delivery   Delivery date: 03/24/23   Verified address: [address]  [place] [address]   Medication will be filled on 03/23/23.   Patient is aware of 0.00 copayment.

## 2023-03-23 NOTE — Telephone Encounter (Signed)
[facility] Patient Advocate Encounter   Was successful in obtaining a [place] copay card for [employer].  This copay card will make the patients copay $0.00.  I have spoken with the patient.    The billing information is as follows and has been shared with [facility].         Clearance Coots, CPhT Specialty Pharmacy Patient [facility] for Infectious Disease Phone: [phone] Fax:  [phone]

## 2023-03-23 NOTE — Assessment & Plan Note (Signed)
Discussed importance of safe sexual practice and condom use. Condoms and STD testing offered.  Prevnar 20 updated.  Anal pap collected.  Due for routine dental care and he will schedule independently.

## 2023-03-23 NOTE — Assessment & Plan Note (Signed)
Mr. Khanna has been off medication for at least 2 months and has poorly controlled virus.  Discussed importance of taking medication on a daily basis to reduce risk of further development of resistance given his already resistant profile.  Will change to Entecavir/Prezcobix to [employer].  May need to add a supplemental agent given K70R and has significant NNRTI and NRTI resistance. Not a candidate for Cabevnua but may consider lenacaprivir.Check lab work. Will start Bactrim for OI prophylaxis pending lab work results. Plan for follow up in 1 month or sooner if needed.

## 2023-03-23 NOTE — Patient Instructions (Addendum)
Nice to see you.  We will check your lab work today.  Continue to take your medication daily as prescribed.  Refills have been sent to the pharmacy.  Plan for follow up in 1 months or sooner if needed with lab work on the same day.  Have a great day and stay safe!

## 2023-03-23 NOTE — Assessment & Plan Note (Signed)
Previously on Entecavir and has not taken recently. Discussed importance of staying on medication and stopping/starting can increase risk of Hepatitis B flare. Check lab work. Will be covered with Biktarvy for now. No evidence of fibrosis on previous Fibrosure.

## 2023-03-23 NOTE — Progress Notes (Signed)
Specialty Pharmacy Initiation Note   Daniel Dunlap is a 42 y.o. male who will be followed by the specialty pharmacy service for RxSp HIV    Review of administration, indication, effectiveness, safety, potential side effects, storage/disposable, and missed dose instructions occurred today for patient's specialty medication(s) Bictegravir-Emtricitab-Tenofov     Patient/Caregiver did not have any additional questions or concerns.   Patient's therapy is appropriate to: Initiate    Goals Addressed             This Visit's Progress    Achieve Undetectable HIV Viral Load < 20       Patient is not on track and no change. Patient will work on increased adherence      Comply with lab assessments       Patient is not on track and improving. Patient will adhere to provider and/or lab appointments      Increase CD4 count until steady state       Patient is not on track and improving. Patient will work on increased adherence      Maintain optimal adherence to therapy       Patient is not on track and no change. Patient will work on increased adherence         Jennette Kettle Specialty Pharmacist

## 2023-03-23 NOTE — Progress Notes (Signed)
Brief Narrative   Patient ID: Daniel Dunlap, male    DOB: 20-Mar-1981, 42 y.o.   MRN: [id number]  Mr. Redler is a 42 y/o AA male diagnosed with AIDS/HIV in January 2009 with risk factor of MSM. Initial viral load was >100,000 and CD4 count 17. Cumulative Genosure with M184V (lamivudine and emtricitabine) and E138A (prob. Rilpivirine). History of PJP at diagnosis. Coinfected with Hepatitis B. [id number] negative. Entered care at [facility] Stage 3. ART experience with Atripla, Darunavir/ritonavir, Tenofovir, Abacavir, and now Symtuza/Tivicay.   Subjective:    Chief Complaint  Patient presents with   HIV Positive/AIDS   Hepatitis B    HPI:  Daniel Dunlap is a 42 y.o. male with HIV disease and chronic hepatitis B last seen on 08/12/2022 with less than optimally controlled virus.  Viral load was 362 with CD4 count of 90 placing him at risk for opportunistic infections.  Recently in the emergency department for testicular pain and concern for possible infection which was treated with 1 dose of ceftriaxone and levofloxacin.  Here today for follow-up office visit.  Mr. Wills has been off medication for at least 2 months with less than optimal adherence prior to that. Tivicay is without side effects when taking however Prezcobix makes him feel different. Feeling well with resolution of his testicular symptoms.  Wanting to get back onto medications.  Covered by [facility].  Condoms and STD testing offered.  Healthcare maintenance reviewed. Working full time.   Denies fevers, chills, night sweats, headaches, changes in vision, neck pain/stiffness, nausea, diarrhea, vomiting, lesions or rashes.  Lab Results  Component Value Date   CD4TCELL 10 (L) 08/12/2022   CD4TABS 90 (L) 08/12/2022   Lab Results  Component Value Date   HIV1RNAQUANT 362 (H) 08/12/2022     No Known Allergies    Outpatient Medications Prior to Visit  Medication Sig Dispense Refill   darunavir-cobicistat (PREZCOBIX)  800-150 MG tablet Take 1 tablet by mouth daily with breakfast. Swallow whole. Do NOT crush, break or chew tablets. Take with food. 30 tablet 5   dolutegravir (TIVICAY) 50 MG tablet Take 1 tablet (50 mg total) by mouth daily. 30 tablet 5   entecavir (BARACLUDE) 0.5 MG tablet Take 1 tablet (0.5 mg total) by mouth daily. 30 tablet 5   levofloxacin (LEVAQUIN) 500 MG tablet Take 1 tablet (500 mg total) by mouth daily. 10 tablet 0   HYDROcodone-acetaminophen (NORCO/VICODIN) 5-325 MG tablet Take 1 tablet by mouth every 4 (four) hours as needed. (Patient not taking: Reported on 03/23/2023) 10 tablet 0   ibuprofen (ADVIL) 600 MG tablet Take 1 tablet (600 mg total) by mouth every 6 (six) hours as needed. (Patient not taking: Reported on 03/23/2023) 30 tablet 0   levocetirizine (XYZAL) 5 MG tablet Take 1 tablet (5 mg total) by mouth every evening. (Patient not taking: Reported on 03/23/2023) 30 tablet 1   ondansetron (ZOFRAN) 4 MG tablet Take 1 tablet (4 mg total) by mouth every 6 (six) hours as needed for nausea. (Patient not taking: Reported on 03/23/2023) 20 tablet 0   potassium chloride SA (KLOR-CON M) 20 MEQ tablet Take 1 tablet (20 mEq total) by mouth daily. (Patient not taking: Reported on 03/23/2023) 14 tablet 0   senna-docusate (SENOKOT-S) 8.6-50 MG tablet Take 1 tablet by mouth at bedtime as needed for mild constipation. (Patient not taking: Reported on 03/23/2023) 30 tablet 0   sulfamethoxazole-trimethoprim (BACTRIM DS) 800-160 MG tablet Take 1 tablet by mouth daily. (Patient not  taking: Reported on 03/23/2023) 30 tablet 3   No facility-administered medications prior to visit.     Past Medical History:  Diagnosis Date   Genital warts    HIV infection (HCC)    HPV (human papilloma virus) infection    Thyroid disease      History reviewed. No pertinent surgical history.    Review of Systems  Constitutional:  Negative for appetite change, chills, fatigue, fever and unexpected weight  change.  Eyes:  Negative for visual disturbance.  Respiratory:  Negative for cough, chest tightness, shortness of breath and wheezing.   Cardiovascular:  Negative for chest pain and leg swelling.  Gastrointestinal:  Negative for abdominal pain, constipation, diarrhea, nausea and vomiting.  Genitourinary:  Negative for dysuria, flank pain, frequency, genital sores, hematuria and urgency.  Skin:  Negative for rash.  Allergic/Immunologic: Negative for immunocompromised state.  Neurological:  Negative for dizziness and headaches.      Objective:    BP 119/80   Pulse 65   Temp (!) 97.3 F (36.3 C) (Temporal)   Ht 5\' 9"  (1.753 m)   Wt 214 lb (97.1 kg)   BMI 31.60 kg/m  Nursing note and vital signs reviewed.  Physical Exam Constitutional:      General: He is not in acute distress.    Appearance: He is well-developed.  Eyes:     Conjunctiva/sclera: Conjunctivae normal.  Cardiovascular:     Rate and Rhythm: Normal rate and regular rhythm.     Heart sounds: Normal heart sounds. No murmur heard.    No friction rub. No gallop.  Pulmonary:     Effort: Pulmonary effort is normal. No respiratory distress.     Breath sounds: Normal breath sounds. No wheezing or rales.  Chest:     Chest wall: No tenderness.  Abdominal:     General: Bowel sounds are normal.     Palpations: Abdomen is soft.     Tenderness: There is no abdominal tenderness.  Musculoskeletal:     Cervical back: Neck supple.  Lymphadenopathy:     Cervical: No cervical adenopathy.  Skin:    General: Skin is warm and dry.     Findings: No rash.  Neurological:     Mental Status: He is alert and oriented to person, place, and time.  Psychiatric:        Behavior: Behavior normal.        Thought Content: Thought content normal.        Judgment: Judgment normal.         03/23/2023   11:06 AM 08/12/2022    3:13 PM 12/13/2020   11:13 AM 09/11/2020    9:16 AM 08/14/2020    9:34 AM  Depression screen PHQ 2/9  Decreased  Interest 0 0 0 0 0  Down, Depressed, Hopeless 0 0 0 0 0  PHQ - 2 Score 0 0 0 0 0       Assessment & Plan:    Patient Active Problem List   Diagnosis Date Noted   Viral infection 08/13/2022   Cellulitis 12/17/2021   Cellulitis of abdominal wall 12/16/2021   Syncope 12/16/2021   Hyponatremia 12/16/2021   Hypokalemia 12/16/2021   Healthcare maintenance 05/24/2019   Rash 10/05/2018   Secondary syphilis 08/16/2018   Pain, dental 08/16/2018   Screening for STDs (sexually transmitted diseases) 04/26/2018   STI (sexually transmitted infection) 04/26/2018   Routine adult health maintenance 09/05/2016   AIDS (HCC) 07/12/2015   Late latent syphilis 07/12/2015  Chronic hepatitis B (HCC) 07/12/2015   Normocytic anemia 07/12/2015   Genital herpes 07/12/2015   Hyperthyroidism 06/27/2015   Abnormal liver function test 06/27/2015   Pneumocystis jiroveci pneumonia (HCC) 06/27/2015     Problem List Items Addressed This Visit       Digestive   Chronic hepatitis B (HCC)    Previously on Entecavir and has not taken recently. Discussed importance of staying on medication and stopping/starting can increase risk of Hepatitis B flare. Check lab work. Will be covered with Biktarvy for now. No evidence of fibrosis on previous Fibrosure.       Relevant Medications   bictegravir-emtricitabine-tenofovir AF (BIKTARVY) 50-200-25 MG TABS tablet   Other Relevant Orders   BASIC METABOLIC PANEL WITH GFR   Hepatitis B e antibody   Hepatitis B e antigen   Hepatitis B DNA, ultraquantitative, PCR   Hepatitis B surface antibody,quantitative   Hepatitis B surface antigen   Hepatic function panel   CBC   Liver Fibrosis, FibroTest-ActiTest     Other   AIDS [facility])    Mr. Broome has been off medication for at least 2 months and has poorly controlled virus.  Discussed importance of taking medication on a daily basis to reduce risk of further development of resistance given his already resistant profile.  Will  change to Entecavir/Prezcobix to [employer].  May need to add a supplemental agent given K70R and has significant NNRTI and NRTI resistance. Not a candidate for Cabevnua but may consider lenacaprivir.Check lab work. Will start Bactrim for OI prophylaxis pending lab work results. Plan for follow up in 1 month or sooner if needed.       Relevant Medications   bictegravir-emtricitabine-tenofovir AF (BIKTARVY) 50-200-25 MG TABS tablet   Other Relevant Orders   BASIC METABOLIC PANEL WITH GFR   CBC   HIV-1 RNA quant-no reflex-bld   T-helper cell (CD4)- ([facility] clinic only)   Screening for STDs (sexually transmitted diseases)   Relevant Orders   RPR   Urine cytology ancillary only   Cytology (oral, anal, urethral) ancillary only   Cytology (oral, anal, urethral) ancillary only   Healthcare maintenance    Discussed importance of safe sexual practice and condom use. Condoms and STD testing offered.  Prevnar 20 updated.  Anal pap collected.  Due for routine dental care and he will schedule independently.       Other Visit Diagnoses     Need for pneumococcal 20-valent conjugate vaccination    -  Primary   Relevant Orders   Pneumococcal conjugate vaccine 20-valent (Prevnar-20) (Completed)   Screening for rectal cancer       Relevant Orders   Cytology - PAP( [place])   Encounter for immunization       Relevant Orders   Flu vaccine trivalent PF, 6mos and older(Flulaval,Afluria,Fluarix,Fluzone) (Completed)        I have discontinued Lamaj Kloehn's ondansetron, senna-docusate, Tivicay, levocetirizine, Prezcobix, sulfamethoxazole-trimethoprim, entecavir, levofloxacin, ibuprofen, HYDROcodone-acetaminophen, and potassium chloride SA. I am also having him start on Biktarvy.   Meds ordered this encounter  Medications   bictegravir-emtricitabine-tenofovir AF (BIKTARVY) 50-200-25 MG TABS tablet    Sig: Take 1 tablet by mouth daily.    Dispense:  30 tablet    Refill:  5    Order Specific  Question:   Supervising Provider    Answer:   Judyann Munson [phone]    Order Specific Question:   Prescription Type:    Answer:   Renewal     Follow-up:  Return in about 1 month (around 04/22/2023). or sooner if needed.    Marcos Eke, MSN, FNP-C Nurse Practitioner [facility] for Infectious Disease [facility] Main number: [phone]

## 2023-03-24 LAB — URINE CYTOLOGY ANCILLARY ONLY
Chlamydia: NEGATIVE
Comment: NEGATIVE
Comment: NORMAL
Neisseria Gonorrhea: NEGATIVE

## 2023-03-24 LAB — CYTOLOGY, (ORAL, ANAL, URETHRAL) ANCILLARY ONLY
Chlamydia: NEGATIVE
Chlamydia: NEGATIVE
Comment: NEGATIVE
Comment: NEGATIVE
Comment: NORMAL
Comment: NORMAL
Neisseria Gonorrhea: NEGATIVE
Neisseria Gonorrhea: NEGATIVE

## 2023-03-24 LAB — T-HELPER CELL (CD4) - (RCID CLINIC ONLY)
CD4 % Helper T Cell: 6 % — ABNORMAL LOW (ref 33–65)
CD4 T Cell Abs: 75 /uL — ABNORMAL LOW (ref 400–1790)

## 2023-03-24 MED ORDER — BICTEGRAVIR-EMTRICITAB-TENOFOV 50-200-25 MG PO TABS: 1.0000 | ORAL_TABLET | Freq: Every day | ORAL | Status: AC

## 2023-03-24 NOTE — Addendum Note (Signed)
Addended by: Jennette Kettle on: 03/24/2023 10:56 AM   Modules accepted: Orders

## 2023-03-24 NOTE — Telephone Encounter (Signed)
Medication Samples have been provided to the patient.  Drug name: Biktarvy        Strength: 50/200/25 mg       Qty: 7 tablets (1 bottles) LOT: CSCFVA   Exp.Date: 10/26  Dosing instructions: Take one tablet by mouth once daily  The patient has been instructed regarding the correct time, dose, and frequency of taking this medication, including desired effects and most common side effects.   Margarite Gouge, PharmD, CPP, BCIDP, AAHIVP Clinical Pharmacist Practitioner Infectious Diseases Clinical Pharmacist [facility] for Infectious Disease

## 2023-03-30 ENCOUNTER — Ambulatory Visit: Payer: Self-pay | Admitting: Family

## 2023-03-31 ENCOUNTER — Other Ambulatory Visit: Payer: Self-pay

## 2023-03-31 ENCOUNTER — Other Ambulatory Visit (HOSPITAL_COMMUNITY): Payer: Self-pay

## 2023-03-31 ENCOUNTER — Other Ambulatory Visit: Payer: Self-pay | Admitting: Family

## 2023-03-31 MED ORDER — PREZCOBIX 800-150 MG PO TABS
1.0000 | ORAL_TABLET | Freq: Every day | ORAL | 5 refills | Status: DC
  Filled 2023-03-31: qty 30, 30d supply, fill #0
  Filled 2023-05-21 (×2): qty 30, 30d supply, fill #1

## 2023-03-31 MED ORDER — SULFAMETHOXAZOLE-TRIMETHOPRIM 800-160 MG PO TABS
1.0000 | ORAL_TABLET | Freq: Every day | ORAL | 4 refills | Status: DC
  Filled 2023-03-31 (×2): qty 30, 30d supply, fill #0

## 2023-03-31 NOTE — Progress Notes (Signed)
Specialty Pharmacy Refill Coordination Note  Daniel Dunlap is a 42 y.o. male contacted today regarding refills of specialty medication(s) Darunavir-Cobicistat   Patient requested Delivery   Delivery date: 04/02/23   Verified address: [address] [place] [address]   Medication will be filled on 04/01/23.

## 2023-04-01 ENCOUNTER — Other Ambulatory Visit: Payer: Self-pay

## 2023-04-01 LAB — LIVER FIBROSIS, FIBROTEST-ACTITEST
ALT: 35 U/L (ref 9–46)
Alpha-2-Macroglobulin: 188 mg/dL (ref 106–279)
Apolipoprotein A1: 138 mg/dL (ref 94–176)
Bilirubin: 0.4 mg/dL (ref 0.2–1.2)
Fibrosis Score: 0.12
GGT: 22 U/L (ref 3–95)
Haptoglobin: 172 mg/dL (ref 43–212)
Necroinflammat ACT Score: 0.15
Reference ID: 5231035

## 2023-04-01 LAB — HEPATITIS B DNA, ULTRAQUANTITATIVE, PCR: Hepatitis B virus DNA: >9 {Log_IU}/mL — ABNORMAL HIGH

## 2023-04-01 LAB — RPR TITER: RPR Titer: 1:4 {titer} — ABNORMAL HIGH

## 2023-04-01 LAB — BASIC METABOLIC PANEL WITH GFR
BUN: 12 mg/dL (ref 7–25)
CO2: 24 mmol/L (ref 20–32)
Calcium: 9 mg/dL (ref 8.6–10.3)
Chloride: 101 mmol/L (ref 98–110)
Creat: 0.8 mg/dL (ref 0.60–1.29)
Glucose, Bld: 89 mg/dL (ref 65–99)
Potassium: 3.9 mmol/L (ref 3.5–5.3)
Sodium: 134 mmol/L — ABNORMAL LOW (ref 135–146)
eGFR: 113 mL/min/{1.73_m2} (ref >=60)

## 2023-04-01 LAB — CBC
HCT: 40.4 % (ref 38.5–50.0)
Hemoglobin: 12.8 g/dL — ABNORMAL LOW (ref 13.2–17.1)
MCH: 27.3 pg (ref 27.0–33.0)
MCHC: 31.7 g/dL — ABNORMAL LOW (ref 32.0–36.0)
MCV: 86.1 fL (ref 80.0–100.0)
MPV: 11 fL (ref 7.5–12.5)
Platelets: 359 10*3/uL (ref 140–400)
RBC: 4.69 10*6/uL (ref 4.20–5.80)
RDW: 13.9 % (ref 11.0–15.0)
WBC: 3.9 10*3/uL (ref 3.8–10.8)

## 2023-04-01 LAB — CYTOLOGY - PAP
Adequacy: ABSENT
Comment: NEGATIVE
Diagnosis: UNDETERMINED — AB
High risk HPV: POSITIVE — AB

## 2023-04-01 LAB — HEPATIC FUNCTION PANEL
AG Ratio: 0.8 (calc) — ABNORMAL LOW (ref 1.0–2.5)
ALT: 35 U/L (ref 9–46)
AST: 45 U/L — ABNORMAL HIGH (ref 10–40)
Albumin: 3.7 g/dL (ref 3.6–5.1)
Alkaline phosphatase (APISO): 70 U/L (ref 36–130)
Bilirubin, Direct: 0.1 mg/dL (ref 0.0–0.2)
Globulin: 4.7 g/dL — ABNORMAL HIGH (ref 1.9–3.7)
Indirect Bilirubin: 0.2 mg/dL (ref 0.2–1.2)
Total Bilirubin: 0.3 mg/dL (ref 0.2–1.2)
Total Protein: 8.4 g/dL — ABNORMAL HIGH (ref 6.1–8.1)

## 2023-04-01 LAB — HEPATITIS B SURFACE ANTIBODY, QUANTITATIVE: Hep B S AB Quant (Post): <5 m[IU]/mL — ABNORMAL LOW (ref >=10)

## 2023-04-01 LAB — T PALLIDUM AB: T Pallidum Abs: POSITIVE — AB

## 2023-04-01 LAB — RPR: RPR Ser Ql: REACTIVE — AB

## 2023-04-01 LAB — HIV-1 RNA QUANT-NO REFLEX-BLD
HIV 1 RNA Quant: 121000 {copies}/mL — ABNORMAL HIGH
HIV-1 RNA Quant, Log: 5.08 {Log_copies}/mL — ABNORMAL HIGH

## 2023-04-01 LAB — HEPATITIS B E ANTIGEN: Hep B E Ag: REACTIVE — AB

## 2023-04-01 LAB — HEPATITIS B E ANTIBODY: Hep B E Ab: NONREACTIVE

## 2023-04-01 LAB — HEPATITIS B SURFACE ANTIGEN: Hepatitis B Surface Ag: REACTIVE — AB

## 2023-04-09 ENCOUNTER — Emergency Department (HOSPITAL_COMMUNITY)
Admission: EM | Admit: 2023-04-09 | Discharge: 2023-04-10 | Discharge status: Against Medical Advice | Payer: BC Managed Care – PPO | Source: Home / Self Care

## 2023-04-21 ENCOUNTER — Other Ambulatory Visit: Payer: Self-pay

## 2023-04-24 ENCOUNTER — Other Ambulatory Visit: Payer: Self-pay

## 2023-04-27 ENCOUNTER — Other Ambulatory Visit (HOSPITAL_COMMUNITY): Payer: Self-pay

## 2023-04-30 ENCOUNTER — Ambulatory Visit: Payer: BC Managed Care – PPO | Admitting: Family

## 2023-05-20 ENCOUNTER — Other Ambulatory Visit (HOSPITAL_COMMUNITY): Payer: Self-pay

## 2023-05-21 ENCOUNTER — Other Ambulatory Visit: Payer: Self-pay

## 2023-05-21 ENCOUNTER — Other Ambulatory Visit (HOSPITAL_COMMUNITY): Payer: Self-pay

## 2023-05-21 NOTE — Progress Notes (Signed)
Specialty Pharmacy Refill Coordination Note  Daniel Dunlap is a 43 y.o. male contacted today regarding refills of specialty medication(s) Bictegravir-Emtricitab-Tenofov [employer]); Darunavir-Cobicistat (Prezcobix)   Patient requested Delivery   Delivery date: 05/25/23   Verified address: [address] [place] [address]   Medication will be filled on 05/22/23.

## 2023-05-22 ENCOUNTER — Other Ambulatory Visit (HOSPITAL_COMMUNITY): Payer: Self-pay

## 2023-05-22 ENCOUNTER — Other Ambulatory Visit: Payer: Self-pay

## 2023-05-27 ENCOUNTER — Other Ambulatory Visit: Payer: Self-pay | Admitting: Family

## 2023-05-27 DIAGNOSIS — R85619 Unspecified abnormal cytological findings in specimens from anus: Secondary | ICD-10-CM

## 2023-06-08 ENCOUNTER — Other Ambulatory Visit: Payer: Self-pay

## 2023-06-10 ENCOUNTER — Other Ambulatory Visit: Payer: Self-pay

## 2023-06-16 ENCOUNTER — Other Ambulatory Visit: Payer: Self-pay

## 2023-09-07 NOTE — Progress Notes (Signed)
 The ASCVD Risk score (Arnett DK, et al., 2019) failed to calculate for the following reasons:   Cannot find a previous HDL lab   Cannot find a previous total cholesterol lab  Arlon Bergamo, BSN, RN

## 2023-09-18 ENCOUNTER — Other Ambulatory Visit (HOSPITAL_COMMUNITY): Payer: Self-pay

## 2023-10-02 ENCOUNTER — Telehealth: Payer: Self-pay | Admitting: Pharmacist

## 2023-10-02 ENCOUNTER — Other Ambulatory Visit: Payer: Self-pay | Admitting: Pharmacist

## 2023-10-02 NOTE — Progress Notes (Signed)
 Unable to reach patient via phone or MyChart since January. No showed clinic appointment. Inactivating.  Nicklas Barns, PharmD, CPP, BCIDP, AAHIVP Clinical Pharmacist Practitioner Infectious Diseases Clinical Pharmacist [facility] for Infectious Disease

## 2023-10-02 NOTE — Telephone Encounter (Signed)
 Patient needs follow up with Erla Haw as available. Pharmacy team has been unable to contact him for refills since January, so I am inactivating him from our system until he can come in for a visit.   Daniel Dunlap, PharmD, CPP, BCIDP, AAHIVP Clinical Pharmacist Practitioner Infectious Diseases Clinical Pharmacist [facility] for Infectious Disease

## 2023-10-05 ENCOUNTER — Telehealth: Payer: Self-pay

## 2023-10-05 NOTE — Telephone Encounter (Signed)
 Left patient a voice mail to call back to schedule an appointment with Gregory Calone.

## 2023-10-06 NOTE — Progress Notes (Unsigned)
   HPI: Daniel Dunlap is a 43 y.o. male who presents to the [facility] clinic today for STI testing.  Patient Active Problem List   Diagnosis Date Noted   Viral infection 08/13/2022   Cellulitis 12/17/2021   Cellulitis of abdominal wall 12/16/2021   Syncope 12/16/2021   Hyponatremia 12/16/2021   Hypokalemia 12/16/2021   Healthcare maintenance 05/24/2019   Rash 10/05/2018   Secondary syphilis 08/16/2018   Pain, dental 08/16/2018   Screening for STDs (sexually transmitted diseases) 04/26/2018   STI (sexually transmitted infection) 04/26/2018   Routine adult health maintenance 09/05/2016   AIDS (HCC) 07/12/2015   Late latent syphilis 07/12/2015   Chronic hepatitis B (HCC) 07/12/2015   Normocytic anemia 07/12/2015   Genital herpes 07/12/2015   Hyperthyroidism 06/27/2015   Abnormal liver function test 06/27/2015   Pneumocystis jiroveci pneumonia (HCC) 06/27/2015    Patient's Medications  New Prescriptions   No medications on file  Previous Medications   BICTEGRAVIR-EMTRICITABINE -TENOFOVIR  AF (BIKTARVY ) 50-200-25 MG TABS TABLET    Take 1 tablet by mouth daily.   DARUNAVIR -COBICISTAT  (PREZCOBIX ) 800-150 MG TABLET    Take 1 tablet by mouth daily with breakfast. Swallow whole. Do NOT crush, break or chew tablets. Take with food.   SULFAMETHOXAZOLE -TRIMETHOPRIM  (BACTRIM  DS) 800-160 MG TABLET    Take 1 tablet by mouth daily.  Modified Medications   No medications on file  Discontinued Medications   No medications on file    Assessment: Today patient states he is primarily interested in STI screening today. He has screening done twice a year. He reported 2 new sexual partners recently and he uses condoms about 50% of the time.   When questioned about his adherence to HIV regimen, he stated that he is not a fan of pills in general. He reports missing ~3 doses/week. He did agree to have another prescription sent to his home. He has not been taking Bactrim . He is hesitant with medications  because of the side effects he has researched associated with them. He mentioned experiencing very low appetite and insomnia which he attributed to his medications. We discussed the possibility of these symptoms being related to uncontrolled HIV/HBV.  Patient stated multiple times he was aware of the risks of sporadic medication adherence. He knows multiple people with a history of hepatitis.  Plan: - STI screening: RPR, urine/pharyngeal/rectal GC/CT swabs for cytology today - Additionally collected HIV RNA, CD4 count, HBV DNA, and CMP - F/u results to see if treatment is needed  Tolu Meelah Tallo, PharmD [employer] PGY-1

## 2023-10-06 NOTE — Progress Notes (Deleted)
   HPI: Daniel Dunlap is a 43 y.o. male who presents to the [facility] clinic today for STI testing.  Patient Active Problem List   Diagnosis Date Noted   Viral infection 08/13/2022   Cellulitis 12/17/2021   Cellulitis of abdominal wall 12/16/2021   Syncope 12/16/2021   Hyponatremia 12/16/2021   Hypokalemia 12/16/2021   Healthcare maintenance 05/24/2019   Rash 10/05/2018   Secondary syphilis 08/16/2018   Pain, dental 08/16/2018   Screening for STDs (sexually transmitted diseases) 04/26/2018   STI (sexually transmitted infection) 04/26/2018   Routine adult health maintenance 09/05/2016   AIDS (HCC) 07/12/2015   Late latent syphilis 07/12/2015   Chronic hepatitis B (HCC) 07/12/2015   Normocytic anemia 07/12/2015   Genital herpes 07/12/2015   Hyperthyroidism 06/27/2015   Abnormal liver function test 06/27/2015   Pneumocystis jiroveci pneumonia (HCC) 06/27/2015    Patient's Medications  New Prescriptions   No medications on file  Previous Medications   BICTEGRAVIR-EMTRICITABINE -TENOFOVIR  AF (BIKTARVY ) 50-200-25 MG TABS TABLET    Take 1 tablet by mouth daily.   DARUNAVIR -COBICISTAT  (PREZCOBIX ) 800-150 MG TABLET    Take 1 tablet by mouth daily with breakfast. Swallow whole. Do NOT crush, break or chew tablets. Take with food.   SULFAMETHOXAZOLE -TRIMETHOPRIM  (BACTRIM  DS) 800-160 MG TABLET    Take 1 tablet by mouth daily.  Modified Medications   No medications on file  Discontinued Medications   No medications on file    Assessment: ***  Plan: - STI screening: HIV antibody, RPR, urine/rectal/pharyngeal GC/CT swabs for cytology today - F/u results to see if treatment is needed  Estela Held, PharmD PGY-2 Infectious Diseases Pharmacy Resident [facility] 10/06/2023 3:33 PM

## 2023-10-07 ENCOUNTER — Other Ambulatory Visit: Payer: Self-pay

## 2023-10-07 ENCOUNTER — Other Ambulatory Visit (HOSPITAL_COMMUNITY)
Admission: RE | Admit: 2023-10-07 | Discharge: 2023-10-07 | Disposition: A | Discharge status: Home / Self Care | Source: Ambulatory Visit | Attending: Family | Admitting: Family

## 2023-10-07 ENCOUNTER — Ambulatory Visit (INDEPENDENT_AMBULATORY_CARE_PROVIDER_SITE_OTHER): Admitting: Pharmacist

## 2023-10-07 DIAGNOSIS — Z113 Encounter for screening for infections with a predominantly sexual mode of transmission: Secondary | ICD-10-CM

## 2023-10-07 DIAGNOSIS — B20 Human immunodeficiency virus [HIV] disease: Secondary | ICD-10-CM | POA: Diagnosis not present

## 2023-10-07 DIAGNOSIS — B180 Chronic viral hepatitis B with delta-agent: Secondary | ICD-10-CM | POA: Diagnosis not present

## 2023-10-08 LAB — CYTOLOGY, (ORAL, ANAL, URETHRAL) ANCILLARY ONLY
Chlamydia: NEGATIVE
Chlamydia: NEGATIVE
Comment: NEGATIVE
Comment: NEGATIVE
Comment: NORMAL
Comment: NORMAL
Neisseria Gonorrhea: NEGATIVE
Neisseria Gonorrhea: NEGATIVE

## 2023-10-08 LAB — URINE CYTOLOGY ANCILLARY ONLY
Chlamydia: NEGATIVE
Comment: NEGATIVE
Comment: NORMAL
Neisseria Gonorrhea: NEGATIVE

## 2023-10-08 LAB — T-HELPER CELLS (CD4) COUNT (NOT AT ARMC)
CD4 % Helper T Cell: 5 % — ABNORMAL LOW (ref 33–65)
CD4 T Cell Abs: 68 /uL — ABNORMAL LOW (ref 400–1790)

## 2023-10-20 LAB — COMPREHENSIVE METABOLIC PANEL WITH GFR
AG Ratio: 0.8 (calc) — ABNORMAL LOW (ref 1.0–2.5)
ALT: 12 U/L (ref 9–46)
AST: 23 U/L (ref 10–40)
Albumin: 3.8 g/dL (ref 3.6–5.1)
Alkaline phosphatase (APISO): 59 U/L (ref 36–130)
BUN: 15 mg/dL (ref 7–25)
CO2: 30 mmol/L (ref 20–32)
Calcium: 9.1 mg/dL (ref 8.6–10.3)
Chloride: 99 mmol/L (ref 98–110)
Creat: 0.96 mg/dL (ref 0.60–1.29)
Globulin: 4.5 g/dL — ABNORMAL HIGH (ref 1.9–3.7)
Glucose, Bld: 92 mg/dL (ref 65–99)
Potassium: 3.8 mmol/L (ref 3.5–5.3)
Sodium: 136 mmol/L (ref 135–146)
Total Bilirubin: 0.3 mg/dL (ref 0.2–1.2)
Total Protein: 8.3 g/dL — ABNORMAL HIGH (ref 6.1–8.1)
eGFR: 101 mL/min/{1.73_m2} (ref >=60)

## 2023-10-20 LAB — HIV RNA, RTPCR W/R GT (RTI, PI,INT)
HIV 1 RNA Quant: 40400 {copies}/mL — ABNORMAL HIGH
HIV-1 RNA Quant, Log: 4.61 {Log_copies}/mL — ABNORMAL HIGH

## 2023-10-20 LAB — RPR: RPR Ser Ql: REACTIVE — AB

## 2023-10-20 LAB — HEPATITIS B DNA, ULTRAQUANTITATIVE, PCR
Hepatitis B DNA: 156 [IU]/mL — ABNORMAL HIGH
Hepatitis B virus DNA: 2.19 {Log_IU}/mL — ABNORMAL HIGH

## 2023-10-20 LAB — HIV-1 INTEGRASE GENOTYPE

## 2023-10-20 LAB — T PALLIDUM AB: T Pallidum Abs: POSITIVE — AB

## 2023-10-20 LAB — RPR TITER: RPR Titer: 1:8 {titer} — ABNORMAL HIGH

## 2023-10-20 LAB — HIV-1 GENOTYPE: HIV-1 Genotype: DETECTED — AB

## 2023-10-21 ENCOUNTER — Other Ambulatory Visit: Payer: Self-pay

## 2023-10-21 ENCOUNTER — Telehealth: Payer: Self-pay

## 2023-10-21 ENCOUNTER — Other Ambulatory Visit (HOSPITAL_COMMUNITY): Payer: Self-pay

## 2023-10-21 MED ORDER — BIKTARVY 50-200-25 MG PO TABS
1.0000 | ORAL_TABLET | Freq: Every day | ORAL | 2 refills | Status: DC
  Filled 2023-10-21: qty 30, 30d supply, fill #0

## 2023-10-21 MED ORDER — PREZCOBIX 800-150 MG PO TABS
1.0000 | ORAL_TABLET | Freq: Every day | ORAL | 2 refills | Status: DC
  Filled 2023-10-21: qty 30, 30d supply, fill #0

## 2023-10-21 MED ORDER — SULFAMETHOXAZOLE-TRIMETHOPRIM 800-160 MG PO TABS
1.0000 | ORAL_TABLET | Freq: Every day | ORAL | 2 refills | Status: DC
  Filled 2023-10-21 – 2023-10-22 (×2): qty 30, 30d supply, fill #0

## 2023-10-21 NOTE — Telephone Encounter (Signed)
 Discussed lab results with Derwood. He requested refills for Biktarvy , Prezcobix , and Bactrim  to [facility]. Approved delivery.

## 2023-10-21 NOTE — Progress Notes (Signed)
 Specialty Pharmacy Refill Coordination Note  Daniel Dunlap is a 43 y.o. male contacted today regarding refills of specialty medication(s) Bictegravir-Emtricitab-Tenofov (Biktarvy ); Darunavir -Cobicistat  (Prezcobix )   Patient requested Delivery   Delivery date: 10/23/23   Verified address: [address] [place] [address]   Medication will be filled on 10/22/23.

## 2023-10-22 ENCOUNTER — Other Ambulatory Visit (HOSPITAL_COMMUNITY): Payer: Self-pay

## 2023-10-22 ENCOUNTER — Other Ambulatory Visit: Payer: Self-pay

## 2023-11-02 ENCOUNTER — Ambulatory Visit: Admitting: Family

## 2023-11-03 ENCOUNTER — Telehealth: Payer: Self-pay

## 2023-11-03 NOTE — Telephone Encounter (Signed)
 Detectable Viral Load Intervention (DVL)  Most recent VL:  HIV 1 RNA Quant  Date Value Ref Range Status  10/07/2023 40,400 (H) copies/mL Final  03/23/2023 121,000 (H) Copies/mL Final  08/12/2022 362 (H) copies/mL Final    Last Clinic Visit: 10/07/23  Current ART regimen: Biktarvy  and Prezcobix   Appointment status: patient has future appointment scheduled  Medication last dispensed (per chart review):   Medication Adherence   What pharmacy do you use for your ART? [facility]  Do you pick up your medication at the pharmacy or is it mailed to you? delivered by pharmacy   How often do you miss a dose your ART? never or almost never  Are you experiencing any side effects with your ART?  Some- nausea. Has improved   Are you having any trouble remembering what medication(s) you are supposed to take or how you are supposed to take them? No   What helps you remember to take your medication(s)? Alarm+ pill tray    Barriers to Care   Lack of transportation to medical appointments? no  2. Housing instability? No   3. If you are currently employed, are you having difficulty taking time off of work for medical appointments? No   4. Financial concerns (rent, utilities, etc.) no   5. Lack of consistent access to food? No   6. Trouble remembering and attending your appointments? No   7. Are you experiencing any other barriers that make it hard for you to come to appointments or take medication regularly? No    Interventions   Called patient to discuss medication adherence and possible barriers to care. Reschedule missed appt. Pt did state he has issues with side effects from ART, but this has improved. Denies missing any doses of ART.  Understand we will recheck VL at next appt for update on where he stands. Will forward message to provider. Lorenda CHRISTELLA Code, RMA

## 2023-11-06 ENCOUNTER — Other Ambulatory Visit (HOSPITAL_COMMUNITY): Payer: Self-pay

## 2023-11-09 ENCOUNTER — Other Ambulatory Visit (HOSPITAL_COMMUNITY): Payer: Self-pay

## 2023-11-10 ENCOUNTER — Other Ambulatory Visit: Payer: Self-pay

## 2023-11-10 ENCOUNTER — Other Ambulatory Visit (HOSPITAL_COMMUNITY): Payer: Self-pay

## 2023-11-10 ENCOUNTER — Ambulatory Visit: Payer: Self-pay | Admitting: Family

## 2023-11-10 ENCOUNTER — Ambulatory Visit: Payer: Self-pay

## 2023-11-10 ENCOUNTER — Encounter: Payer: Self-pay | Admitting: Family

## 2023-11-10 VITALS — BP 116/78 | HR 65 | Temp 98.2°F | Ht 70.0 in | Wt 200.2 lb

## 2023-11-10 DIAGNOSIS — B181 Chronic viral hepatitis B without delta-agent: Secondary | ICD-10-CM

## 2023-11-10 DIAGNOSIS — A528 Late syphilis, latent: Secondary | ICD-10-CM | POA: Diagnosis not present

## 2023-11-10 DIAGNOSIS — Z113 Encounter for screening for infections with a predominantly sexual mode of transmission: Secondary | ICD-10-CM

## 2023-11-10 DIAGNOSIS — B20 Human immunodeficiency virus [HIV] disease: Secondary | ICD-10-CM

## 2023-11-10 MED ORDER — PREZCOBIX 800-150 MG PO TABS
1.0000 | ORAL_TABLET | Freq: Every day | ORAL | 2 refills | Status: DC
  Filled 2023-11-10 – 2023-11-20 (×5): qty 30, 30d supply, fill #0
  Filled 2023-12-16: qty 30, 30d supply, fill #1
  Filled 2024-01-15: qty 30, 30d supply, fill #2

## 2023-11-10 MED ORDER — SULFAMETHOXAZOLE-TRIMETHOPRIM 800-160 MG PO TABS
1.0000 | ORAL_TABLET | Freq: Every day | ORAL | 2 refills | Status: DC
  Filled 2023-11-10 – 2023-11-20 (×4): qty 30, 30d supply, fill #0
  Filled 2024-05-16: qty 30, 30d supply, fill #1

## 2023-11-10 MED ORDER — BIKTARVY 50-200-25 MG PO TABS
1.0000 | ORAL_TABLET | Freq: Every day | ORAL | 2 refills | Status: DC
  Filled 2023-11-10 – 2023-11-20 (×5): qty 30, 30d supply, fill #0
  Filled 2023-12-16: qty 30, 30d supply, fill #1
  Filled 2024-01-15: qty 30, 30d supply, fill #2

## 2023-11-10 NOTE — Assessment & Plan Note (Signed)
 Syphilis titer up to 1: 8 from 1: 4 and previous treatment at 1: 32.  No signs of new infection or indications for treatment.  Continue to monitor RPR.

## 2023-11-10 NOTE — Assessment & Plan Note (Signed)
 Mr. Garrels has reported improved adherence with good tolerance to Biktarvy  and Prezcobix  supplemented with Bactrim  for OI prophylaxis.  Reviewed previous lab work and counseled on importance of taking medications on a regular basis to reduce risk of disease progression and complications in the future as he remains at significant risk for opportunistic infection with a CD4 count of 68.  He will apply for financial assistance through [facility] or [employer].  Social determinants of health reviewed with no interventions indicated.  Check blood work.  Continue current dose of Biktarvy  and Prezcobix  with Bactrim  for OI prophylaxis.  Plan for follow-up in 1 month or sooner if needed.

## 2023-11-10 NOTE — Patient Instructions (Addendum)
Nice to see you.  We will check your lab work today.  Continue to take your medication daily as prescribed.  Plan for follow up in 1 months or sooner if needed with lab work on the same day.  Have a great day and stay safe!  

## 2023-11-10 NOTE — Progress Notes (Signed)
 Brief Narrative   Patient ID: Daniel Dunlap, male    DOB: Oct 23, 1980, 43 y.o.   MRN: [id number]  Daniel Dunlap is a 43 y/o AA male diagnosed with AIDS/HIV in January 2009 with risk factor of MSM. Initial viral load was >100,000 and CD4 count 17. Cumulative Genosure with M184V (lamivudine and emtricitabine ) and E138A (prob. Rilpivirine). History of PJP at diagnosis. Coinfected with Hepatitis B. [id number] negative. Entered care at [facility] Stage 3. ART experience with Atripla, Darunavir /ritonavir , Tenofovir , Abacavir , and now Symtuza /Tivicay .   Subjective:   Chief Complaint  Patient presents with   Follow-up    B20, Hepatitis B    HPI:  Daniel Dunlap is a 43 y.o. male with HIV disease last seen on 10/07/2023 by Charlott Flowers, RPH-CPP with poorly controlled virus secondary to less than optimal Dunlap to medication.  Viral load was 40,400 with CD4 count 68.  RPR titer was 1: 8 with previous treatment at 1: 32.  Kidney function, liver function, electrolytes within normal ranges.  Here today for follow-up.  Daniel Dunlap and good tolerance to Biktarvy , Prezcobix , and Bactrim .  He does not like taking pills.  Recently quit his job secondary to increased stress and decreased sleep and is seeking further employment and has questions about financial assistance.  Concerned his medications may be increasing his levels of anxiety and decreased sleep.  Housing, transportation, and access to food remained stable.  Healthcare maintenance reviewed.  Condoms and site-specific STD testing offered.  Denies fevers, chills, night sweats, headaches, changes in vision, neck pain/stiffness, nausea, diarrhea, vomiting, lesions or rashes.  Lab Results  Component Value Date   CD4TCELL 5 (L) 10/07/2023   CD4TABS 68 (L) 10/07/2023   Lab Results  Component Value Date   HIV1RNAQUANT 40,400 (H) 10/07/2023     No Known Allergies    Outpatient Medications Prior to Visit  Medication Sig  Dispense Refill   bictegravir-emtricitabine -tenofovir  AF (BIKTARVY ) 50-200-25 MG TABS tablet Take 1 tablet by mouth daily. 30 tablet 2   darunavir -cobicistat  (PREZCOBIX ) 800-150 MG tablet Take 1 tablet by mouth daily with breakfast. Swallow whole. Do NOT crush, break or chew tablets. Take with food. 30 tablet 2   sulfamethoxazole -trimethoprim  (BACTRIM  DS) 800-160 MG tablet Take 1 tablet by mouth daily. 30 tablet 2   No facility-administered medications prior to visit.     Past Medical History:  Diagnosis Date   Genital warts    HIV infection (HCC)    HPV (human papilloma virus) infection    Thyroid  disease      No past surgical history on file.      Review of Systems  Constitutional:  Negative for appetite change, chills, fatigue, fever and unexpected weight change.  Eyes:  Negative for visual disturbance.  Respiratory:  Negative for cough, chest tightness, shortness of breath and wheezing.   Cardiovascular:  Negative for chest pain and leg swelling.  Gastrointestinal:  Negative for abdominal pain, constipation, diarrhea, nausea and vomiting.  Genitourinary:  Negative for dysuria, flank pain, frequency, genital sores, hematuria and urgency.  Skin:  Negative for rash.  Allergic/Immunologic: Negative for immunocompromised state.  Neurological:  Negative for dizziness and headaches.     Objective:   BP 116/78   Pulse 65   Temp 98.2 F (36.8 C) (Oral)   Ht 5' 10 (1.778 m)   Wt 200 lb 3.2 oz (90.8 kg)   SpO2 95%   BMI 28.73 kg/m  Nursing note and vital signs reviewed.  Physical  Exam Constitutional:      General: He is not in acute distress.    Appearance: He is well-developed.  Eyes:     Conjunctiva/sclera: Conjunctivae normal.  Cardiovascular:     Rate and Rhythm: Normal rate and regular rhythm.     Heart sounds: Normal heart sounds. No murmur heard.    No friction rub. No gallop.  Pulmonary:     Effort: Pulmonary effort is normal. No respiratory distress.      Breath sounds: Normal breath sounds. No wheezing or rales.  Chest:     Chest wall: No tenderness.  Abdominal:     General: Bowel sounds are normal.     Palpations: Abdomen is soft.     Tenderness: There is no abdominal tenderness.  Musculoskeletal:     Cervical back: Neck supple.  Lymphadenopathy:     Cervical: No cervical adenopathy.  Skin:    General: Skin is warm and dry.     Findings: No rash.  Neurological:     Mental Status: He is alert and oriented to person, place, and time.  Psychiatric:        Mood and Affect: Mood normal.          03/23/2023   11:06 AM 08/12/2022    3:13 PM 12/13/2020   11:13 AM 09/11/2020    9:16 AM 08/14/2020    9:34 AM  Depression screen PHQ 2/9  Decreased Interest 0 0 0 0 0  Down, Depressed, Hopeless 0 0 0 0 0  PHQ - 2 Score 0 0 0 0 0         No data to display           The ASCVD Risk score (Arnett DK, et al., 2019) failed to calculate for the following reasons:   Cannot find a previous HDL lab   Cannot find a previous total cholesterol lab      Assessment & Plan:    Patient Active Problem List   Diagnosis Date Noted   Viral infection 08/13/2022   Cellulitis 12/17/2021   Cellulitis of abdominal wall 12/16/2021   Syncope 12/16/2021   Hyponatremia 12/16/2021   Hypokalemia 12/16/2021   Healthcare maintenance 05/24/2019   Rash 10/05/2018   Secondary syphilis 08/16/2018   Pain, dental 08/16/2018   Screening for STDs (sexually transmitted diseases) 04/26/2018   STI (sexually transmitted infection) 04/26/2018   Routine adult health maintenance 09/05/2016   AIDS (HCC) 07/12/2015   Late latent syphilis 07/12/2015   Chronic hepatitis B (HCC) 07/12/2015   Normocytic anemia 07/12/2015   Genital herpes 07/12/2015   Hyperthyroidism 06/27/2015   Abnormal liver function test 06/27/2015   Pneumocystis jiroveci pneumonia (HCC) 06/27/2015     Problem List Items Addressed This Visit       Digestive   Chronic hepatitis B (HCC)    Chronic hep B likely poorly controlled with less than optimal Dunlap to Biktarvy  previously.  Discussed importance of taking medication regularly to reduce risk of hepatitis flare when medications are stopped abruptly.  Check hepatitis B lab work.  Continue current dose of Biktarvy  containing tenofovir .      Relevant Medications   bictegravir-emtricitabine -tenofovir  AF (BIKTARVY ) 50-200-25 MG TABS tablet   darunavir -cobicistat  (PREZCOBIX ) 800-150 MG tablet   sulfamethoxazole -trimethoprim  (BACTRIM  DS) 800-160 MG tablet   Other Relevant Orders   Hepatitis B DNA, ultraquantitative, PCR   Hepatitis B surface antigen     Other   AIDS (HCC) - Primary   Daniel Dunlap has reported improved  Dunlap with good tolerance to Biktarvy  and Prezcobix  supplemented with Bactrim  for OI prophylaxis.  Reviewed previous lab work and counseled on importance of taking medications on a regular basis to reduce risk of disease progression and complications in the future as he remains at significant risk for opportunistic infection with a CD4 count of 68.  He will apply for financial assistance through [facility] or [employer].  Social determinants of health reviewed with no interventions indicated.  Check blood work.  Continue current dose of Biktarvy  and Prezcobix  with Bactrim  for OI prophylaxis.  Plan for follow-up in 1 month or sooner if needed.      Relevant Medications   bictegravir-emtricitabine -tenofovir  AF (BIKTARVY ) 50-200-25 MG TABS tablet   darunavir -cobicistat  (PREZCOBIX ) 800-150 MG tablet   sulfamethoxazole -trimethoprim  (BACTRIM  DS) 800-160 MG tablet   Other Relevant Orders   HIV-1 RNA quant-no reflex-bld   T-helper cell (CD4)- ([facility] clinic only)   Late latent syphilis   Syphilis titer up to 1: 8 from 1: 4 and previous treatment at 1: 32.  No signs of new infection or indications for treatment.  Continue to monitor RPR.      Relevant Medications   bictegravir-emtricitabine -tenofovir  AF (BIKTARVY ) 50-200-25  MG TABS tablet   darunavir -cobicistat  (PREZCOBIX ) 800-150 MG tablet   sulfamethoxazole -trimethoprim  (BACTRIM  DS) 800-160 MG tablet   Screening for STDs (sexually transmitted diseases)     I am having Tayt Mauger maintain his Biktarvy , Prezcobix , and sulfamethoxazole -trimethoprim .   Meds ordered this encounter  Medications   bictegravir-emtricitabine -tenofovir  AF (BIKTARVY ) 50-200-25 MG TABS tablet    Sig: Take 1 tablet by mouth daily.    Dispense:  30 tablet    Refill:  2    Supervising Provider:   SNIDER, CYNTHIA [phone]    Prescription Type::   Renewal   darunavir -cobicistat  (PREZCOBIX ) 800-150 MG tablet    Sig: Take 1 tablet by mouth daily with breakfast. Swallow whole. Do NOT crush, break or chew tablets. Take with food.    Dispense:  30 tablet    Refill:  2    Supervising Provider:   SNIDER, CYNTHIA [phone]    Prescription Type::   Renewal   sulfamethoxazole -trimethoprim  (BACTRIM  DS) 800-160 MG tablet    Sig: Take 1 tablet by mouth daily.    Dispense:  30 tablet    Refill:  2    Supervising Provider:   LUIZ CHANNEL [[id number]]     Follow-up: Return in about 1 month (around 12/11/2023). or sooner if needed.    Cathlyn July, MSN, FNP-C Nurse Practitioner [facility] for Infectious Disease [facility] Main number: [phone]

## 2023-11-10 NOTE — Assessment & Plan Note (Signed)
 Chronic hep B likely poorly controlled with less than optimal adherence to Biktarvy  previously.  Discussed importance of taking medication regularly to reduce risk of hepatitis flare when medications are stopped abruptly.  Check hepatitis B lab work.  Continue current dose of Biktarvy  containing tenofovir .

## 2023-11-11 LAB — T-HELPER CELL (CD4) - (RCID CLINIC ONLY)
CD4 % Helper T Cell: 6 % — ABNORMAL LOW (ref 33–65)
CD4 T Cell Abs: 69 /uL — ABNORMAL LOW (ref 400–1790)

## 2023-11-12 LAB — HIV-1 RNA QUANT-NO REFLEX-BLD
HIV 1 RNA Quant: <20 {copies}/mL — AB
HIV-1 RNA Quant, Log: <1.3 {Log_copies}/mL — AB

## 2023-11-12 LAB — HEPATITIS B DNA, ULTRAQUANTITATIVE, PCR
Hepatitis B DNA: 48 [IU]/mL — ABNORMAL HIGH
Hepatitis B virus DNA: 1.68 {Log_IU}/mL — ABNORMAL HIGH

## 2023-11-12 LAB — HEPATITIS B SURFACE ANTIGEN: Hepatitis B Surface Ag: REACTIVE — AB

## 2023-11-13 ENCOUNTER — Other Ambulatory Visit: Payer: Self-pay | Admitting: Pharmacy Technician

## 2023-11-13 ENCOUNTER — Other Ambulatory Visit (HOSPITAL_COMMUNITY): Payer: Self-pay

## 2023-11-13 ENCOUNTER — Other Ambulatory Visit: Payer: Self-pay

## 2023-11-13 ENCOUNTER — Telehealth: Payer: Self-pay

## 2023-11-13 NOTE — Telephone Encounter (Signed)
 Pharmacy Patient Advocate Encounter  Insurance verification completed.   The patient is insured through [employer]   Ran test claim for [employer]. Currently a quantity of 30 is a 30 day supply and the co-pay is 0.00 .   This test claim was processed through [facility]- copay amounts may vary at other pharmacies due to pharmacy/plan contracts, or as the patient moves through the different stages of their insurance plan.

## 2023-11-16 ENCOUNTER — Other Ambulatory Visit: Payer: Self-pay

## 2023-11-18 ENCOUNTER — Other Ambulatory Visit: Payer: Self-pay

## 2023-11-20 ENCOUNTER — Other Ambulatory Visit: Payer: Self-pay

## 2023-11-20 NOTE — Progress Notes (Signed)
 Specialty Pharmacy Refill Coordination Note  Daniel Dunlap is a 43 y.o. male contacted today regarding refills of specialty medication(s) Bictegravir-Emtricitab-Tenofov (Biktarvy ); Darunavir -Cobicistat  (Prezcobix )   Patient requested Delivery   Delivery date: 11/23/23   Verified address: [address] [place] [address]   Medication will be filled on 07.25.25.

## 2023-12-04 ENCOUNTER — Telehealth: Payer: Self-pay

## 2023-12-04 NOTE — Telephone Encounter (Signed)
 Patient called reporting his face was breaking out. He states it was not visible, but he knows its there. He denies any swelling of the face. He reports mild itching and little redness on his face. Patient states no new products on his face. He was requesting to be seen today and no availability. Patient already scheduled for next Friday and states he may go to urgent care

## 2023-12-07 ENCOUNTER — Other Ambulatory Visit: Payer: Self-pay

## 2023-12-07 ENCOUNTER — Ambulatory Visit (INDEPENDENT_AMBULATORY_CARE_PROVIDER_SITE_OTHER): Admitting: Pharmacist

## 2023-12-07 ENCOUNTER — Other Ambulatory Visit (HOSPITAL_COMMUNITY)
Admission: RE | Admit: 2023-12-07 | Discharge: 2023-12-07 | Disposition: A | Discharge status: Home / Self Care | Source: Ambulatory Visit | Attending: Family | Admitting: Family

## 2023-12-07 DIAGNOSIS — Z113 Encounter for screening for infections with a predominantly sexual mode of transmission: Secondary | ICD-10-CM | POA: Insufficient documentation

## 2023-12-07 DIAGNOSIS — A539 Syphilis, unspecified: Secondary | ICD-10-CM | POA: Diagnosis not present

## 2023-12-07 NOTE — Progress Notes (Signed)
   HPI: Daniel Dunlap is a 43 y.o. male who presents to the [facility] clinic today for STI testing.  Patient Active Problem List   Diagnosis Date Noted   Viral infection 08/13/2022   Cellulitis 12/17/2021   Cellulitis of abdominal wall 12/16/2021   Syncope 12/16/2021   Hyponatremia 12/16/2021   Hypokalemia 12/16/2021   Healthcare maintenance 05/24/2019   Rash 10/05/2018   Secondary syphilis 08/16/2018   Pain, dental 08/16/2018   Screening for STDs (sexually transmitted diseases) 04/26/2018   STI (sexually transmitted infection) 04/26/2018   Routine adult health maintenance 09/05/2016   AIDS (HCC) 07/12/2015   Late latent syphilis 07/12/2015   Chronic hepatitis B (HCC) 07/12/2015   Normocytic anemia 07/12/2015   Genital herpes 07/12/2015   Hyperthyroidism 06/27/2015   Abnormal liver function test 06/27/2015   Pneumocystis jiroveci pneumonia (HCC) 06/27/2015    Patient's Medications  New Prescriptions   No medications on file  Previous Medications   BICTEGRAVIR-EMTRICITABINE -TENOFOVIR  AF (BIKTARVY ) 50-200-25 MG TABS TABLET    Take 1 tablet by mouth daily.   DARUNAVIR -COBICISTAT  (PREZCOBIX ) 800-150 MG TABLET    Take 1 tablet by mouth daily with breakfast. Swallow whole. Do NOT crush, break or chew tablets. Take with food.   SULFAMETHOXAZOLE -TRIMETHOPRIM  (BACTRIM  DS) 800-160 MG TABLET    Take 1 tablet by mouth daily.  Modified Medications   No medications on file  Discontinued Medications   No medications on file    Assessment: Daniel Dunlap is here today for STI testing. He was last seen on 11/10/23 by one of our NPs, Cathlyn July. He has had issues with adherence in the past and is prescribed Biktarvy  and Prezcobix . HIV viral load was down at that visit from 40,400 (June 2025) to undetectable. CD4 count was 69.   He comes in today wishing to be tested for STIs. He states that his face is breaking out and when it did this in the past, he was positive for a STI. He did not discuss which  STI. He does not have any other symptoms besides his face breaking out. It is not visible, but he states that he can feel it. No rashes, discharge, itching, sore throat, or other s/sx of STIs. Will test today and treat him if needed. Reminded him of his appointment with Cathlyn on Friday, which he said he was coming to. Congratulated him on his undetectable HIV viral load and told him to keep up the good work. All questions answered.    Plan: - RPR and urine/rectal/pharyngeal cytologies for GC/chlamydia  - F/u results to see if treatment is needed - F/u with Cathlyn on 12/11/23  Zev Blue L. Ahlivia Salahuddin, PharmD, BCIDP, AAHIVP, CPP Clinical Pharmacist Practitioner - Infectious Diseases Clinical Pharmacist Lead - Specialty Pharmacy [facility] for Infectious Disease

## 2023-12-08 LAB — CYTOLOGY, (ORAL, ANAL, URETHRAL) ANCILLARY ONLY
Chlamydia: NEGATIVE
Chlamydia: NEGATIVE
Comment: NEGATIVE
Comment: NEGATIVE
Comment: NORMAL
Comment: NORMAL
Neisseria Gonorrhea: NEGATIVE
Neisseria Gonorrhea: NEGATIVE

## 2023-12-08 LAB — URINE CYTOLOGY ANCILLARY ONLY
Chlamydia: NEGATIVE
Comment: NEGATIVE
Comment: NORMAL
Neisseria Gonorrhea: NEGATIVE

## 2023-12-11 ENCOUNTER — Ambulatory Visit (INDEPENDENT_AMBULATORY_CARE_PROVIDER_SITE_OTHER): Admitting: Family

## 2023-12-11 ENCOUNTER — Other Ambulatory Visit: Payer: Self-pay

## 2023-12-11 ENCOUNTER — Encounter: Payer: Self-pay | Admitting: Family

## 2023-12-11 VITALS — BP 114/77 | HR 65 | Temp 97.9°F | Ht 70.0 in | Wt 200.0 lb

## 2023-12-11 DIAGNOSIS — A630 Anogenital (venereal) warts: Secondary | ICD-10-CM | POA: Diagnosis not present

## 2023-12-11 DIAGNOSIS — Z23 Encounter for immunization: Secondary | ICD-10-CM | POA: Diagnosis present

## 2023-12-11 DIAGNOSIS — R21 Rash and other nonspecific skin eruption: Secondary | ICD-10-CM

## 2023-12-11 DIAGNOSIS — B181 Chronic viral hepatitis B without delta-agent: Secondary | ICD-10-CM | POA: Diagnosis not present

## 2023-12-11 DIAGNOSIS — R85610 Atypical squamous cells of undetermined significance on cytologic smear of anus (ASC-US): Secondary | ICD-10-CM | POA: Diagnosis not present

## 2023-12-11 DIAGNOSIS — Z0000 Encounter for general adult medical examination without abnormal findings: Secondary | ICD-10-CM

## 2023-12-11 DIAGNOSIS — B20 Human immunodeficiency virus [HIV] disease: Secondary | ICD-10-CM | POA: Diagnosis present

## 2023-12-11 NOTE — Assessment & Plan Note (Signed)
 Facial rash consistent with dermatitis secondary to new soap. Overall improved with no signs of anaphylaxis. Encouraged Dove products which are more natural moisturizers. Can use benedryl as needed for itching. No additional treatment needed at this time and follow up if symptoms do not continue to improve.

## 2023-12-11 NOTE — Assessment & Plan Note (Signed)
 Anal condyloma and requesting referral to General Surgery for removal. Referral placed.

## 2023-12-11 NOTE — Progress Notes (Signed)
 Brief Narrative   Patient ID: Daniel Dunlap, male    DOB: 04-15-1981, 43 y.o.   MRN: [id number]  Daniel Dunlap is a 43 y/o AA male diagnosed with AIDS/HIV in January 2009 with risk factor of MSM. Initial viral load was >100,000 and CD4 count 17. Cumulative Genosure with M184V (lamivudine and emtricitabine ) and E138A (prob. Rilpivirine). History of PJP at diagnosis. Coinfected with Hepatitis B. [id number] negative. Entered care at [facility] Stage 3. ART experience with Atripla, Darunavir /ritonavir , Tenofovir , Abacavir , and now Symtuza /Tivicay .   Subjective:   Chief Complaint  Patient presents with   Follow-up    B20    HPI:  Daniel Dunlap is a 43 y.o. male with HIV disease last seen on 11/10/23 with well controlled virus and good adherence and tolerance to Biktarvy  and Prezcoix. Viral load was undetectable and CD4 count 69.  Hepatitis B DNA level 48 with normal AST/ALT. Seen in the interim with concern for facial rash and possible STD. Testing remains pending. Here today for follow up.  Daniel Dunlap has been doing well since last office visit and has been taking his Biktarvy  and Symtuza  along with Bactrim  for OI prophylaxis with no adverse side effects or problems obtaining medication.  Covered by Medicaid.  Has concerns about a rash located on his face following using a new soap.  Described as itchy and improving since he stopped using the soap a couple days ago.  Has not attempted any additional treatments. Also has anal condyloma that he would like to have removed and is requesting referral to General Surgery.  Potentially changing jobs and has an interview today.  Housing, transportation, and access to food are stable.  Healthcare maintenance reviewed.  Condoms offered with site-specific STD testing performed earlier in the week.   Denies fevers, chills, night sweats, headaches, changes in vision, neck pain/stiffness, nausea, diarrhea, vomiting, or lesions.   Lab Results  Component Value Date    CD4TCELL 6 (L) 11/10/2023   CD4TABS 69 (L) 11/10/2023   Lab Results  Component Value Date   HIV1RNAQUANT <20 DETECTED (A) 11/10/2023     No Known Allergies    Outpatient Medications Prior to Visit  Medication Sig Dispense Refill   bictegravir-emtricitabine -tenofovir  AF (BIKTARVY ) 50-200-25 MG TABS tablet Take 1 tablet by mouth daily. 30 tablet 2   darunavir -cobicistat  (PREZCOBIX ) 800-150 MG tablet Take 1 tablet by mouth daily with breakfast. Swallow whole. Do NOT crush, break or chew tablets. Take with food. 30 tablet 2   sulfamethoxazole -trimethoprim  (BACTRIM  DS) 800-160 MG tablet Take 1 tablet by mouth daily. 30 tablet 2   No facility-administered medications prior to visit.     Past Medical History:  Diagnosis Date   Genital warts    HIV infection (HCC)    HPV (human papilloma virus) infection    Thyroid  disease      No past surgical history on file.      Review of Systems  Constitutional:  Negative for appetite change, chills, fatigue, fever and unexpected weight change.  Eyes:  Negative for visual disturbance.  Respiratory:  Negative for cough, chest tightness, shortness of breath and wheezing.   Cardiovascular:  Negative for chest pain and leg swelling.  Gastrointestinal:  Negative for abdominal pain, constipation, diarrhea, nausea and vomiting.  Genitourinary:  Negative for dysuria, flank pain, frequency, genital sores, hematuria and urgency.  Skin:  Negative for rash.  Allergic/Immunologic: Negative for immunocompromised state.  Neurological:  Negative for dizziness and headaches.     Objective:  BP 114/77   Pulse 65   Temp 97.9 F (36.6 C) (Temporal)   Ht 5' 10 (1.778 m)   Wt 200 lb (90.7 kg)   SpO2 99%   BMI 28.70 kg/m  Nursing note and vital signs reviewed.  Physical Exam Constitutional:      General: He is not in acute distress.    Appearance: He is well-developed.  Eyes:     Conjunctiva/sclera: Conjunctivae normal.  Cardiovascular:      Rate and Rhythm: Normal rate and regular rhythm.     Heart sounds: Normal heart sounds. No murmur heard.    No friction rub. No gallop.  Pulmonary:     Effort: Pulmonary effort is normal. No respiratory distress.     Breath sounds: Normal breath sounds. No wheezing or rales.  Chest:     Chest wall: No tenderness.  Abdominal:     General: Bowel sounds are normal.     Palpations: Abdomen is soft.     Tenderness: There is no abdominal tenderness.  Musculoskeletal:     Cervical back: Neck supple.  Lymphadenopathy:     Cervical: No cervical adenopathy.  Skin:    General: Skin is warm and dry.     Findings: Rash (facial rash) present.  Neurological:     Mental Status: He is alert and oriented to person, place, and time.  Psychiatric:        Mood and Affect: Mood normal.          12/11/2023    9:27 AM 03/23/2023   11:06 AM 08/12/2022    3:13 PM 12/13/2020   11:13 AM 09/11/2020    9:16 AM  Depression screen PHQ 2/9  Decreased Interest 0 0 0 0 0  Down, Depressed, Hopeless 0 0 0 0 0  PHQ - 2 Score 0 0 0 0 0  Altered sleeping 0      Tired, decreased energy 0      Change in appetite 0      Feeling bad or failure about yourself  0      Trouble concentrating 0      Moving slowly or fidgety/restless 0      Suicidal thoughts 0      PHQ-9 Score 0            12/11/2023    9:27 AM  GAD 7 : Generalized Anxiety Score  Nervous, Anxious, on Edge 0  Control/stop worrying 0  Worry too much - different things 0  Trouble relaxing 0  Restless 0  Easily annoyed or irritable 0  Afraid - awful might happen 0  Total GAD 7 Score 0        Assessment & Plan:    Patient Active Problem List   Diagnosis Date Noted   Anal condyloma 12/11/2023   Pap smear of anus with ASCUS 12/11/2023   Viral infection 08/13/2022   Cellulitis 12/17/2021   Cellulitis of abdominal wall 12/16/2021   Syncope 12/16/2021   Hyponatremia 12/16/2021   Hypokalemia 12/16/2021   Healthcare maintenance  05/24/2019   Rash 10/05/2018   Secondary syphilis 08/16/2018   Pain, dental 08/16/2018   Screening for STDs (sexually transmitted diseases) 04/26/2018   STI (sexually transmitted infection) 04/26/2018   Routine adult health maintenance 09/05/2016   AIDS (HCC) 07/12/2015   Late latent syphilis 07/12/2015   Chronic hepatitis B (HCC) 07/12/2015   Normocytic anemia 07/12/2015   Genital herpes 07/12/2015   Hyperthyroidism 06/27/2015   Abnormal liver function test  06/27/2015   Pneumocystis jiroveci pneumonia (HCC) 06/27/2015     Problem List Items Addressed This Visit       Digestive   Chronic hepatitis B (HCC)   Well-controlled with good adherence and tolerance to Biktarvy  and viral load of 48 and normal liver function tests with no signs/symptoms concerning for flare. Continue current dose of Biktarvy .      Anal condyloma   Anal condyloma and requesting referral to General Surgery for removal. Referral placed.       Relevant Orders   Ambulatory referral to General Surgery     Musculoskeletal and Integument   Rash   Facial rash consistent with dermatitis secondary to new soap. Overall improved with no signs of anaphylaxis. Encouraged Dove products which are more natural moisturizers. Can use benedryl as needed for itching. No additional treatment needed at this time and follow up if symptoms do not continue to improve.         Other   AIDS (HCC) - Primary   Daniel Dunlap has well controlled virus with good adherence and tolerance to Biktarvy  and Prezcobix . Reviewed lab work and discussed plan of care and importance of taking medication daily to maintain viral suppression and U equals U. Covered by Medicaid with no problems obtaining medication. Continue current dose of Biktarvy  and Prezcobix . Remains at risk for opportunistic infection and will need to continue with Bactrim . Plan for follow up in 2 months or sooner if needed with lab work on the same day.       Relevant Orders    MENINGOCOCCAL MCV4O (Completed)   Tdap vaccine greater than or equal to 7yo IM (Completed)   Ambulatory referral to Infectious Disease   Healthcare maintenance   Discussed importance of safe sexual practice and condom use. Condoms  offered.  Vaccinations reviewed and following counseling Tetanus and Menveo updated.  Due for routine dental care.      Pap smear of anus with ASCUS   Anal pap with ASCUS and high risk HPV positive previously referred to General Surgery and did not show. Will need HRA for additional evaluation.      Relevant Orders   Ambulatory referral to Infectious Disease   Other Visit Diagnoses       Need for Tdap vaccination       Relevant Orders   Tdap vaccine greater than or equal to 7yo IM (Completed)     Need for meningococcal vaccination       Relevant Orders   MENINGOCOCCAL MCV4O (Completed)       I am having Daniel Dunlap maintain his Biktarvy , Prezcobix , and sulfamethoxazole -trimethoprim .   Follow-up: Return in about 2 months (around 02/10/2024). or sooner if needed.    Cathlyn July, MSN, FNP-C Nurse Practitioner [facility] for Infectious Disease [facility] Main number: [phone]

## 2023-12-11 NOTE — Assessment & Plan Note (Signed)
 Discussed importance of safe sexual practice and condom use. Condoms  offered.  Vaccinations reviewed and following counseling Tetanus and Menveo updated.  Due for routine dental care.

## 2023-12-11 NOTE — Assessment & Plan Note (Signed)
 Daniel Dunlap has well controlled virus with good adherence and tolerance to Biktarvy  and Prezcobix . Reviewed lab work and discussed plan of care and importance of taking medication daily to maintain viral suppression and U equals U. Covered by Medicaid with no problems obtaining medication. Continue current dose of Biktarvy  and Prezcobix . Remains at risk for opportunistic infection and will need to continue with Bactrim . Plan for follow up in 2 months or sooner if needed with lab work on the same day.

## 2023-12-11 NOTE — Assessment & Plan Note (Signed)
 Well-controlled with good adherence and tolerance to Biktarvy  and viral load of 48 and normal liver function tests with no signs/symptoms concerning for flare. Continue current dose of Biktarvy .

## 2023-12-11 NOTE — Assessment & Plan Note (Signed)
 Anal pap with ASCUS and high risk HPV positive previously referred to General Surgery and did not show. Will need [facility] for additional evaluation.

## 2023-12-11 NOTE — Patient Instructions (Addendum)
Nice to see you.  Continue to take your medication daily as prescribed.  Refills have been sent to the pharmacy.  Plan for follow up in 2 months or sooner if needed with lab work on the same day.  Have a great day and stay safe!

## 2023-12-15 ENCOUNTER — Other Ambulatory Visit (HOSPITAL_COMMUNITY): Payer: Self-pay

## 2023-12-15 ENCOUNTER — Other Ambulatory Visit (HOSPITAL_BASED_OUTPATIENT_CLINIC_OR_DEPARTMENT_OTHER): Payer: Self-pay

## 2023-12-15 ENCOUNTER — Ambulatory Visit: Payer: Self-pay | Admitting: Pharmacist

## 2023-12-15 LAB — T PALLIDUM AB: T Pallidum Abs: POSITIVE — AB

## 2023-12-15 LAB — RPR: RPR Ser Ql: REACTIVE — AB

## 2023-12-15 LAB — RPR TITER: RPR Titer: 1:512 {titer} — ABNORMAL HIGH

## 2023-12-15 MED ORDER — DOXYCYCLINE HYCLATE 100 MG PO TABS
100.0000 mg | ORAL_TABLET | Freq: Two times a day (BID) | ORAL | 0 refills | Status: AC
  Filled 2023-12-15: qty 28, 14d supply, fill #0

## 2023-12-15 NOTE — Addendum Note (Signed)
 Addended by: Christalyn Goertz L on: 12/15/2023 03:22 PM   Modules accepted: Orders

## 2023-12-16 ENCOUNTER — Other Ambulatory Visit: Payer: Self-pay

## 2023-12-16 NOTE — Progress Notes (Signed)
 Specialty Pharmacy Refill Coordination Note  Daniel Dunlap is a 43 y.o. male contacted today regarding refills of specialty medication(s) Bictegravir-Emtricitab-Tenofov (Biktarvy ); Darunavir -Cobicistat  (Prezcobix )   Patient requested Delivery   Delivery date: 12/18/23   Verified address: [address] [place] [address]   Medication will be filled on 12/17/23.

## 2023-12-17 ENCOUNTER — Other Ambulatory Visit: Payer: Self-pay

## 2024-01-15 ENCOUNTER — Other Ambulatory Visit (HOSPITAL_COMMUNITY): Payer: Self-pay

## 2024-01-19 ENCOUNTER — Other Ambulatory Visit: Payer: Self-pay

## 2024-04-11 ENCOUNTER — Other Ambulatory Visit: Payer: Self-pay

## 2024-04-12 ENCOUNTER — Other Ambulatory Visit: Payer: Self-pay

## 2024-04-27 ENCOUNTER — Telehealth: Payer: Self-pay | Admitting: Pharmacist

## 2024-04-27 NOTE — Telephone Encounter (Signed)
 Patient is overdue for appointment with [facility]. He has not filled medicine with our pharmacy since August, and we have not been able to reach him over the phone or MyChart. Can you try to reach him?   Thank you so much!  Alan Geralds, PharmD, CPP, BCIDP, AAHIVP Clinical Pharmacist Practitioner Infectious Diseases Clinical Pharmacist [facility]

## 2024-04-29 NOTE — Telephone Encounter (Signed)
 Thank you so much

## 2024-05-06 ENCOUNTER — Other Ambulatory Visit: Payer: Self-pay

## 2024-05-06 DIAGNOSIS — Z113 Encounter for screening for infections with a predominantly sexual mode of transmission: Secondary | ICD-10-CM

## 2024-05-06 DIAGNOSIS — B20 Human immunodeficiency virus [HIV] disease: Secondary | ICD-10-CM

## 2024-05-09 ENCOUNTER — Other Ambulatory Visit: Payer: Self-pay

## 2024-05-10 ENCOUNTER — Other Ambulatory Visit (HOSPITAL_COMMUNITY)
Admission: RE | Admit: 2024-05-10 | Discharge: 2024-05-10 | Disposition: A | Discharge status: Home / Self Care | Source: Ambulatory Visit | Attending: Family | Admitting: Family

## 2024-05-10 ENCOUNTER — Other Ambulatory Visit

## 2024-05-10 ENCOUNTER — Other Ambulatory Visit: Payer: Self-pay

## 2024-05-10 DIAGNOSIS — Z113 Encounter for screening for infections with a predominantly sexual mode of transmission: Secondary | ICD-10-CM | POA: Diagnosis present

## 2024-05-10 DIAGNOSIS — B20 Human immunodeficiency virus [HIV] disease: Secondary | ICD-10-CM

## 2024-05-11 LAB — T-HELPER CELL (CD4) - (RCID CLINIC ONLY)
CD4 % Helper T Cell: 5 % — ABNORMAL LOW (ref 33–65)
CD4 T Cell Abs: 64 /uL — ABNORMAL LOW (ref 400–1790)

## 2024-05-12 LAB — URINE CYTOLOGY ANCILLARY ONLY
Chlamydia: NEGATIVE
Comment: NEGATIVE
Comment: NORMAL
Neisseria Gonorrhea: NEGATIVE

## 2024-05-16 ENCOUNTER — Other Ambulatory Visit: Payer: Self-pay | Admitting: Pharmacist

## 2024-05-16 DIAGNOSIS — A539 Syphilis, unspecified: Secondary | ICD-10-CM

## 2024-05-17 ENCOUNTER — Other Ambulatory Visit: Payer: Self-pay

## 2024-05-17 ENCOUNTER — Other Ambulatory Visit (HOSPITAL_COMMUNITY): Payer: Self-pay

## 2024-05-17 LAB — LIPID PANEL
Cholesterol: 144 mg/dL
HDL: 55 mg/dL
LDL Cholesterol (Calc): 68 mg/dL
Non-HDL Cholesterol (Calc): 89 mg/dL
Total CHOL/HDL Ratio: 2.6 (calc)
Triglycerides: 119 mg/dL

## 2024-05-17 LAB — COMPLETE METABOLIC PANEL WITHOUT GFR
AG Ratio: 1 (calc) (ref 1.0–2.5)
ALT: 11 U/L (ref 9–46)
AST: 19 U/L (ref 10–40)
Albumin: 4.1 g/dL (ref 3.6–5.1)
Alkaline phosphatase (APISO): 71 U/L (ref 36–130)
BUN: 15 mg/dL (ref 7–25)
CO2: 28 mmol/L (ref 20–32)
Calcium: 9 mg/dL (ref 8.6–10.3)
Chloride: 101 mmol/L (ref 98–110)
Creat: 0.83 mg/dL (ref 0.60–1.29)
Globulin: 4 g/dL — ABNORMAL HIGH (ref 1.9–3.7)
Glucose, Bld: 83 mg/dL (ref 65–99)
Potassium: 3.8 mmol/L (ref 3.5–5.3)
Sodium: 134 mmol/L — ABNORMAL LOW (ref 135–146)
Total Bilirubin: 0.3 mg/dL (ref 0.2–1.2)
Total Protein: 8.1 g/dL (ref 6.1–8.1)

## 2024-05-17 LAB — HIV-1 RNA QUANT-NO REFLEX-BLD
HIV 1 RNA Quant: 9260 {copies}/mL — ABNORMAL HIGH
HIV-1 RNA Quant, Log: 3.97 {Log_copies}/mL — ABNORMAL HIGH

## 2024-05-17 LAB — CBC WITH DIFFERENTIAL/PLATELET
Absolute Lymphocytes: 1578 {cells}/uL (ref 850–3900)
Absolute Monocytes: 525 {cells}/uL (ref 200–950)
Basophils Absolute: 9 {cells}/uL (ref 0–200)
Basophils Relative: 0.2 %
Eosinophils Absolute: 82 {cells}/uL (ref 15–500)
Eosinophils Relative: 1.9 %
HCT: 41 % (ref 39.4–51.1)
Hemoglobin: 13.4 g/dL (ref 13.2–17.1)
MCH: 28.2 pg (ref 27.0–33.0)
MCHC: 32.7 g/dL (ref 31.6–35.4)
MCV: 86.3 fL (ref 81.4–101.7)
MPV: 12.1 fL (ref 7.5–12.5)
Monocytes Relative: 12.2 %
Neutro Abs: 2107 {cells}/uL (ref 1500–7800)
Neutrophils Relative %: 49 %
Platelets: 235 Thousand/uL (ref 140–400)
RBC: 4.75 Million/uL (ref 4.20–5.80)
RDW: 13.2 % (ref 11.0–15.0)
Total Lymphocyte: 36.7 %
WBC: 4.3 Thousand/uL (ref 3.8–10.8)

## 2024-05-17 LAB — RPR TITER: RPR Titer: 1:32 {titer} — ABNORMAL HIGH

## 2024-05-17 LAB — SYPHILIS: RPR W/REFLEX TO RPR TITER AND TREPONEMAL ANTIBODIES, TRADITIONAL SCREENING AND DIAGNOSIS ALGORITHM: RPR Ser Ql: REACTIVE — AB

## 2024-05-17 LAB — T PALLIDUM AB: T Pallidum Abs: POSITIVE — AB

## 2024-05-20 ENCOUNTER — Other Ambulatory Visit: Payer: Self-pay | Admitting: Pharmacist

## 2024-05-20 DIAGNOSIS — A539 Syphilis, unspecified: Secondary | ICD-10-CM

## 2024-05-20 DIAGNOSIS — B20 Human immunodeficiency virus [HIV] disease: Secondary | ICD-10-CM

## 2024-05-23 ENCOUNTER — Ambulatory Visit: Admitting: Family

## 2024-05-24 ENCOUNTER — Other Ambulatory Visit: Payer: Self-pay

## 2024-05-24 MED ORDER — PREZCOBIX 800-150 MG PO TABS
1.0000 | ORAL_TABLET | Freq: Every day | ORAL | 2 refills | Status: AC
  Filled 2024-05-24: qty 30, 30d supply, fill #0

## 2024-05-24 MED ORDER — BIKTARVY 50-200-25 MG PO TABS
1.0000 | ORAL_TABLET | Freq: Every day | ORAL | 2 refills | Status: AC
  Filled 2024-05-24: qty 30, 30d supply, fill #0

## 2024-05-24 MED ORDER — SULFAMETHOXAZOLE-TRIMETHOPRIM 800-160 MG PO TABS: 1.0000 | ORAL_TABLET | Freq: Every day | ORAL | 2 refills | Status: AC

## 2024-05-24 NOTE — Telephone Encounter (Signed)
 Patient completed treatment.   Hallie Ishida, BSN, RN

## 2024-05-25 ENCOUNTER — Other Ambulatory Visit (HOSPITAL_COMMUNITY): Payer: Self-pay
